# Patient Record
Sex: Male | Born: 1965 | Race: White | Hispanic: No | Marital: Married | Smoking: Former smoker
Health system: Southern US, Community
[De-identification: ages and names within clinical notes are randomized; demographics above are authoritative.]

## PROBLEM LIST (undated history)

## (undated) DIAGNOSIS — E7800 Pure hypercholesterolemia, unspecified: Secondary | ICD-10-CM

## (undated) DIAGNOSIS — T753XXA Motion sickness, initial encounter: Secondary | ICD-10-CM

## (undated) DIAGNOSIS — N200 Calculus of kidney: Secondary | ICD-10-CM

## (undated) DIAGNOSIS — M25519 Pain in unspecified shoulder: Secondary | ICD-10-CM

## (undated) DIAGNOSIS — G629 Polyneuropathy, unspecified: Secondary | ICD-10-CM

## (undated) DIAGNOSIS — F419 Anxiety disorder, unspecified: Secondary | ICD-10-CM

## (undated) DIAGNOSIS — Z87442 Personal history of urinary calculi: Secondary | ICD-10-CM

## (undated) DIAGNOSIS — M1990 Unspecified osteoarthritis, unspecified site: Secondary | ICD-10-CM

## (undated) DIAGNOSIS — I10 Essential (primary) hypertension: Secondary | ICD-10-CM

## (undated) DIAGNOSIS — J45909 Unspecified asthma, uncomplicated: Secondary | ICD-10-CM

## (undated) HISTORY — DX: Pain in unspecified shoulder: M25.519

## (undated) HISTORY — PX: JOINT REPLACEMENT: SHX530

## (undated) HISTORY — PX: TOTAL SHOULDER ARTHROPLASTY: SHX126

---

## 1999-03-31 ENCOUNTER — Encounter: Payer: Self-pay | Admitting: Neurosurgery

## 1999-04-01 ENCOUNTER — Inpatient Hospital Stay (HOSPITAL_COMMUNITY): Admission: RE | Admit: 1999-04-01 | Discharge: 1999-04-02 | Payer: Self-pay | Admitting: Neurosurgery

## 1999-04-01 ENCOUNTER — Encounter: Payer: Self-pay | Admitting: Neurosurgery

## 1999-08-03 ENCOUNTER — Encounter: Admission: RE | Admit: 1999-08-03 | Discharge: 1999-08-03 | Payer: Self-pay | Admitting: Neurosurgery

## 1999-08-03 ENCOUNTER — Encounter: Payer: Self-pay | Admitting: Neurosurgery

## 1999-09-20 HISTORY — PX: CERVICAL FUSION: SHX112

## 1999-11-05 ENCOUNTER — Encounter: Payer: Self-pay | Admitting: Neurosurgery

## 1999-11-05 ENCOUNTER — Encounter: Admission: RE | Admit: 1999-11-05 | Discharge: 1999-11-05 | Payer: Self-pay | Admitting: Neurosurgery

## 2000-04-11 ENCOUNTER — Ambulatory Visit (HOSPITAL_COMMUNITY): Admission: RE | Admit: 2000-04-11 | Discharge: 2000-04-11 | Payer: Self-pay | Admitting: Neurosurgery

## 2000-04-11 ENCOUNTER — Encounter: Payer: Self-pay | Admitting: Neurosurgery

## 2000-09-19 HISTORY — PX: LITHOTRIPSY: SUR834

## 2000-09-30 ENCOUNTER — Encounter: Payer: Self-pay | Admitting: Neurosurgery

## 2000-09-30 ENCOUNTER — Ambulatory Visit (HOSPITAL_COMMUNITY): Admission: RE | Admit: 2000-09-30 | Discharge: 2000-09-30 | Payer: Self-pay | Admitting: Neurosurgery

## 2003-09-20 DIAGNOSIS — K429 Umbilical hernia without obstruction or gangrene: Secondary | ICD-10-CM

## 2003-09-20 HISTORY — DX: Umbilical hernia without obstruction or gangrene: K42.9

## 2003-09-20 HISTORY — PX: HERNIA REPAIR: SHX51

## 2004-07-07 ENCOUNTER — Emergency Department: Payer: Self-pay | Admitting: Emergency Medicine

## 2005-07-13 ENCOUNTER — Emergency Department: Payer: Self-pay | Admitting: Emergency Medicine

## 2007-03-06 ENCOUNTER — Ambulatory Visit: Payer: Self-pay | Admitting: Emergency Medicine

## 2008-02-20 ENCOUNTER — Emergency Department: Payer: Self-pay | Admitting: Emergency Medicine

## 2008-04-29 ENCOUNTER — Ambulatory Visit: Payer: Self-pay | Admitting: Otolaryngology

## 2008-10-23 ENCOUNTER — Emergency Department: Payer: Self-pay | Admitting: Emergency Medicine

## 2008-10-25 ENCOUNTER — Emergency Department: Payer: Self-pay | Admitting: Emergency Medicine

## 2010-09-19 HISTORY — PX: LIPOMA EXCISION: SHX5283

## 2011-12-26 DIAGNOSIS — N23 Unspecified renal colic: Secondary | ICD-10-CM | POA: Insufficient documentation

## 2012-04-11 DIAGNOSIS — D179 Benign lipomatous neoplasm, unspecified: Secondary | ICD-10-CM | POA: Insufficient documentation

## 2012-06-25 ENCOUNTER — Emergency Department: Payer: Self-pay | Admitting: Emergency Medicine

## 2013-01-07 ENCOUNTER — Emergency Department: Payer: Self-pay | Admitting: Emergency Medicine

## 2013-01-17 DIAGNOSIS — G894 Chronic pain syndrome: Secondary | ICD-10-CM | POA: Insufficient documentation

## 2013-01-17 DIAGNOSIS — N200 Calculus of kidney: Secondary | ICD-10-CM | POA: Insufficient documentation

## 2013-01-17 DIAGNOSIS — R479 Unspecified speech disturbances: Secondary | ICD-10-CM | POA: Insufficient documentation

## 2013-01-28 DIAGNOSIS — M7520 Bicipital tendinitis, unspecified shoulder: Secondary | ICD-10-CM | POA: Insufficient documentation

## 2013-01-28 DIAGNOSIS — M7550 Bursitis of unspecified shoulder: Secondary | ICD-10-CM | POA: Insufficient documentation

## 2013-02-10 ENCOUNTER — Emergency Department: Payer: Self-pay | Admitting: Emergency Medicine

## 2013-02-26 ENCOUNTER — Ambulatory Visit: Payer: Self-pay

## 2013-03-07 ENCOUNTER — Ambulatory Visit: Payer: Self-pay | Admitting: Urology

## 2013-08-29 DIAGNOSIS — M792 Neuralgia and neuritis, unspecified: Secondary | ICD-10-CM | POA: Insufficient documentation

## 2014-03-20 DIAGNOSIS — M19111 Post-traumatic osteoarthritis, right shoulder: Secondary | ICD-10-CM | POA: Insufficient documentation

## 2014-09-19 HISTORY — PX: JOINT REPLACEMENT: SHX530

## 2014-11-12 ENCOUNTER — Ambulatory Visit: Payer: Self-pay | Admitting: Surgery

## 2014-12-11 ENCOUNTER — Ambulatory Visit: Payer: Self-pay | Admitting: Surgery

## 2014-12-11 LAB — URINALYSIS, COMPLETE
Bacteria: NONE SEEN
Bilirubin,UR: NEGATIVE
Blood: NEGATIVE
Glucose,UR: NEGATIVE mg/dL (ref 0–75)
Ketone: NEGATIVE
Leukocyte Esterase: NEGATIVE
Nitrite: NEGATIVE
Ph: 6 (ref 4.5–8.0)
Protein: NEGATIVE
RBC,UR: 1 /HPF (ref 0–5)
Specific Gravity: 1.013 (ref 1.003–1.030)
Squamous Epithelial: NONE SEEN
WBC UR: NONE SEEN /HPF (ref 0–5)

## 2014-12-11 LAB — PROTIME-INR
INR: 0.9
PROTHROMBIN TIME: 12.1 s

## 2014-12-11 LAB — BASIC METABOLIC PANEL
Anion Gap: 11 (ref 7–16)
BUN: 15 mg/dL
CREATININE: 0.87 mg/dL
Calcium, Total: 9.7 mg/dL
Chloride: 101 mmol/L
Co2: 28 mmol/L
GLUCOSE: 113 mg/dL — AB
Potassium: 3.2 mmol/L — ABNORMAL LOW
Sodium: 140 mmol/L

## 2014-12-11 LAB — CBC
HCT: 43.8 % (ref 40.0–52.0)
HGB: 14.6 g/dL (ref 13.0–18.0)
MCH: 31.8 pg (ref 26.0–34.0)
MCHC: 33.3 g/dL (ref 32.0–36.0)
MCV: 95 fL (ref 80–100)
Platelet: 244 10*3/uL (ref 150–440)
RBC: 4.59 10*6/uL (ref 4.40–5.90)
RDW: 13.1 % (ref 11.5–14.5)
WBC: 5.9 10*3/uL (ref 3.8–10.6)

## 2014-12-11 LAB — MRSA PCR SCREENING

## 2014-12-19 HISTORY — PX: TOTAL SHOULDER ARTHROPLASTY: SHX126

## 2014-12-25 ENCOUNTER — Inpatient Hospital Stay
Admit: 2014-12-25 | Disposition: A | Discharge status: Home / Self Care | Payer: Self-pay | Attending: Surgery | Admitting: Surgery

## 2014-12-25 DIAGNOSIS — Z96611 Presence of right artificial shoulder joint: Secondary | ICD-10-CM | POA: Insufficient documentation

## 2014-12-25 HISTORY — PX: SHOULDER SURGERY: SHX246

## 2014-12-26 LAB — BASIC METABOLIC PANEL
Anion Gap: 6 — ABNORMAL LOW (ref 7–16)
BUN: 16 mg/dL
CO2: 29 mmol/L
CREATININE: 0.89 mg/dL
Calcium, Total: 8.3 mg/dL — ABNORMAL LOW
Chloride: 103 mmol/L
EGFR (African American): >60
EGFR (Non-African Amer.): >60
GLUCOSE: 139 mg/dL — AB
Potassium: 3.4 mmol/L — ABNORMAL LOW
Sodium: 138 mmol/L

## 2014-12-26 LAB — CBC WITH DIFFERENTIAL/PLATELET
BASOS ABS: 0 10*3/uL (ref 0.0–0.1)
Basophil %: 0.1 %
EOS ABS: 0 10*3/uL (ref 0.0–0.7)
Eosinophil %: 0.1 %
HCT: 35.6 % — ABNORMAL LOW (ref 40.0–52.0)
HGB: 11.9 g/dL — AB (ref 13.0–18.0)
LYMPHS ABS: 1.6 10*3/uL (ref 1.0–3.6)
LYMPHS PCT: 12.4 %
MCH: 31.9 pg (ref 26.0–34.0)
MCHC: 33.4 g/dL (ref 32.0–36.0)
MCV: 96 fL (ref 80–100)
Monocyte #: 0.9 x10 3/mm (ref 0.2–1.0)
Monocyte %: 6.8 %
NEUTROS PCT: 80.6 %
Neutrophil #: 10.5 10*3/uL — ABNORMAL HIGH (ref 1.4–6.5)
Platelet: 190 10*3/uL (ref 150–440)
RBC: 3.73 10*6/uL — ABNORMAL LOW (ref 4.40–5.90)
RDW: 13.1 % (ref 11.5–14.5)
WBC: 13 10*3/uL — ABNORMAL HIGH (ref 3.8–10.6)

## 2015-01-12 LAB — SURGICAL PATHOLOGY

## 2015-01-18 NOTE — Op Note (Signed)
PATIENT NAME:  Stephen Ray, Stephen Ray MR#:  [id number] DATE OF BIRTH:  [birth year]  DATE OF PROCEDURE:  12/25/2014  PREOPERATIVE DIAGNOSIS: Advanced degenerative joint disease, right shoulder.   POSTOPERATIVE DIAGNOSIS:   Advanced degenerative joint disease, right shoulder.    PROCEDURE: Right shoulder hemiarthroplasty with curettage/debridement of glenoid cyst, right shoulder.   SURGEON:  Pascal Lux, MD   ASSISTANT: Reche Dixon, PA-C   ANESTHESIA: General endotracheal with an interscalene block placed preoperative by the anesthesiologist.   FINDINGS: As noted above.  The glenoid was eburnated but had a smooth concentric curvature. There was significant labral degenerative tearing which was debrided. The rotator cuff itself was in excellent condition.   COMPLICATIONS: None.   ESTIMATED BLOOD LOSS: 150 mL.   TOTAL FLUIDS: 1400 mL of crystalloid.   URINE OUTPUT: 300 mL.   TOURNIQUET: None.   DRAINS: None.   CLOSURE: Staples.   BRIEF CLINICAL NOTE: The patient is a 49 year old male with an 8-10 year history of progressively worsening right shoulder pain. His symptoms have persisted despite medications, activity modification, injections, therapy, etc. His history and examination were consistent with significant degenerative joint disease, confirmed by both plain radiographs and an MRI scan. The MRI scan showed no significant partial or full-thickness rotator cuff tears. He presents at this time for a right shoulder hemiarthroplasty with possible reaming/microfracturing of the glenoid.  PROCEDURE IN DETAIL: The patient underwent placement of an interscalene block in the preoperative holding area. He was then brought into the operating room and lain in the supine position. After adequate general endotracheal intubation and anesthesia were obtained, the patient was repositioned in the beach chair position using the beach chair positioner. The right shoulder and upper extremity were prepped  with ChloraPrep solution before being draped sterilely. Preoperative antibiotics were administered. The standard anterior approach to the shoulder was made through an approximately 4 to  5 inch incision. This incision began at the coracoid process and extended distally and laterally in line with the anterior margin of the deltoid. The incision was carried down through the subcutaneous tissues to expose the deltopectoral fascia. The interval between the deltoid and pectoralis muscles was developed. The  cephalic vein was identified and retracted laterally with the deltoid. The conjoined tendon was identified and retracted medially to expose the anterior aspect of the shoulder. The "three sisters" were identified and cauterized before the subscapularis tendon was released approximately 1 cm from its attachment to the lesser tuberosity. Several tagging sutures were placed in the tendon before it was retracted medially to better expose the joint. Extensive capsular releases were performed superiorly, anteriorly, and inferiorly. Care was taken to avoid injury to the axillary nerve. The glenoid was carefully inspected with the findings as described above. Both visually and by palpation, it appeared quite smooth and had a single radius of curvature. The areas of degenerative tearing of the labrum were debrided back to stable margins using a #15 blade or Metzenbaum scissors. Inferiorly, there was a cyst in the glenoid measuring approximately 8 x 9 mm, which was curetted out. As it was noncontained, it was elected not to proceed with bone grafting. Because of the difficulty with exposure, it was felt best to proceed with a micro stem for the proximal humerus rather than a humeral resurfacing procedure. The external guide was positioned and the appropriate proximal humerus cut made. This piece was taken to the back table, where it was measured and found to be  optimally replicated by a 46 x  21 mm humeral head. After removal  of the humeral head, the glenoid could be better exposed, permitting  completion of the glenoid preparation.   Attention was directed to the humeral side. The humeral canal was identified using a narrow tapered reamer inserted just posterior to the bicipital groove. The canal was reamed sequentially to a 17 mm diameter reamer. The canal was then broached sequentially beginning with a #14 broach and progressing to a #17 broach. This was left in place and a trial reduction performed using the 46 x 21 mm head. This demonstrated excellent stability to anterior and posterior displacement, as well as to abduction and external rotation, adduction across the chest to reach the opposite shoulder, and full internal rotation. The patient's hand also could easily reach his head and across his face.  There was no excess laxity in the shoulder. The trial components were removed and the permanent #17 stem impacted into place with care taken to maintain the appropriate retroversion. Of note, all of the cuts and broaching was done with approximately 30 degrees of retroversion. The permanent head and adapter construct was put together on the back table before it too was impacted into place. The Morse taper locking mechanism was verified using manual traction and found to be excellent. The  head was relocated and the shoulder again placed through a range of motion with the findings as described above. No crepitus or clunking was noted. The wound was copiously irrigated with bacitracin saline solution via the jet lavage system. The periarticular tissues and peri-incisional tissues were debrided using 30 mL of 0.5% Sensorcaine and 20 mL of Exparel diluted out to 60 mL with normal saline in order to help with postoperative analgesia. The subscapularis tendon was reapproximated using #2 FiberWire interrupted sutures. The deltopectoral interval was reapproximated using 2-0 Vicryl interrupted sutures before 1 gm of tranexamic acid in 10  mL of normal saline was injected intra-articularly. The subcutaneous tissues were closed in 2 layers using 2-0 Vicryl interrupted sutures before the skin was closed using staples. A sterile occlusive dressing was applied to the shoulder before the arm was placed into a shoulder immobilizer. A Polar Pack also was applied to the shoulder. The  patient was then awakened, extubated, and returned to the recovery room in satisfactory condition after tolerating the procedure well. Of note, my assistant was present for the entire case. His presence was critical for assistance with exposure and arm positioning in order to optimize the placement of the components.    ____________________________ J. Dorien Chihuahua, MD jjp:tr D: 12/25/2014 13:16:05 ET T: 12/25/2014 16:00:00 ET JOB#: [id number]  cc: Pascal Lux, MD, <Dictator> Pascal Lux MD ELECTRONICALLY SIGNED 01/06/2015 14:44

## 2015-02-21 ENCOUNTER — Other Ambulatory Visit: Payer: Self-pay | Admitting: Family Medicine

## 2015-03-05 ENCOUNTER — Telehealth: Payer: Self-pay | Admitting: Family Medicine

## 2015-03-05 NOTE — Telephone Encounter (Signed)
Pt called requesting to speak to the nurse concerning medication changes.

## 2015-03-05 NOTE — Telephone Encounter (Signed)
Would like to stop Requip and go back on Gabapentin and Duloxitine. Restless leg is done and he does not need that med but needs the others to be reinstated for now. [facility]

## 2015-03-06 NOTE — Telephone Encounter (Signed)
I probably need to see him to figure this out.  Cannot just restart both at the same time. Restless leg syndrome just doesn't go away.  Figure that he figured that he really didn't really have restless leg problems.

## 2015-03-06 NOTE — Telephone Encounter (Signed)
No answer.[facility]

## 2015-03-10 ENCOUNTER — Other Ambulatory Visit: Payer: Self-pay | Admitting: Surgery

## 2015-03-10 DIAGNOSIS — M19012 Primary osteoarthritis, left shoulder: Secondary | ICD-10-CM

## 2015-03-10 NOTE — Telephone Encounter (Signed)
I have called a few times and can not reach this patient. I am unable to leave a message. Will await a call back. [facility]

## 2015-03-16 ENCOUNTER — Ambulatory Visit
Admission: RE | Admit: 2015-03-16 | Discharge: 2015-03-16 | Disposition: A | Discharge status: Home / Self Care | Payer: BC Managed Care – PPO | Source: Ambulatory Visit | Attending: Surgery | Admitting: Surgery

## 2015-03-16 DIAGNOSIS — M19012 Primary osteoarthritis, left shoulder: Secondary | ICD-10-CM | POA: Diagnosis not present

## 2015-03-16 DIAGNOSIS — M67814 Other specified disorders of tendon, left shoulder: Secondary | ICD-10-CM | POA: Diagnosis not present

## 2015-03-16 DIAGNOSIS — M24012 Loose body in left shoulder: Secondary | ICD-10-CM | POA: Insufficient documentation

## 2015-03-16 DIAGNOSIS — M25412 Effusion, left shoulder: Secondary | ICD-10-CM | POA: Insufficient documentation

## 2015-03-18 DIAGNOSIS — M7021 Olecranon bursitis, right elbow: Secondary | ICD-10-CM | POA: Insufficient documentation

## 2015-03-18 DIAGNOSIS — M19112 Post-traumatic osteoarthritis, left shoulder: Secondary | ICD-10-CM | POA: Insufficient documentation

## 2015-03-20 ENCOUNTER — Ambulatory Visit: Payer: Self-pay | Admitting: Family Medicine

## 2015-03-30 ENCOUNTER — Encounter: Payer: Self-pay | Admitting: Family Medicine

## 2015-03-30 ENCOUNTER — Ambulatory Visit (INDEPENDENT_AMBULATORY_CARE_PROVIDER_SITE_OTHER): Payer: BC Managed Care – PPO | Admitting: Family Medicine

## 2015-03-30 VITALS — BP 130/80 | HR 65 | Resp 16 | Ht 70.0 in | Wt 206.0 lb

## 2015-03-30 DIAGNOSIS — Z9889 Other specified postprocedural states: Secondary | ICD-10-CM | POA: Insufficient documentation

## 2015-03-30 DIAGNOSIS — F419 Anxiety disorder, unspecified: Secondary | ICD-10-CM

## 2015-03-30 DIAGNOSIS — I10 Essential (primary) hypertension: Secondary | ICD-10-CM

## 2015-03-30 DIAGNOSIS — M069 Rheumatoid arthritis, unspecified: Secondary | ICD-10-CM | POA: Insufficient documentation

## 2015-03-30 DIAGNOSIS — M792 Neuralgia and neuritis, unspecified: Secondary | ICD-10-CM

## 2015-03-30 DIAGNOSIS — F1110 Opioid abuse, uncomplicated: Secondary | ICD-10-CM | POA: Insufficient documentation

## 2015-03-30 DIAGNOSIS — N200 Calculus of kidney: Secondary | ICD-10-CM | POA: Insufficient documentation

## 2015-03-30 DIAGNOSIS — M19011 Primary osteoarthritis, right shoulder: Secondary | ICD-10-CM

## 2015-03-30 MED ORDER — DULOXETINE HCL 30 MG PO CPEP: 30.0000 mg | ORAL_CAPSULE | Freq: Every day | ORAL | Status: DC

## 2015-03-30 NOTE — Patient Instructions (Signed)
Cont. To see Ortho re: shoulder problems and his pain medications.

## 2015-03-30 NOTE — Progress Notes (Signed)
Name: Stephen Ray   MRN: [id number]    DOB: Apr 14, 1966   Date:03/30/2015       Progress Note  Subjective  Chief Complaint  Chief Complaint  Patient presents with  . Shoulder Pain    Following for opiate withdrawl and restless leg: Patient reports having taken 4mg  Suboxone to get done with withdrawl and had zero leg cramps.     HPI  Here for f/u.  Did not take Requip after several days.  Did no good.  Got some Suboxone from a "friend" and the nervous legs went away.  Now only on Tramadol bid and Etodolac and Gabapentin 300 mg bid.  Also has HBP.  Has some chronic pain in L. Shoulder.  In need of that shouder surgery also. S/p R. Total shoulder repair (12/25/14).  Past Medical History  Diagnosis Date  . Shoulder pain     left and right    History  Substance Use Topics  . Smoking status: Never Smoker   . Smokeless tobacco: Never Used  . Alcohol Use: 0.0 oz/week    0 Standard drinks or equivalent per week     Current outpatient prescriptions:  .  chlorthalidone (HYGROTON) 25 MG tablet, Take by mouth., Disp: , Rfl:  .  gabapentin (NEURONTIN) 300 MG capsule, Take one capsule by mouth 3 times a day. May increase to 2 tablets at night as needed., Disp: , Rfl:  .  lidocaine (LIDODERM) 5 %, Place onto the skin., Disp: , Rfl:  .  lisinopril (PRINIVIL,ZESTRIL) 20 MG tablet, Take by mouth., Disp: , Rfl:  .  traMADol (ULTRAM) 50 MG tablet, Take by mouth., Disp: , Rfl:  .  DULoxetine (CYMBALTA) 30 MG capsule, Take 1 capsule (30 mg total) by mouth daily., Disp: 30 capsule, Rfl: 6 .  etodolac (LODINE) 500 MG tablet, Take by mouth., Disp: , Rfl:  .  naproxen sodium (ANAPROX) 550 MG tablet, Take by mouth., Disp: , Rfl:  .  oxyCODONE (OXY IR/ROXICODONE) 5 MG immediate release tablet, 1 p.o. q.8 hours p.r.n. pain.  Max = 3/day., Disp: , Rfl:  .  oxyCODONE-acetaminophen (PERCOCET/ROXICET) 5-325 MG per tablet, 1 or 2 p.o. q.6 hours p.r.n. pain, Disp: , Rfl:  .  rOPINIRole (REQUIP) 0.25 MG tablet,  Take by mouth., Disp: , Rfl:   Allergies  Allergen Reactions  . Shellfish Allergy Anaphylaxis  . Shellfish-Derived Products Anaphylaxis    Review of Systems  Constitutional: Negative.  Negative for fever and chills.  Eyes: Negative.  Negative for blurred vision and double vision.  Respiratory: Negative for cough, sputum production, shortness of breath and wheezing.   Cardiovascular: Negative for chest pain, palpitations, orthopnea and claudication.  Gastrointestinal: Negative.  Negative for heartburn, nausea, vomiting, abdominal pain, diarrhea, constipation and blood in stool.  Genitourinary: Negative.  Negative for dysuria and frequency.  Musculoskeletal: Positive for joint pain (Bilateral shoulder pain).  Skin: Negative.  Negative for rash.  Neurological: Negative for headaches.      Objective  Filed Vitals:   03/30/15 1312 03/30/15 1343  BP: 147/96 130/80  Pulse: 65   Resp: 16   Height: 5\' 10"  (1.778 m)   Weight: 206 lb (93.441 kg)      Physical Exam  Constitutional: He is oriented to person, place, and time and well-developed, well-nourished, and in no distress.  HENT:  Head: Normocephalic and atraumatic.  Eyes: Conjunctivae and EOM are normal. Pupils are equal, round, and reactive to light.  Neck: Normal range of motion. No  thyromegaly present.  Cardiovascular: Normal rate, regular rhythm, normal heart sounds and intact distal pulses.  Exam reveals no gallop and no friction rub.   No murmur heard. Pulmonary/Chest: Effort normal and breath sounds normal. No respiratory distress. He has no wheezes. He has no rales.  Abdominal: Soft. Bowel sounds are normal. He exhibits no mass. There is no tenderness.  Musculoskeletal: He exhibits no edema.  Lymphadenopathy:    He has no cervical adenopathy.  Neurological: He is alert and oriented to person, place, and time.  Psychiatric:  Affect sl. Anxious.  Vitals reviewed.       Assessment & Plan   1. Essential  (primary) hypertension   2. Primary osteoarthritis of right shoulder   3. Neuropathic pain   4. Acute anxiety  -Start Duloxitine, 30 mg. 1 daily.

## 2015-05-26 ENCOUNTER — Encounter: Payer: Self-pay | Admitting: Family Medicine

## 2015-05-26 ENCOUNTER — Ambulatory Visit (INDEPENDENT_AMBULATORY_CARE_PROVIDER_SITE_OTHER): Payer: BC Managed Care – PPO | Admitting: Family Medicine

## 2015-05-26 VITALS — BP 150/95 | HR 69 | Temp 98.5°F | Resp 16 | Ht 70.0 in | Wt 205.0 lb

## 2015-05-26 DIAGNOSIS — Z23 Encounter for immunization: Secondary | ICD-10-CM | POA: Diagnosis not present

## 2015-05-26 DIAGNOSIS — I10 Essential (primary) hypertension: Secondary | ICD-10-CM | POA: Diagnosis not present

## 2015-05-26 MED ORDER — LISINOPRIL 30 MG PO TABS: 30.0000 mg | ORAL_TABLET | Freq: Every day | ORAL | Status: DC

## 2015-05-26 NOTE — Progress Notes (Signed)
Name: Stephen Ray   MRN: [id number]    DOB: [birth year]   Date:05/26/2015       Progress Note  Subjective  Chief Complaint  Chief Complaint  Patient presents with  . Hypertension    follow up for blood pressure 3 month     HPI  Here for f/u of HBP.  States that BP higher when he has more pain.  Taking meds daily. No problem-specific assessment & plan notes found for this encounter.   Past Medical History  Diagnosis Date  . Shoulder pain     left and right    Social History  Substance Use Topics  . Smoking status: Never Smoker   . Smokeless tobacco: Never Used  . Alcohol Use: 0.0 oz/week    0 Standard drinks or equivalent per week     Current outpatient prescriptions:  .  chlorthalidone (HYGROTON) 25 MG tablet, Take by mouth., Disp: , Rfl:  .  DULoxetine (CYMBALTA) 30 MG capsule, Take 1 capsule (30 mg total) by mouth daily., Disp: 30 capsule, Rfl: 6 .  gabapentin (NEURONTIN) 300 MG capsule, Take one capsule by mouth 3 times a day. May increase to 2 tablets at night as needed., Disp: , Rfl:  .  lidocaine (LIDODERM) 5 %, Place onto the skin., Disp: , Rfl:  .  lisinopril (PRINIVIL,ZESTRIL) 20 MG tablet, Take by mouth., Disp: , Rfl:   Allergies  Allergen Reactions  . Shellfish Allergy Anaphylaxis  . Shellfish-Derived Products Anaphylaxis    Review of Systems  Constitutional: Negative for fever and chills.  HENT: Negative for hearing loss.   Eyes: Negative for blurred vision and double vision.  Respiratory: Negative for cough, sputum production, shortness of breath and wheezing.   Cardiovascular: Negative for chest pain, palpitations and leg swelling.  Gastrointestinal: Negative for heartburn, nausea, vomiting, abdominal pain, diarrhea and blood in stool.  Genitourinary: Negative for dysuria, urgency and frequency.  Musculoskeletal: Positive for joint pain (bilateral shoulders.).  Skin: Negative for rash.  Neurological: Negative for dizziness, sensory change, focal  weakness and headaches.      Objective  Filed Vitals:   05/26/15 1305  BP: 157/115  Pulse: 69  Temp: 98.5 F (36.9 C)  TempSrc: Oral  Resp: 16  Height: 5\' 10"  (1.778 m)  Weight: 205 lb (92.987 kg)     Physical Exam  Constitutional: He is well-developed, well-nourished, and in no distress. No distress.  HENT:  Head: Normocephalic and atraumatic.  Eyes: Conjunctivae and EOM are normal. Pupils are equal, round, and reactive to light. No scleral icterus.  Neck: Normal range of motion. Neck supple. Carotid bruit is not present. No thyromegaly present.  Cardiovascular: Normal rate, regular rhythm, normal heart sounds and intact distal pulses.  Exam reveals no gallop and no friction rub.   No murmur heard. Pulmonary/Chest: Effort normal and breath sounds normal. No respiratory distress. He has no wheezes. He has no rales.  Abdominal: Soft. Bowel sounds are normal. He exhibits no distension, no abdominal bruit and no mass. There is no tenderness.  Musculoskeletal: He exhibits no edema.  Lymphadenopathy:    He has no cervical adenopathy.  Vitals reviewed.     No results found for this or any previous visit (from the past 2160 hour(s)).   Assessment & Plan  1. Essential (primary) hypertension  - lisinopril (PRINIVIL,ZESTRIL) 30 MG tablet; Take 1 tablet (30 mg total) by mouth daily.  Dispense: 30 tablet; Refill: 6  2. Need for influenza vaccination  -  Flu Vaccine QUAD 36+ mos PF IM (Fluarix & Fluzone Quad PF)

## 2015-05-26 NOTE — Patient Instructions (Signed)
Continue other meds as directed.

## 2015-08-04 ENCOUNTER — Other Ambulatory Visit: Payer: BC Managed Care – PPO

## 2015-08-05 ENCOUNTER — Encounter
Admission: RE | Admit: 2015-08-05 | Discharge: 2015-08-05 | Disposition: A | Discharge status: Home / Self Care | Payer: BC Managed Care – PPO | Source: Ambulatory Visit | Attending: Surgery | Admitting: Surgery

## 2015-08-05 DIAGNOSIS — Z01812 Encounter for preprocedural laboratory examination: Secondary | ICD-10-CM | POA: Diagnosis not present

## 2015-08-05 HISTORY — DX: Unspecified osteoarthritis, unspecified site: M19.90

## 2015-08-05 HISTORY — DX: Anxiety disorder, unspecified: F41.9

## 2015-08-05 HISTORY — DX: Essential (primary) hypertension: I10

## 2015-08-05 LAB — SURGICAL PCR SCREEN
MRSA, PCR: NEGATIVE
Staphylococcus aureus: NEGATIVE

## 2015-08-05 LAB — URINALYSIS COMPLETE WITH MICROSCOPIC (ARMC ONLY)
Bacteria, UA: NONE SEEN
Bilirubin Urine: NEGATIVE
Glucose, UA: NEGATIVE mg/dL
HGB URINE DIPSTICK: NEGATIVE
KETONES UR: NEGATIVE mg/dL
Leukocytes, UA: NEGATIVE
NITRITE: NEGATIVE
Protein, ur: NEGATIVE mg/dL
SPECIFIC GRAVITY, URINE: 1.014 (ref 1.005–1.030)
Squamous Epithelial / LPF: NONE SEEN
pH: 6 (ref 5.0–8.0)

## 2015-08-05 LAB — CBC
HCT: 42.6 % (ref 40.0–52.0)
HEMOGLOBIN: 14.6 g/dL (ref 13.0–18.0)
MCH: 32.1 pg (ref 26.0–34.0)
MCHC: 34.2 g/dL (ref 32.0–36.0)
MCV: 93.7 fL (ref 80.0–100.0)
Platelets: 237 10*3/uL (ref 150–440)
RBC: 4.54 MIL/uL (ref 4.40–5.90)
RDW: 13.4 % (ref 11.5–14.5)
WBC: 4.7 10*3/uL (ref 3.8–10.6)

## 2015-08-05 LAB — BASIC METABOLIC PANEL
Anion gap: 5 (ref 5–15)
BUN: 15 mg/dL (ref 6–20)
CO2: 31 mmol/L (ref 22–32)
CREATININE: 0.88 mg/dL (ref 0.61–1.24)
Calcium: 9.3 mg/dL (ref 8.9–10.3)
Chloride: 101 mmol/L (ref 101–111)
GFR calc non Af Amer: >60 mL/min (ref >60)
Glucose, Bld: 98 mg/dL (ref 65–99)
Potassium: 3.2 mmol/L — ABNORMAL LOW (ref 3.5–5.1)
SODIUM: 137 mmol/L (ref 135–145)

## 2015-08-05 LAB — TYPE AND SCREEN
ABO/RH(D): O POS
Antibody Screen: NEGATIVE

## 2015-08-05 LAB — PROTIME-INR
INR: 0.89
PROTHROMBIN TIME: 12.3 s (ref 11.4–15.0)

## 2015-08-05 LAB — ABO/RH: ABO/RH(D): O POS

## 2015-08-05 NOTE — Patient Instructions (Signed)
  Your procedure is scheduled on: Thursday 08/20/2015 Report to Day Surgery. 2ND FLOOR MEDICAL MALL ENTRANCE To find out your arrival time please call [phone] between 1PM - 3PM on Wednesday 08/19/2015.  Remember: Instructions that are not followed completely may result in serious medical risk, up to and including death, or upon the discretion of your surgeon and anesthesiologist your surgery may need to be rescheduled.    __X__ 1. Do not eat food or drink liquids after midnight. No gum chewing or hard candies.     __X__ 2. No Alcohol for 24 hours before or after surgery.   ____ 3. Bring all medications with you on the day of surgery if instructed.    __X_ 4. Notify your doctor if there is any change in your medical condition     (cold, fever, infections).     Do not wear jewelry, make-up, hairpins, clips or nail polish.  Do not wear lotions, powders, or perfumes.   Do not shave 48 hours prior to surgery. Men may shave face and neck.  Do not bring valuables to the hospital.    [facility] is not responsible for any belongings or valuables.               Contacts, dentures or bridgework may not be worn into surgery.  Leave your suitcase in the car. After surgery it may be brought to your room.  For patients admitted to the hospital, discharge time is determined by your                treatment team.   Patients discharged the day of surgery will not be allowed to drive home.   Please read over the following fact sheets that you were given:   MRSA Information and Surgical Site Infection Prevention   __X__ Take these medicines the morning of surgery with A SIP OF WATER:    1. CHLORTHALIDONE  2. DULOXETINE  3. GABAPENTIN  4. LISINOPRIL  5.  6.  ____ Fleet Enema (as directed)   __X__ Use CHG Soap as directed  ____ Use inhalers on the day of surgery  ____ Stop metformin 2 days prior to surgery    ____ Take 1/2 of usual insulin dose the night before surgery and none on the  morning of surgery.   ____ Stop Coumadin/Plavix/aspirin on  ____ Stop Anti-inflammatories on    ____ Stop supplements until after surgery.    ____ Bring C-Pap to the hospital.

## 2015-08-06 NOTE — Pre-Procedure Instructions (Signed)
Patient does not want a foley

## 2015-08-07 DIAGNOSIS — E876 Hypokalemia: Secondary | ICD-10-CM | POA: Insufficient documentation

## 2015-08-20 ENCOUNTER — Encounter
Admission: RE | Disposition: A | Discharge status: Home / Self Care | Payer: Self-pay | Source: Ambulatory Visit | Attending: Surgery

## 2015-08-20 ENCOUNTER — Inpatient Hospital Stay: Payer: BC Managed Care – PPO | Admitting: Anesthesiology

## 2015-08-20 ENCOUNTER — Inpatient Hospital Stay
Admission: RE | Admit: 2015-08-20 | Discharge: 2015-08-21 | DRG: 483 | Disposition: A | Discharge status: Home / Self Care | Payer: BC Managed Care – PPO | Source: Ambulatory Visit | Attending: Surgery | Admitting: Surgery

## 2015-08-20 ENCOUNTER — Inpatient Hospital Stay: Payer: BC Managed Care – PPO

## 2015-08-20 ENCOUNTER — Encounter: Payer: Self-pay | Admitting: *Deleted

## 2015-08-20 DIAGNOSIS — Z91013 Allergy to seafood: Secondary | ICD-10-CM

## 2015-08-20 DIAGNOSIS — Z79899 Other long term (current) drug therapy: Secondary | ICD-10-CM | POA: Diagnosis not present

## 2015-08-20 DIAGNOSIS — M19112 Post-traumatic osteoarthritis, left shoulder: Secondary | ICD-10-CM | POA: Diagnosis present

## 2015-08-20 DIAGNOSIS — G8929 Other chronic pain: Secondary | ICD-10-CM | POA: Diagnosis present

## 2015-08-20 DIAGNOSIS — Z96612 Presence of left artificial shoulder joint: Secondary | ICD-10-CM | POA: Insufficient documentation

## 2015-08-20 DIAGNOSIS — I10 Essential (primary) hypertension: Secondary | ICD-10-CM | POA: Diagnosis present

## 2015-08-20 HISTORY — PX: SHOULDER HEMI-ARTHROPLASTY: SHX5049

## 2015-08-20 LAB — POCT I-STAT 4, (NA,K, GLUC, HGB,HCT)
Glucose, Bld: 125 mg/dL — ABNORMAL HIGH (ref 65–99)
HEMATOCRIT: 46 % (ref 39.0–52.0)
HEMOGLOBIN: 15.6 g/dL (ref 13.0–17.0)
Potassium: 3.1 mmol/L — ABNORMAL LOW (ref 3.5–5.1)
Sodium: 139 mmol/L (ref 135–145)

## 2015-08-20 SURGERY — HEMIARTHROPLASTY, SHOULDER
Anesthesia: Regional | Site: Shoulder | Laterality: Left | Wound class: Clean

## 2015-08-20 MED ORDER — KETOROLAC TROMETHAMINE 15 MG/ML IJ SOLN
15.0000 mg | Freq: Four times a day (QID) | INTRAMUSCULAR | Status: DC
  Administered 2015-08-20 – 2015-08-21 (×2): 15 mg via INTRAVENOUS
  Filled 2015-08-20 (×2): qty 1

## 2015-08-20 MED ORDER — HYDROMORPHONE HCL 1 MG/ML IJ SOLN
INTRAMUSCULAR | Status: AC
  Filled 2015-08-20: qty 2

## 2015-08-20 MED ORDER — DOCUSATE SODIUM 100 MG PO CAPS
100.0000 mg | ORAL_CAPSULE | Freq: Two times a day (BID) | ORAL | Status: DC
  Administered 2015-08-20 – 2015-08-21 (×2): 100 mg via ORAL
  Filled 2015-08-20 (×2): qty 1

## 2015-08-20 MED ORDER — CEFAZOLIN SODIUM-DEXTROSE 2-3 GM-% IV SOLR
INTRAVENOUS | Status: AC
  Filled 2015-08-20: qty 50

## 2015-08-20 MED ORDER — HYDROMORPHONE HCL 1 MG/ML IJ SOLN
0.2500 mg | INTRAMUSCULAR | Status: DC | PRN
  Administered 2015-08-20 (×4): 0.5 mg via INTRAVENOUS

## 2015-08-20 MED ORDER — ACETAMINOPHEN 10 MG/ML IV SOLN
INTRAVENOUS | Status: AC
  Filled 2015-08-20: qty 100

## 2015-08-20 MED ORDER — FAMOTIDINE 20 MG PO TABS
20.0000 mg | ORAL_TABLET | Freq: Once | ORAL | Status: AC
  Administered 2015-08-20: 20 mg via ORAL

## 2015-08-20 MED ORDER — ACETAMINOPHEN 500 MG PO TABS
1000.0000 mg | ORAL_TABLET | Freq: Four times a day (QID) | ORAL | Status: DC
  Administered 2015-08-20 – 2015-08-21 (×3): 1000 mg via ORAL
  Filled 2015-08-20 (×3): qty 2

## 2015-08-20 MED ORDER — DIPHENHYDRAMINE HCL 12.5 MG/5ML PO ELIX: 12.5000 mg | ORAL_SOLUTION | ORAL | Status: DC | PRN

## 2015-08-20 MED ORDER — ACETAMINOPHEN 650 MG RE SUPP
650.0000 mg | Freq: Four times a day (QID) | RECTAL | Status: DC | PRN
Start: 2015-08-20 — End: 2015-08-21

## 2015-08-20 MED ORDER — FLEET ENEMA 7-19 GM/118ML RE ENEM: 1.0000 | ENEMA | Freq: Once | RECTAL | Status: DC | PRN

## 2015-08-20 MED ORDER — ENOXAPARIN SODIUM 40 MG/0.4ML ~~LOC~~ SOLN
40.0000 mg | SUBCUTANEOUS | Status: DC
  Administered 2015-08-21: 40 mg via SUBCUTANEOUS
  Filled 2015-08-20: qty 0.4

## 2015-08-20 MED ORDER — LIDOCAINE 5 % EX PTCH
1.0000 | MEDICATED_PATCH | CUTANEOUS | Status: DC
  Administered 2015-08-20: 1 via TRANSDERMAL
  Filled 2015-08-20 (×3): qty 1

## 2015-08-20 MED ORDER — BUPIVACAINE-EPINEPHRINE (PF) 0.5% -1:200000 IJ SOLN
INTRAMUSCULAR | Status: AC
  Filled 2015-08-20: qty 30

## 2015-08-20 MED ORDER — CEFAZOLIN SODIUM-DEXTROSE 2-3 GM-% IV SOLR
2.0000 g | Freq: Four times a day (QID) | INTRAVENOUS | Status: AC
  Administered 2015-08-20 – 2015-08-21 (×3): 2 g via INTRAVENOUS
  Filled 2015-08-20 (×3): qty 50

## 2015-08-20 MED ORDER — ROPIVACAINE HCL 5 MG/ML IJ SOLN
INTRAMUSCULAR | Status: AC
  Filled 2015-08-20: qty 40

## 2015-08-20 MED ORDER — HYDROMORPHONE HCL 1 MG/ML IJ SOLN
0.2500 mg | INTRAMUSCULAR | Status: DC | PRN
  Administered 2015-08-20 (×3): 0.5 mg via INTRAVENOUS
  Administered 2015-08-20 (×2): 0.25 mg via INTRAVENOUS

## 2015-08-20 MED ORDER — NEOMYCIN-POLYMYXIN B GU 40-200000 IR SOLN
Status: AC
  Filled 2015-08-20: qty 16

## 2015-08-20 MED ORDER — TRIAMCINOLONE ACETONIDE 40 MG/ML IJ SUSP
INTRAMUSCULAR | Status: AC
  Filled 2015-08-20: qty 1

## 2015-08-20 MED ORDER — FENTANYL CITRATE (PF) 250 MCG/5ML IJ SOLN
INTRAMUSCULAR | Status: DC | PRN
  Administered 2015-08-20 (×2): 50 ug via INTRAVENOUS
  Administered 2015-08-20: 100 ug via INTRAVENOUS
  Administered 2015-08-20: 50 ug via INTRAVENOUS
  Administered 2015-08-20: 100 ug via INTRAVENOUS

## 2015-08-20 MED ORDER — ACETAMINOPHEN 325 MG PO TABS: 650.0000 mg | ORAL_TABLET | Freq: Four times a day (QID) | ORAL | Status: DC | PRN

## 2015-08-20 MED ORDER — KETAMINE HCL 10 MG/ML IJ SOLN
INTRAMUSCULAR | Status: DC | PRN
  Administered 2015-08-20 (×3): 20 mg via INTRAVENOUS

## 2015-08-20 MED ORDER — LIDOCAINE HCL (PF) 1 % IJ SOLN
INTRAMUSCULAR | Status: AC
  Filled 2015-08-20: qty 2

## 2015-08-20 MED ORDER — SODIUM CHLORIDE 0.9 % IV SOLN
1000.0000 mg | INTRAVENOUS | Status: DC | PRN
  Administered 2015-08-20: 1000 mg via TOPICAL

## 2015-08-20 MED ORDER — MIDAZOLAM HCL 2 MG/2ML IJ SOLN
2.0000 mg | Freq: Once | INTRAMUSCULAR | Status: AC
  Administered 2015-08-20: 2 mg via INTRAVENOUS

## 2015-08-20 MED ORDER — BUPIVACAINE-EPINEPHRINE (PF) 0.5% -1:200000 IJ SOLN
INTRAMUSCULAR | Status: DC | PRN
  Administered 2015-08-20: 30 mL via PERINEURAL

## 2015-08-20 MED ORDER — HYDROMORPHONE HCL 2 MG PO TABS
2.0000 mg | ORAL_TABLET | ORAL | Status: DC | PRN
  Administered 2015-08-21: 2 mg via ORAL
  Filled 2015-08-20: qty 1

## 2015-08-20 MED ORDER — DEXAMETHASONE SODIUM PHOSPHATE 10 MG/ML IJ SOLN
INTRAMUSCULAR | Status: DC | PRN
  Administered 2015-08-20: 10 mg via INTRAVENOUS

## 2015-08-20 MED ORDER — LIDOCAINE HCL (PF) 1 % IJ SOLN
INTRAMUSCULAR | Status: AC
  Filled 2015-08-20: qty 5

## 2015-08-20 MED ORDER — ROCURONIUM BROMIDE 100 MG/10ML IV SOLN
INTRAVENOUS | Status: DC | PRN
  Administered 2015-08-20: 10 mg via INTRAVENOUS
  Administered 2015-08-20: 40 mg via INTRAVENOUS

## 2015-08-20 MED ORDER — BUPIVACAINE LIPOSOME 1.3 % IJ SUSP
INTRAMUSCULAR | Status: DC | PRN
  Administered 2015-08-20: 70 mL

## 2015-08-20 MED ORDER — FAMOTIDINE 20 MG PO TABS
ORAL_TABLET | ORAL | Status: AC
  Filled 2015-08-20: qty 1

## 2015-08-20 MED ORDER — ONDANSETRON HCL 4 MG/2ML IJ SOLN
INTRAMUSCULAR | Status: DC | PRN
  Administered 2015-08-20: 4 mg via INTRAVENOUS

## 2015-08-20 MED ORDER — FENTANYL CITRATE (PF) 100 MCG/2ML IJ SOLN
INTRAMUSCULAR | Status: AC
  Administered 2015-08-20: 25 ug
  Filled 2015-08-20: qty 2

## 2015-08-20 MED ORDER — HYDRALAZINE HCL 20 MG/ML IJ SOLN
INTRAMUSCULAR | Status: AC
  Filled 2015-08-20: qty 1

## 2015-08-20 MED ORDER — ONDANSETRON HCL 4 MG/2ML IJ SOLN
4.0000 mg | Freq: Once | INTRAMUSCULAR | Status: DC | PRN
Start: 2015-08-20 — End: 2015-08-20

## 2015-08-20 MED ORDER — KCL IN DEXTROSE-NACL 20-5-0.9 MEQ/L-%-% IV SOLN
INTRAVENOUS | Status: DC
  Administered 2015-08-20 – 2015-08-21 (×2): via INTRAVENOUS
  Filled 2015-08-20 (×4): qty 1000

## 2015-08-20 MED ORDER — CEFAZOLIN SODIUM-DEXTROSE 2-3 GM-% IV SOLR
2.0000 g | Freq: Once | INTRAVENOUS | Status: AC
  Administered 2015-08-20: 2 g via INTRAVENOUS

## 2015-08-20 MED ORDER — PROPOFOL 10 MG/ML IV BOLUS
INTRAVENOUS | Status: DC | PRN
  Administered 2015-08-20: 200 mg via INTRAVENOUS

## 2015-08-20 MED ORDER — METOCLOPRAMIDE HCL 5 MG PO TABS: 5.0000 mg | ORAL_TABLET | Freq: Three times a day (TID) | ORAL | Status: DC | PRN

## 2015-08-20 MED ORDER — HYDROMORPHONE HCL 1 MG/ML IJ SOLN
1.0000 mg | INTRAMUSCULAR | Status: DC | PRN
  Administered 2015-08-20: 2 mg via INTRAVENOUS
  Administered 2015-08-20 (×2): 1 mg via INTRAVENOUS
  Administered 2015-08-21 (×3): 2 mg via INTRAVENOUS
  Filled 2015-08-20 (×3): qty 2
  Filled 2015-08-20: qty 1
  Filled 2015-08-20: qty 2
  Filled 2015-08-20: qty 1

## 2015-08-20 MED ORDER — MAGNESIUM HYDROXIDE 400 MG/5ML PO SUSP: 30.0000 mL | Freq: Every day | ORAL | Status: DC | PRN

## 2015-08-20 MED ORDER — METOCLOPRAMIDE HCL 5 MG/ML IJ SOLN: 5.0000 mg | Freq: Three times a day (TID) | INTRAMUSCULAR | Status: DC | PRN

## 2015-08-20 MED ORDER — FENTANYL CITRATE (PF) 100 MCG/2ML IJ SOLN: 25.0000 ug | Freq: Once | INTRAMUSCULAR | Status: DC

## 2015-08-20 MED ORDER — MORPHINE SULFATE ER 15 MG PO TBCR
15.0000 mg | EXTENDED_RELEASE_TABLET | Freq: Two times a day (BID) | ORAL | Status: DC
  Administered 2015-08-20 – 2015-08-21 (×2): 15 mg via ORAL
  Filled 2015-08-20 (×2): qty 1

## 2015-08-20 MED ORDER — GABAPENTIN 300 MG PO CAPS
300.0000 mg | ORAL_CAPSULE | Freq: Three times a day (TID) | ORAL | Status: DC
  Administered 2015-08-20 – 2015-08-21 (×3): 300 mg via ORAL
  Filled 2015-08-20 (×3): qty 1

## 2015-08-20 MED ORDER — PHENYLEPHRINE HCL 10 MG/ML IJ SOLN
INTRAMUSCULAR | Status: AC
  Filled 2015-08-20: qty 1

## 2015-08-20 MED ORDER — ONDANSETRON HCL 4 MG/2ML IJ SOLN: 4.0000 mg | Freq: Four times a day (QID) | INTRAMUSCULAR | Status: DC | PRN

## 2015-08-20 MED ORDER — HYDROMORPHONE HCL 1 MG/ML IJ SOLN
INTRAMUSCULAR | Status: AC
  Filled 2015-08-20: qty 1

## 2015-08-20 MED ORDER — BISACODYL 10 MG RE SUPP: 10.0000 mg | Freq: Every day | RECTAL | Status: DC | PRN

## 2015-08-20 MED ORDER — FENTANYL CITRATE (PF) 100 MCG/2ML IJ SOLN: 25.0000 ug | INTRAMUSCULAR | Status: DC | PRN

## 2015-08-20 MED ORDER — SODIUM CHLORIDE 0.9 % IJ SOLN
INTRAMUSCULAR | Status: AC
  Filled 2015-08-20: qty 50

## 2015-08-20 MED ORDER — SUCCINYLCHOLINE CHLORIDE 20 MG/ML IJ SOLN
INTRAMUSCULAR | Status: DC | PRN
  Administered 2015-08-20: 100 mg via INTRAVENOUS

## 2015-08-20 MED ORDER — PANTOPRAZOLE SODIUM 40 MG PO TBEC
40.0000 mg | DELAYED_RELEASE_TABLET | Freq: Two times a day (BID) | ORAL | Status: DC
  Administered 2015-08-20 – 2015-08-21 (×2): 40 mg via ORAL
  Filled 2015-08-20 (×2): qty 1

## 2015-08-20 MED ORDER — HYDRALAZINE HCL 20 MG/ML IJ SOLN
5.0000 mg | Freq: Once | INTRAMUSCULAR | Status: AC
  Administered 2015-08-20: 5 mg via INTRAVENOUS

## 2015-08-20 MED ORDER — FENTANYL CITRATE (PF) 100 MCG/2ML IJ SOLN
50.0000 ug | Freq: Once | INTRAMUSCULAR | Status: AC
  Administered 2015-08-20: 50 ug via INTRAVENOUS
  Filled 2015-08-20: qty 1

## 2015-08-20 MED ORDER — SUGAMMADEX SODIUM 200 MG/2ML IV SOLN
INTRAVENOUS | Status: DC | PRN
  Administered 2015-08-20: 181.4 mg via INTRAVENOUS

## 2015-08-20 MED ORDER — LACTATED RINGERS IV SOLN
INTRAVENOUS | Status: DC
  Administered 2015-08-20 (×2): via INTRAVENOUS

## 2015-08-20 MED ORDER — EPINEPHRINE HCL 1 MG/ML IJ SOLN
INTRAMUSCULAR | Status: AC
  Filled 2015-08-20: qty 1

## 2015-08-20 MED ORDER — KETOROLAC TROMETHAMINE 30 MG/ML IJ SOLN
30.0000 mg | Freq: Once | INTRAMUSCULAR | Status: AC
  Administered 2015-08-20: 30 mg via INTRAVENOUS
  Filled 2015-08-20: qty 1

## 2015-08-20 MED ORDER — TRANEXAMIC ACID 1000 MG/10ML IV SOLN
INTRAVENOUS | Status: AC
  Filled 2015-08-20: qty 10

## 2015-08-20 MED ORDER — CHLORTHALIDONE 25 MG PO TABS
25.0000 mg | ORAL_TABLET | Freq: Every day | ORAL | Status: DC
  Administered 2015-08-21: 25 mg via ORAL
  Filled 2015-08-20: qty 1

## 2015-08-20 MED ORDER — LIDOCAINE HCL (CARDIAC) 20 MG/ML IV SOLN
INTRAVENOUS | Status: DC | PRN
  Administered 2015-08-20: 80 mg via INTRAVENOUS

## 2015-08-20 MED ORDER — ACETAMINOPHEN 10 MG/ML IV SOLN
INTRAVENOUS | Status: DC | PRN
  Administered 2015-08-20: 1000 mg via INTRAVENOUS

## 2015-08-20 MED ORDER — ONDANSETRON HCL 4 MG PO TABS: 4.0000 mg | ORAL_TABLET | Freq: Four times a day (QID) | ORAL | Status: DC | PRN

## 2015-08-20 MED ORDER — KETOROLAC TROMETHAMINE 30 MG/ML IJ SOLN
INTRAMUSCULAR | Status: DC | PRN
  Administered 2015-08-20: 30 mg via INTRAVENOUS

## 2015-08-20 MED ORDER — MIDAZOLAM HCL 5 MG/5ML IJ SOLN
INTRAMUSCULAR | Status: AC
  Filled 2015-08-20: qty 5

## 2015-08-20 MED ORDER — BUPIVACAINE LIPOSOME 1.3 % IJ SUSP
INTRAMUSCULAR | Status: AC
  Filled 2015-08-20: qty 20

## 2015-08-20 MED ORDER — MIDAZOLAM HCL 5 MG/5ML IJ SOLN
1.0000 mg | Freq: Once | INTRAMUSCULAR | Status: AC
  Administered 2015-08-20: 1 mg via INTRAVENOUS

## 2015-08-20 MED ORDER — HYDRALAZINE HCL 20 MG/ML IJ SOLN: 5.0000 mg | Freq: Once | INTRAMUSCULAR | Status: DC

## 2015-08-20 MED ORDER — LISINOPRIL 20 MG PO TABS
30.0000 mg | ORAL_TABLET | Freq: Every day | ORAL | Status: DC
  Administered 2015-08-21: 30 mg via ORAL
  Filled 2015-08-20: qty 1

## 2015-08-20 MED ORDER — DULOXETINE HCL 30 MG PO CPEP
30.0000 mg | ORAL_CAPSULE | Freq: Every day | ORAL | Status: DC
  Administered 2015-08-21: 30 mg via ORAL
  Filled 2015-08-20: qty 1

## 2015-08-20 SURGICAL SUPPLY — 74 items
BAG DECANTER FOR FLEXI CONT (MISCELLANEOUS) ×1 IMPLANT
BIT DRILL 2.5 CANN LNG (BIT) ×1 IMPLANT
BIT DRILL POLYAX 4.5 (BIT) IMPLANT
BLADE SAGITTAL WIDE XTHICK NO (BLADE) ×2 IMPLANT
BOWL CEMENT MIX W/ADAPTER (MISCELLANEOUS) ×1 IMPLANT
CANISTER SUCT 1200ML W/VALVE (MISCELLANEOUS) ×2 IMPLANT
CANISTER SUCT 3000ML PPV (MISCELLANEOUS) ×4 IMPLANT
CAPT SHOULDER PARTIAL 2 ×1 IMPLANT
CATH TRAY METER 16FR LF (MISCELLANEOUS) ×2 IMPLANT
CHLORAPREP W/TINT 26ML (MISCELLANEOUS) ×4 IMPLANT
COOLER POLAR GLACIER W/PUMP (MISCELLANEOUS) ×2 IMPLANT
DRAPE FLUOR MINI C-ARM 54X84 (DRAPES) ×2 IMPLANT
DRAPE IMP U-DRAPE 54X76 (DRAPES) ×4 IMPLANT
DRAPE INCISE IOBAN 66X45 STRL (DRAPES) ×4 IMPLANT
DRAPE INCISE IOBAN 66X60 STRL (DRAPES) ×2 IMPLANT
DRAPE SHEET LG 3/4 BI-LAMINATE (DRAPES) ×4 IMPLANT
DRAPE TABLE BACK 80X90 (DRAPES) ×2 IMPLANT
DRILL BIT 4.5MM (BIT) ×2
DRSG OPSITE POSTOP 4X8 (GAUZE/BANDAGES/DRESSINGS) ×2 IMPLANT
ELECT CAUTERY BLADE 6.4 (BLADE) ×2 IMPLANT
GAUZE PACK 2X3YD (MISCELLANEOUS) ×1 IMPLANT
GLOVE BIO SURGEON STRL SZ8 (GLOVE) ×4 IMPLANT
GLOVE INDICATOR 8.0 STRL GRN (GLOVE) ×2 IMPLANT
GOWN L4 XLG 20 PK N/S (GOWN DISPOSABLE) ×2 IMPLANT
GOWN STRL REUS W/ TWL LRG LVL3 (GOWN DISPOSABLE) ×2 IMPLANT
GOWN STRL REUS W/TWL LRG LVL3 (GOWN DISPOSABLE) ×4
HANDLE YANKAUER SUCT BULB TIP (MISCELLANEOUS) ×1 IMPLANT
HANDPIECE SUCTION TUBG SURGILV (MISCELLANEOUS) ×2 IMPLANT
HOOD PEEL AWAY FLYTE STAYCOOL (MISCELLANEOUS) ×6 IMPLANT
KIT STABILIZATION SHOULDER (MISCELLANEOUS) ×2 IMPLANT
MASK FACE SPIDER DISP (MASK) ×2 IMPLANT
NDL 18GX1X1/2 (RX/OR ONLY) (NEEDLE) ×1 IMPLANT
NDL HYPO 25X1 1.5 SAFETY (NEEDLE) ×1 IMPLANT
NDL MAYO 6 CRC TAPER PT (NEEDLE) ×1 IMPLANT
NDL MAYO CATGUT SZ1 (NEEDLE) ×1 IMPLANT
NDL MAYO CATGUT SZ4 TPR NDL (NEEDLE) ×1 IMPLANT
NDL MAYO CATGUT SZ5 (NEEDLE)
NDL SAFETY 22GX1.5 (NEEDLE) ×2 IMPLANT
NDL SPNL 20GX3.5 QUINCKE YW (NEEDLE) ×1 IMPLANT
NDL SUT 5 .5 CRC TPR PNT MAYO (NEEDLE) ×1 IMPLANT
NEEDLE 18GX1X1/2 (RX/OR ONLY) (NEEDLE) ×2 IMPLANT
NEEDLE HYPO 25X1 1.5 SAFETY (NEEDLE) ×2 IMPLANT
NEEDLE MAYO 6 CRC TAPER PT (NEEDLE) ×2 IMPLANT
NEEDLE MAYO CATGUT SZ1 (NEEDLE) IMPLANT
NEEDLE MAYO CATGUT SZ4 (NEEDLE) IMPLANT
NEEDLE SPNL 20GX3.5 QUINCKE YW (NEEDLE) ×2 IMPLANT
NS IRRIG 1000ML POUR BTL (IV SOLUTION) ×2 IMPLANT
PACK ARTHROSCOPY SHOULDER (MISCELLANEOUS) ×2 IMPLANT
PAD WRAPON POLAR SHDR UNIV (MISCELLANEOUS) ×1 IMPLANT
PIN HUMERAL STMN 3.2MMX9IN (INSTRUMENTS) IMPLANT
SLING ULTRA II M (MISCELLANEOUS) ×2 IMPLANT
SOL .9 NS 3000ML IRR  AL (IV SOLUTION) ×1
SOL .9 NS 3000ML IRR AL (IV SOLUTION) ×1
SOL .9 NS 3000ML IRR UROMATIC (IV SOLUTION) ×1 IMPLANT
SPONGE LAP 18X18 5 PK (GAUZE/BANDAGES/DRESSINGS) ×1 IMPLANT
STAPLER SKIN PROX 35W (STAPLE) ×2 IMPLANT
STRAP SAFETY BODY (MISCELLANEOUS) ×3 IMPLANT
SUT ETHIBOND #5 BRAIDED 30INL (SUTURE) ×6 IMPLANT
SUT ETHIBOND 0 MO6 C/R (SUTURE) ×2 IMPLANT
SUT ETHIBOND NAB CT1 #1 30IN (SUTURE) ×2 IMPLANT
SUT FIBERWIRE #2 38 BLUE 1/2 (SUTURE)
SUT STEEL 5 (SUTURE) IMPLANT
SUT TICRON 2-0 30IN 311381 (SUTURE) ×11 IMPLANT
SUT VIC AB 0 CT1 36 (SUTURE) ×4 IMPLANT
SUT VIC AB 2-0 CT1 (SUTURE) ×3 IMPLANT
SUT VIC AB 2-0 CT1 27 (SUTURE) ×4
SUT VIC AB 2-0 CT1 TAPERPNT 27 (SUTURE) ×2 IMPLANT
SUTURE FIBERWR #2 38 BLUE 1/2 (SUTURE) ×1 IMPLANT
SYR 30ML LL (SYRINGE) ×4 IMPLANT
SYR TB 1ML LUER SLIP (SYRINGE) ×2 IMPLANT
SYRINGE 10CC LL (SYRINGE) ×2 IMPLANT
TUBE CONNECTING 6X3/16 (MISCELLANEOUS) ×2 IMPLANT
WATER STERILE IRR 1000ML POUR (IV SOLUTION) ×1 IMPLANT
WRAPON POLAR PAD SHDR UNIV (MISCELLANEOUS) ×2

## 2015-08-20 NOTE — Anesthesia Preprocedure Evaluation (Signed)
Anesthesia Evaluation  Patient identified by MRN, date of birth, ID band Patient awake    Reviewed: Allergy & Precautions, NPO status , Patient's Chart, lab work & pertinent test results  History of Anesthesia Complications Negative for: history of anesthetic complications  Airway Mallampati: II       Dental  (+) Teeth Intact   Pulmonary neg pulmonary ROS,           Cardiovascular hypertension, Pt. on medications      Neuro/Psych Anxiety negative neurological ROS     GI/Hepatic negative GI ROS, Neg liver ROS,   Endo/Other  negative endocrine ROS  Renal/GU      Musculoskeletal   Abdominal   Peds  Hematology negative hematology ROS (+)   Anesthesia Other Findings   Reproductive/Obstetrics                             Anesthesia Physical Anesthesia Plan  ASA: II  Anesthesia Plan: General and Regional   Post-op Pain Management: MAC Combined w/ Regional for Post-op pain   Induction: Intravenous  Airway Management Planned: Oral ETT  Additional Equipment:   Intra-op Plan:   Post-operative Plan:   Informed Consent: I have reviewed the patients History and Physical, chart, labs and discussed the procedure including the risks, benefits and alternatives for the proposed anesthesia with the patient or authorized representative who has indicated his/her understanding and acceptance.     Plan Discussed with:   Anesthesia Plan Comments:         Anesthesia Quick Evaluation

## 2015-08-20 NOTE — Anesthesia Postprocedure Evaluation (Signed)
Anesthesia Post Note  Patient: Stephen Ray  Procedure(s) Performed: Procedure(s) (LRB): SHOULDER HEMI-ARTHROPLASTY (Left)  Patient location during evaluation: PACU Anesthesia Type: General Level of consciousness: awake and alert Pain management: pain level controlled Vital Signs Assessment: post-procedure vital signs reviewed and stable Respiratory status: spontaneous breathing and respiratory function stable Cardiovascular status: stable Anesthetic complications: no    Last Vitals:  Filed Vitals:   08/20/15 1435 08/20/15 1611  BP: 152/101 145/81  Pulse:  81  Temp:  36.5 C  Resp:  16    Last Pain:  Filed Vitals:   08/20/15 1611  PainSc: 5                  KEPHART,WILLIAM K

## 2015-08-20 NOTE — Transfer of Care (Signed)
Immediate Anesthesia Transfer of Care Note  Patient: Stephen Ray  Procedure(s) Performed: Procedure(s): SHOULDER HEMI-ARTHROPLASTY (Left)  Patient Location: PACU  Anesthesia Type:General  Level of Consciousness: sedated  Airway & Oxygen Therapy: Patient Spontanous Breathing and Patient connected to face mask oxygen  Post-op Assessment: Report given to RN and Post -op Vital signs reviewed and stable  Post vital signs: Reviewed and stable  Last Vitals: 13:15 108hr 100% 16resp 132/91   Filed Vitals:   08/20/15 0934 08/20/15 1015  BP: 167/101 161/103  Pulse: 58 62  Temp: 36.7 C   Resp: 16 16    Complications: No apparent anesthesia complications

## 2015-08-20 NOTE — Op Note (Signed)
08/20/2015  1:28 PM  Patient:   Stephen Ray  Pre-Op Diagnosis:   Degenerative joint disease, left shoulder.  Post-Op Diagnosis:   Same.  Procedure:   Left shoulder hemiarthroplasty.  Surgeon:   Pascal Lux, MD  Assistant:   Cameron Proud, PA-C  Anesthesia:   General endotracheal intubation with an interscalene block placed preoperatively by the anesthesiologist.  Findings:   As above. The rotator cuff was in satisfactory condition.  Complications:   None  EBL:  200 cc  Fluids:   1100 cc crystalloid  UOP:   None  TT:   None  Drains:   None  Closure:   Staples  Implants:   Biomet Comprehensive system with a pressfit #16 mini-humeral stem and a 46 x 21 mm humeral head.  Brief Clinical Note:   The patient is a 49 year old male with a long history of bilateral shoulder pain secondary to advanced degenerative joint disease. The patient underwent a right shoulder hemiarthroplasty nearly 8 months ago, from which he is doing reasonably well. The patientprogressively worsening left shoulder symptoms and presents at this time for a left shoulder hemiarthroplasty.  Procedure:   The patient underwent placement of an interscalene block the preoperative holding area before he was brought into the operating room and lain in the supine position on the OR table. The patient then underwent general endotracheal intubation and anesthesia before being repositioned in the beach chair position using the beach chair positioner. The left shoulder and upper extremity were prepped with ChloraPrep solution before being draped sterilely. Preoperative antibiotics were administered. A standard anterior approach to the shoulder was made through an approximately 4-5 inch incision. The incision was carried down through the subcutaneous tissues to expose the deltopectoral fascia. The interval between the deltoid and pectoralis muscles was identified and this plane developed, retracting the cephalic vein  laterally with the deltoid muscle. During the dissection, the cephalic vein was lacerated, so it was sutured ligated proximally and distally. The conjoined tendon was identified. The lateral margin was dissected and the Kolbel self-retraining retractor inserted. The "three sisters" were identified and cauterized. Bursal tissues were removed to improve visualization. The subscapularis tendon was released from its attachment to the lesser tuberosity 1 cm proximal to its insertion and several tagging sutures placed. The inferior capsule was released with care after identifying and protecting the axillary nerve. The proximal humeral cut was made at approximately 30 of retroversion using the extra-medullary guide.   Attention was directed to the glenoid. The labrum was debrided circumferentially before the center of the glenoid was marked with electrocautery. Given the patient's age and activity level, it was felt best not to proceed with a glenoid component. Therefore, a "ream and run technique have been discussed with the patient preoperatively and he was agreeable, especially as he had this done on the opposite side and has been reasonably pleased with his results to this point. The glenoid was sized and found to be optimally covered by a medium-sized glenoid component. The guidewire was drilled into the glenoid neck using the appropriate guide. After verifying its position, the glenoid was reamed flat with the butterfly reamer. Numerous additional small holes were drilled into the eburnated bone of the glenoid in order to stimulate fibrocartilage formation.  Attention was directed to the humeral side. The humeral canal was reamed sequentially beginning with the end-cutting reamer, then progressing from a 4 mm reamer up to a 16 mm reamer. This provided excellent circumferential chatter. The canal was  broached beginning with a #14 broach and progressing to a #16 broach. This was left in place and a trial reduction  performed using the 46 x 21 mm trial humeral head. The arm demonstrated excellent range of motion as the hand could be brought across the chest to the opposite shoulder and brought to the top of the patient's head and to the patient's ear. The shoulder remained stable throughout this range of motion, and was stable with abduction and external rotation. The joint was dislocated and the trial components removed. The permanent #16 micro-stem was impacted into place with care taken to maintain the appropriate version. The permanent 46 x 21 mm humeral head with the standard taper set in the "C" position was put together on the back table and impacted into place. Again, the [facility] taper locking mechanism was verified using manual distraction. The shoulder was relocated and again placed through a range of motion with the findings as described above.  The wound was copiously irrigated with bacitracin saline solution using the jet lavage system before a total of 20 cc of Exparel diluted out to 60 cc with normal saline and 30 cc of 0.5% Sensorcaine with epinephrine was injected into the pericapsular and peri-incisional tissues to help with postoperative analgesia. The subscapularis tendon was reapproximated using #2 FiberWire interrupted sutures. The deltopectoral interval was closed using 2-0 Vicryl interrupted sutures before the subcutaneous tissues also were closed using 2-0 Vicryl interrupted sutures. The skin was closed using staples. Prior to closing the skin, 1 g of transexemic acid in 10 cc of normal saline was injected intra-articularly to help with postoperative bleeding. A sterile occlusive dressing was applied to the wound before the arm was placed into a shoulder immobilizer with an abduction pillow. A polar pack system also was applied to the shoulder. The patient was then transferred back to a hospital bed before being awakened, extubated, and returned to the recovery room in satisfactory condition after  tolerating the procedure well.

## 2015-08-20 NOTE — Anesthesia Procedure Notes (Signed)
Procedure Name: Intubation Date/Time: 08/20/2015 11:00 AM Performed by: Delaney Meigs Pre-anesthesia Checklist: Patient identified, Emergency Drugs available, Suction available, Patient being monitored and Timeout performed Patient Re-evaluated:Patient Re-evaluated prior to inductionOxygen Delivery Method: Circle system utilized Preoxygenation: Pre-oxygenation with 100% oxygen Intubation Type: IV induction Ventilation: Mask ventilation without difficulty Laryngoscope Size: Mac and 3 Grade View: Grade III Tube type: Oral Tube size: 7.5 mm Number of attempts: 1 Airway Equipment and Method: Stylet Placement Confirmation: ETT inserted through vocal cords under direct vision,  positive ETCO2 and breath sounds checked- equal and bilateral Secured at: 21 cm Tube secured with: Tape Dental Injury: Teeth and Oropharynx as per pre-operative assessment

## 2015-08-20 NOTE — H&P (Signed)
Paper H&P to be scanned into permanent record. H&P reviewed. No changes. 

## 2015-08-21 ENCOUNTER — Encounter: Payer: Self-pay | Admitting: Surgery

## 2015-08-21 LAB — CBC WITH DIFFERENTIAL/PLATELET
Basophils Absolute: 0 10*3/uL (ref 0–0.1)
Basophils Relative: 0 %
Eosinophils Absolute: 0 10*3/uL (ref 0–0.7)
Eosinophils Relative: 0 %
HEMATOCRIT: 36.9 % — AB (ref 40.0–52.0)
HEMOGLOBIN: 12.5 g/dL — AB (ref 13.0–18.0)
LYMPHS ABS: 1.3 10*3/uL (ref 1.0–3.6)
LYMPHS PCT: 8 %
MCH: 32.5 pg (ref 26.0–34.0)
MCHC: 34 g/dL (ref 32.0–36.0)
MCV: 95.7 fL (ref 80.0–100.0)
Monocytes Absolute: 0.7 10*3/uL (ref 0.2–1.0)
Monocytes Relative: 5 %
NEUTROS PCT: 87 %
Neutro Abs: 13.7 10*3/uL — ABNORMAL HIGH (ref 1.4–6.5)
Platelets: 230 10*3/uL (ref 150–440)
RBC: 3.85 MIL/uL — AB (ref 4.40–5.90)
RDW: 13.7 % (ref 11.5–14.5)
WBC: 15.8 10*3/uL — AB (ref 3.8–10.6)

## 2015-08-21 LAB — BASIC METABOLIC PANEL
Anion gap: 6 (ref 5–15)
BUN: 16 mg/dL (ref 6–20)
CHLORIDE: 105 mmol/L (ref 101–111)
CO2: 28 mmol/L (ref 22–32)
Calcium: 8.6 mg/dL — ABNORMAL LOW (ref 8.9–10.3)
Creatinine, Ser: 1.08 mg/dL (ref 0.61–1.24)
GFR calc non Af Amer: >60 mL/min (ref >60)
Glucose, Bld: 152 mg/dL — ABNORMAL HIGH (ref 65–99)
POTASSIUM: 4 mmol/L (ref 3.5–5.1)
SODIUM: 139 mmol/L (ref 135–145)

## 2015-08-21 MED ORDER — MORPHINE SULFATE ER 15 MG PO TBCR: 15.0000 mg | EXTENDED_RELEASE_TABLET | Freq: Two times a day (BID) | ORAL | Status: DC

## 2015-08-21 MED ORDER — HYDROMORPHONE HCL 2 MG PO TABS: 2.0000 mg | ORAL_TABLET | ORAL | Status: DC | PRN

## 2015-08-21 NOTE — Progress Notes (Signed)
  Subjective: 1 Day Post-Op Procedure(s) (LRB): SHOULDER HEMI-ARTHROPLASTY (Left) Patient reports pain as 8 on 0-10 scale.   Patient is well, but has had some minor complaints of pain Plan is to go Home after hospital stay. Negative for chest pain and shortness of breath Fever: no Gastrointestinal:Negative for nausea and vomiting  Objective: Vital signs in last 24 hours: Temp:  [97.7 F (36.5 C)-99.3 F (37.4 C)] 97.8 F (36.6 C) (12/02 0726) Pulse Rate:  [58-89] 89 (12/02 0726) Resp:  [16-18] 18 (12/02 0726) BP: (130-167)/(68-103) 139/83 mmHg (12/02 0726) SpO2:  [96 %-99 %] 99 % (12/02 0726) Weight:  [95.255 kg (210 lb)] 95.255 kg (210 lb) (12/01 1609)  Intake/Output from previous day:  Intake/Output Summary (Last 24 hours) at 08/21/15 0749 Last data filed at 08/21/15 [id number]  Gross per 24 hour  Intake 2768.33 ml  Output    200 ml  Net 2568.33 ml    Intake/Output this shift:    Labs:  Recent Labs  08/20/15 1003 08/21/15 0512  HGB 15.6 12.5*    Recent Labs  08/20/15 1003 08/21/15 0512  WBC  --  15.8*  RBC  --  3.85*  HCT 46.0 36.9*  PLT  --  230    Recent Labs  08/20/15 1003 08/21/15 0512  NA 139 139  K 3.1* 4.0  CL  --  105  CO2  --  28  BUN  --  16  CREATININE  --  1.08  GLUCOSE 125* 152*  CALCIUM  --  8.6*   No results for input(s): LABPT, INR in the last 72 hours.   EXAM General - Patient is Alert, Appropriate and Oriented Extremity - Neurologically intact ABD soft Sensation intact distally Intact pulses distally Incision: scant drainage No cellulitis present Dressing/Incision - blood tinged drainage, with mild ecchymosis at the distal aspect of the incision. Motor Function - intact, moving foot and toes well on exam.   Pt is able to actively flex and extend at the elbow with mild discomfort, able to make a full composite fist.  Past Medical History  Diagnosis Date  . Shoulder pain     left and right  . Hypertension   . Anxiety    . Arthritis     Assessment/Plan: 1 Day Post-Op Procedure(s) (LRB): SHOULDER HEMI-ARTHROPLASTY (Left) Active Problems:   Status post left shoulder hemiarthroplasty  Estimated body mass index is 30.13 kg/(m^2) as calculated from the following:   Height as of this encounter: 5\' 10"  (1.778 m).   Weight as of this encounter: 95.255 kg (210 lb). Advance diet Up with therapy D/C IV fluids when tolerating PO intake.  Pt still in a extreme amount of pain, will continue to monitor. Plan on potential discharge home tomorrow. Pt is urinating without a Foley. Labs reviewed, CBC and BMP ordered for tomorrow.  DVT Prophylaxis - Lovenox, Foot Pumps and TED hose Non weight bearing to the left upper extremity.  Raquel James, PA-C [facility] Orthopaedic Surgery 08/21/2015, 7:49 AM

## 2015-08-21 NOTE — Progress Notes (Signed)
Discharge instructions reviewed with the pt and his wife.  IV removed.  Pt insisted on walking out. I escorted him to the hospital entrance

## 2015-08-21 NOTE — Discharge Summary (Signed)
Physician Discharge Summary  Patient ID: Stephen Ray MRN: [id number] DOB/AGE: 02-02-1966 49 y.o.  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  Discharge Diagnoses: Patient Active Problem List   Diagnosis Date Noted  . Status post left shoulder hemiarthroplasty 08/20/2015  . Anxiety 03/30/2015  . AC joint pain 03/30/2015  . Essential (primary) hypertension 03/30/2015  . Drug abuse, opioid type 03/30/2015  . Arthritis of shoulder region, degenerative 03/30/2015  . Calculus of kidney 03/30/2015  . Arthritis or polyarthritis, rheumatoid ([place]) 03/30/2015  . Acute anxiety 03/30/2015  . Neuropathic pain 08/29/2013  . Biceps tendinitis 01/28/2013  . Bursitis of shoulder 01/28/2013  . Calculi, ureter 01/17/2013  . Chronic pain associated with significant psychosocial dysfunction 01/17/2013  . Polypharmacy 01/17/2013  . Speech disorder 01/17/2013  . Renal colic [id number]  Degenerative joint disease, left shoulder.  Past Medical History  Diagnosis Date  . Shoulder pain     left and right  . Hypertension   . Anxiety   . Arthritis      Transfusion: None   Consultants (if any):    Discharged Condition: Improved  Hospital Course: Stephen Ray is an 49 y.o. male who was admitted 08/20/2015 with a diagnosis of degenerative joint disease, left shoulder and went to the operating room on 08/20/2015 and underwent the above named procedures.    Surgeries: Procedure(s): SHOULDER HEMI-ARTHROPLASTY on 08/20/2015 Patient tolerated the surgery well. Taken to PACU where she was stabilized and then transferred to the orthopedic floor.  Started on Lovenox 30mg  q 12 hrs. Foot pumps applied bilaterally at 80 mm. Heels elevated on bed with rolled towels. No evidence of DVT. Negative Homan. Physical therapy started on day #1 for gait training and transfer. OT started day #1  for ADL and assisted devices.  Patient's IV was d/c on POD1  Implants: Biomet Comprehensive system with a pressfit #16 mini-humeral stem and a 46 x 21 mm humeral head.  He was given perioperative antibiotics:  Anti-infectives    Start     Dose/Rate Route Frequency Ordered Stop   08/20/15 1700  ceFAZolin (ANCEF) IVPB 2 g/50 mL premix     2 g 100 mL/hr over 30 Minutes Intravenous Every 6 hours 08/20/15 1554 08/21/15 0543   08/20/15 0945  ceFAZolin (ANCEF) IVPB 2 g/50 mL premix     2 g 100 mL/hr over 30 Minutes Intravenous  Once 08/20/15 0941 08/20/15 1119   08/20/15 0940  ceFAZolin (ANCEF) 2-3 GM-% IVPB SOLR    Comments:  SCHNIER, JULIE: cabinet override      08/20/15 0940 08/20/15 2144    .  He was given sequential compression devices, early ambulation, and Lovenox for DVT prophylaxis.  He benefited maximally from the hospital stay and there were no complications.    Recent vital signs:  Filed Vitals:   08/21/15 0403 08/21/15 0726  BP: 130/83 139/83  Pulse: 80 89  Temp: 98.5 F (36.9 C) 97.8 F (36.6 C)  Resp: 18 18    Recent laboratory studies:  Lab Results  Component Value Date   HGB 12.5* 08/21/2015   HGB 15.6 08/20/2015   HGB 14.6 08/05/2015   Lab Results  Component Value Date   WBC 15.8* 08/21/2015   PLT 230 08/21/2015   Lab Results  Component Value Date   INR 0.89 08/05/2015   Lab Results  Component Value Date   NA 139 08/21/2015   K 4.0 08/21/2015  CL 105 08/21/2015   CO2 28 08/21/2015   BUN 16 08/21/2015   CREATININE 1.08 08/21/2015   GLUCOSE 152* 08/21/2015    Discharge Medications:     Medication List    TAKE these medications        chlorthalidone 25 MG tablet  Commonly known as:  HYGROTON  Take 25 mg by mouth daily.     DULoxetine 30 MG capsule  Commonly known as:  CYMBALTA  Take 1 capsule (30 mg total) by mouth daily.     gabapentin 300 MG capsule  Commonly known as:  NEURONTIN  Take one capsule by mouth 3 times a day. May  increase to 2 tablets at night as needed.     HYDROmorphone 2 MG tablet  Commonly known as:  DILAUDID  Take 1-2 tablets (2-4 mg total) by mouth every 3 (three) hours as needed for moderate pain.     lidocaine 5 %  Commonly known as:  [place] onto the skin.     lisinopril 30 MG tablet  Commonly known as:  PRINIVIL,ZESTRIL  Take 1 tablet (30 mg total) by mouth daily.     morphine 15 MG 12 hr tablet  Commonly known as:  MS CONTIN  Take 1 tablet (15 mg total) by mouth every 12 (twelve) hours.        Diagnostic Studies: Dg Shoulder Left Port  08/20/2015  CLINICAL DATA:  Patient status post left shoulder hemiarthroplasty. EXAM: LEFT SHOULDER - 1 VIEW COMPARISON:  Shoulder MRI 03/16/2015. FINDINGS: Three images were submitted demonstrating left shoulder arthroplasty. Overlying surgical staple line. Hardware is intact. No evidence for acute osseous abnormality. IMPRESSION: Postprocedural changes compatible with left shoulder arthroplasty. Electronically Signed   By: Lovey Newcomer M.D.   On: 08/20/2015 16:02    Disposition: Pt is stable and ready for discharge.  Pt is urinating post-surgery without a foley.  Pt will be discharged with Oxycontin and Diluadid as needed for pain control.  Pt will take a daily 81mg  ASA for DVT prophylaxis.       Follow-up Information    Follow up with Judson Roch, PA-C In 10 days.   Specialty:  Physician Assistant   Why:  For staple removal, For wound re-check   Contact information:   [place] [phone]        Signed: Judson Roch PA-C 08/21/2015, 8:44 AM

## 2015-08-21 NOTE — Care Management (Signed)
Patient ambulating independently when I rounded. No RNCM needs. Case closed.

## 2015-08-21 NOTE — Discharge Instructions (Signed)
Shoulder Joint Replacement, Care After °Refer to this sheet in the next few weeks. These instructions provide you with information on caring for yourself after your procedure. Your health care provider may also give you more specific instructions. Your treatment has been planned according to current medical practices, but problems sometimes occur. Call your health care provider if you have any problems or questions after your procedure. °WHAT TO EXPECT AFTER THE PROCEDURE °After your procedure, your arm and shoulder will typically be stiff and bruised. This will improve over time. °HOME CARE INSTRUCTIONS  °· You may resume your normal diet and activities as directed by your surgeon. °· You should regain full use of your shoulder in 6 weeks. °· Your arm will be in a sling. You will need to wear this for 4-6 weeks after surgery. °· Wear the sling every night for at least the first month, or as instructed by your surgeon. °· Do not use your arm to push yourself up in bed or from a chair. This requires too much muscle. °· Follow the program of home exercises suggested. Do the exercises 4-5 times a day for a month or as directed. °· Try not to overuse your shoulder. Overusing the shoulder is easy to do if this is the first time you have been pain free in a long time. Early overuse of the shoulder may result in later problems. °· Do not lift anything heavier than a cup of coffee for the first 6 weeks after surgery. °· Ask for help. Your health care provider may be able to suggest a clinic or agency for this if you do not have home support. °· Do not participate in contact sports or do any heavy lifting (more than 10 lb [4.5 kg]) for at least 6 months, or as directed. °· Apply ice to the injured area for the first 2 days after surgery: °¨ Put ice in a plastic bag. °¨ Place a towel between your skin and the bag. °¨ Leave the ice on for 20 minutes, 2-3 times a day. °· Change dressings if necessary or as directed. °· Only  take over-the-counter or prescription medicines for pain, discomfort, or fever as directed by your health care provider. °· Keep all follow-up appointments as directed. °SEEK MEDICAL CARE IF: °· You have redness, swelling, or increasing pain in the wound. °· You see pus coming from the wound. °· You have a fever. °· You notice a bad smell coming from the wound or dressing. °· The edges of the wound break open after sutures or staples have been removed. °· You have increasing pain with movement of the shoulder. °SEEK IMMEDIATE MEDICAL CARE IF:  °· You develop a rash. °· You have chest pain or shortness of breath. °· You have any reaction or side effects to medicine given. °MAKE SURE YOU: °· Understand these instructions. °· Will watch your condition. °· Will get help right away if you are not doing well or get worse. °  °This information is not intended to replace advice given to you by your health care provider. Make sure you discuss any questions you have with your health care provider. °  °Document Released: 03/25/2005 Document Revised: 09/10/2013 Document Reviewed: 04/04/2013 °Elsevier Interactive Patient Education ©2016 [employer] ° °

## 2015-08-21 NOTE — Evaluation (Signed)
Physical Therapy Evaluation Patient Details Name: GODFREY TRITSCHLER MRN: [id number] DOB: 09/20/1965 Today's Date: 08/21/2015   History of Present Illness  Pt had a R total shoulder in April, now here with L total shoulder  Clinical Impression  Pt is completely independent with all mobility, ambulation, steps etc.  He shows good confidence with all acts and though he does have expected pain it does not limit him.  Pt walking with exceptional speed and confidence with no balance or safety concerns.  Pt had R shoulder replaced earlier this year and is comfortable with precautions, sling and plan to do outpatient in a week or 2 per ortho.    Follow Up Recommendations Outpatient PT (when surgeon clears him for outpatient)    Equipment Recommendations       Recommendations for Other Services       Precautions / Restrictions Precautions Precautions: Shoulder Required Braces or Orthoses: Sling Restrictions Weight Bearing Restrictions: Yes LUE Weight Bearing: Non weight bearing      Mobility  Bed Mobility Overal bed mobility: Independent                Transfers Overall transfer level: Independent               General transfer comment: No issues with mobiltiy  Ambulation/Gait Ambulation/Gait assistance: Independent Ambulation Distance (Feet): 250 Feet Assistive device: None       General Gait Details: Pt with great speed and confidence, no safety issues  Stairs Stairs: Yes Stairs assistance: Independent Stair Management: No rails Number of Stairs: 4 General stair comments: Again good confidence and safety  Wheelchair Mobility    Modified Rankin (Stroke Patients Only)       Balance Overall balance assessment: Independent                                           Pertinent Vitals/Pain Pain Assessment: 0-10 Pain Score: 7     Home Living Family/patient expects to be discharged to:: Private residence Living Arrangements:  Spouse/significant other     Home Access: Stairs to enter   Technical brewer of Steps: 2          Prior Function Level of Independence: Independent         Comments: Pt reports that R total shoulder recovery went well and he is looking forward to L being mre functinoal     Hand Dominance        Extremity/Trunk Assessment   Upper Extremity Assessment:  (L not tested, has R shoulder elevation AROM ~110)           Lower Extremity Assessment: Overall WFL for tasks assessed         Communication   Communication: No difficulties  Cognition Arousal/Alertness: Awake/alert Behavior During Therapy: WFL for tasks assessed/performed Overall Cognitive Status: Within Functional Limits for tasks assessed                      General Comments      Exercises        Assessment/Plan    PT Assessment All further PT needs can be met in the next venue of care  PT Diagnosis Generalized weakness   PT Problem List    PT Treatment Interventions     PT Goals (Current goals can be found in the Care Plan section) Acute Rehab PT Goals  Patient Stated Goal: Go home PT Goal Formulation: With patient Time For Goal Achievement: 08/28/15 Potential to Achieve Goals: Good    Frequency     Barriers to discharge        Co-evaluation               End of Session   Activity Tolerance: Patient tolerated treatment well Patient left: in chair Nurse Communication: Mobility status         Time: 9147-8295 PT Time Calculation (min) (ACUTE ONLY): 15 min   Charges:   PT Evaluation $Initial PT Evaluation Tier I: 1 Procedure     PT G Codes:       Wayne Both, PT, DPT [phone]   Kreg Shropshire 08/21/2015, 10:10 AM

## 2015-08-24 LAB — SURGICAL PATHOLOGY

## 2015-10-16 ENCOUNTER — Telehealth: Payer: Self-pay | Admitting: Family Medicine

## 2015-10-16 ENCOUNTER — Other Ambulatory Visit: Payer: Self-pay

## 2015-10-16 MED ORDER — GABAPENTIN 300 MG PO CAPS: 300.0000 mg | ORAL_CAPSULE | Freq: Three times a day (TID) | ORAL | Status: DC

## 2015-10-16 NOTE — Telephone Encounter (Signed)
Pt needs a refill on gabapentin.  His call back number is [phone]

## 2015-10-16 NOTE — Telephone Encounter (Signed)
Refilled

## 2015-11-02 ENCOUNTER — Other Ambulatory Visit: Payer: Self-pay | Admitting: Family Medicine

## 2015-12-21 ENCOUNTER — Other Ambulatory Visit: Payer: Self-pay | Admitting: Family Medicine

## 2015-12-30 ENCOUNTER — Other Ambulatory Visit: Payer: Self-pay | Admitting: Family Medicine

## 2016-01-04 ENCOUNTER — Telehealth: Payer: Self-pay | Admitting: Family Medicine

## 2016-01-04 DIAGNOSIS — I10 Essential (primary) hypertension: Secondary | ICD-10-CM

## 2016-01-04 MED ORDER — CHLORTHALIDONE 25 MG PO TABS: 25.0000 mg | ORAL_TABLET | Freq: Every day | ORAL | Status: DC

## 2016-01-04 MED ORDER — LISINOPRIL 30 MG PO TABS: 30.0000 mg | ORAL_TABLET | Freq: Every day | ORAL | Status: DC

## 2016-01-04 MED ORDER — DULOXETINE HCL 30 MG PO CPEP: 30.0000 mg | ORAL_CAPSULE | Freq: Every day | ORAL | Status: DC

## 2016-01-04 NOTE — Telephone Encounter (Signed)
Pt needs a refill on duloxetine sent to [facility] [facility].  He asked if you could check and see if he needs refills on lisinopril and chlorthalidone.  His call back number is [phone]

## 2016-01-04 NOTE — Telephone Encounter (Signed)
OK to refill 30 days of Duloxitine, Lisinopril and Chlorthalodone, but get back with patient and let him know he must be seen in office over the nexst 30 days, or he can have no more refills. He has missed his f/u appt.

## 2016-01-04 NOTE — Telephone Encounter (Signed)
Sent in with no refills left message

## 2016-01-04 NOTE — Telephone Encounter (Signed)
Last appt 05/2015 if I am looking at this right. [facility]

## 2016-02-04 ENCOUNTER — Encounter: Payer: Self-pay | Admitting: Family Medicine

## 2016-02-04 ENCOUNTER — Ambulatory Visit (INDEPENDENT_AMBULATORY_CARE_PROVIDER_SITE_OTHER): Payer: BC Managed Care – PPO | Admitting: Family Medicine

## 2016-02-04 VITALS — BP 125/80 | HR 58 | Temp 98.1°F | Resp 16 | Ht 70.0 in | Wt 205.0 lb

## 2016-02-04 DIAGNOSIS — M792 Neuralgia and neuritis, unspecified: Secondary | ICD-10-CM

## 2016-02-04 DIAGNOSIS — F419 Anxiety disorder, unspecified: Secondary | ICD-10-CM

## 2016-02-04 DIAGNOSIS — I10 Essential (primary) hypertension: Secondary | ICD-10-CM

## 2016-02-04 DIAGNOSIS — Z1211 Encounter for screening for malignant neoplasm of colon: Secondary | ICD-10-CM | POA: Diagnosis not present

## 2016-02-04 MED ORDER — DULOXETINE HCL 30 MG PO CPEP: 30.0000 mg | ORAL_CAPSULE | Freq: Every day | ORAL | Status: DC

## 2016-02-04 MED ORDER — CHLORTHALIDONE 25 MG PO TABS: 25.0000 mg | ORAL_TABLET | Freq: Every day | ORAL | Status: DC

## 2016-02-04 MED ORDER — LISINOPRIL 30 MG PO TABS: 30.0000 mg | ORAL_TABLET | Freq: Every day | ORAL | Status: DC

## 2016-02-04 MED ORDER — GABAPENTIN 300 MG PO CAPS: ORAL_CAPSULE | ORAL | Status: DC

## 2016-02-04 NOTE — Progress Notes (Signed)
Name: Stephen Ray   MRN: [id number]    DOB: 20-Nov-1965   Date:02/04/2016       Progress Note  Subjective  Chief Complaint  Chief Complaint  Patient presents with  . Hypertension    HPI Here for f/u of HBP and anxiety and chronic pain.  He demands that he not be considered to be depressed.  Had surgery.  No other pain meds.  Depression seems to be doing well.  Pain control is doing well also.  Still with occ. Insomnia.  States that BP at home is usually in 120 sys range.  He is no longer taking opoids for pain.  No problem-specific assessment & plan notes found for this encounter.   Past Medical History  Diagnosis Date  . Shoulder pain     left and right  . Hypertension   . Anxiety   . Arthritis     Past Surgical History  Procedure Laterality Date  . Shoulder surgery Right 12/25/2014  . Cervical fusion    . Lithotripsy    . Hernia repair    . Total shoulder arthroplasty Right   . Shoulder hemi-arthroplasty Left 08/20/2015    Procedure: SHOULDER HEMI-ARTHROPLASTY;  Surgeon: Corky Mull, MD;  Location: [facility] ORS;  Service: Orthopedics;  Laterality: Left;    Family History  Problem Relation Age of Onset  . Family history unknown: Yes    Social History   Social History  . Marital Status: Married    Spouse Name: N/A  . Number of Children: N/A  . Years of Education: N/A   Occupational History  . Not on file.   Social History Main Topics  . Smoking status: Never Smoker   . Smokeless tobacco: Never Used  . Alcohol Use: 0.0 oz/week    0 Standard drinks or equivalent per week  . Drug Use: No  . Sexual Activity: Not on file   Other Topics Concern  . Not on file   Social History Narrative     Current outpatient prescriptions:  .  chlorthalidone (HYGROTON) 25 MG tablet, Take 1 tablet (25 mg total) by mouth daily., Disp: 30 tablet, Rfl: 12 .  DULoxetine (CYMBALTA) 30 MG capsule, Take 1 capsule (30 mg total) by mouth daily., Disp: 30 capsule, Rfl: 6 .   gabapentin (NEURONTIN) 300 MG capsule, Take 1 tablet twice a day and 2 at bedtime., Disp: 120 capsule, Rfl: 12 .  lidocaine (LIDODERM) 5 %, Place onto the skin., Disp: , Rfl:  .  lisinopril (PRINIVIL,ZESTRIL) 30 MG tablet, Take 1 tablet (30 mg total) by mouth daily., Disp: 30 tablet, Rfl: 12  Allergies  Allergen Reactions  . Shellfish Allergy Anaphylaxis  . Shellfish-Derived Products Anaphylaxis     Review of Systems  Constitutional: Negative for fever, chills, weight loss and malaise/fatigue.  HENT: Negative for hearing loss.   Eyes: Negative for blurred vision and double vision.  Respiratory: Negative for cough, shortness of breath and wheezing.   Cardiovascular: Negative for chest pain, palpitations and leg swelling.  Gastrointestinal: Negative for heartburn, abdominal pain and constipation.  Genitourinary: Negative for dysuria, urgency and frequency.  Musculoskeletal: Positive for joint pain (bilateral shoulders sec to surgery). Negative for back pain.  Skin: Negative for rash.  Neurological: Negative for dizziness, tremors, weakness and headaches.      Objective  Filed Vitals:   02/04/16 1403 02/04/16 1446  BP: 161/89 125/80  Pulse: 58   Temp: 98.1 F (36.7 C)   TempSrc: Oral  Resp: 16   Height: 5\' 10"  (1.778 m)   Weight: 205 lb (92.987 kg)     Physical Exam  Constitutional: He is oriented to person, place, and time and well-developed, well-nourished, and in no distress. No distress.  HENT:  Head: Normocephalic and atraumatic.  Eyes: Conjunctivae and EOM are normal. Pupils are equal, round, and reactive to light. No scleral icterus.  Neck: Normal range of motion. Neck supple. Carotid bruit is not present. No thyromegaly present.  Cardiovascular: Normal rate, regular rhythm and normal heart sounds.  Exam reveals no gallop and no friction rub.   No murmur heard. Pulmonary/Chest: Effort normal and breath sounds normal. No respiratory distress. He has no wheezes. He  has no rales.  Musculoskeletal: He exhibits no edema.  Lymphadenopathy:    He has no cervical adenopathy.  Neurological: He is alert and oriented to person, place, and time.  Psychiatric: Mood, memory, affect and judgment normal.  Vitals reviewed.      No results found for this or any previous visit (from the past 2160 hour(s)).   Assessment & Plan  Problem List Items Addressed This Visit      Cardiovascular and Mediastinum   Essential (primary) hypertension - Primary   Relevant Medications   chlorthalidone (HYGROTON) 25 MG tablet   lisinopril (PRINIVIL,ZESTRIL) 30 MG tablet     Other   Anxiety   Relevant Medications   DULoxetine (CYMBALTA) 30 MG capsule   Neuropathic pain   Relevant Medications   gabapentin (NEURONTIN) 300 MG capsule      Meds ordered this encounter  Medications  . gabapentin (NEURONTIN) 300 MG capsule    Sig: Take 1 tablet twice a day and 2 at bedtime.    Dispense:  120 capsule    Refill:  12  . chlorthalidone (HYGROTON) 25 MG tablet    Sig: Take 1 tablet (25 mg total) by mouth daily.    Dispense:  30 tablet    Refill:  12  . lisinopril (PRINIVIL,ZESTRIL) 30 MG tablet    Sig: Take 1 tablet (30 mg total) by mouth daily.    Dispense:  30 tablet    Refill:  12  . DULoxetine (CYMBALTA) 30 MG capsule    Sig: Take 1 capsule (30 mg total) by mouth daily.    Dispense:  30 capsule    Refill:  6    Needs appointment to continue to get this.     1. Essential (primary) hypertension  - chlorthalidone (HYGROTON) 25 MG tablet; Take 1 tablet (25 mg total) by mouth daily.  Dispense: 30 tablet; Refill: 12 - lisinopril (PRINIVIL,ZESTRIL) 30 MG tablet; Take 1 tablet (30 mg total) by mouth daily.  Dispense: 30 tablet; Refill: 12  2. Anxiety  - DULoxetine (CYMBALTA) 30 MG capsule; Take 1 capsule (30 mg total) by mouth daily.  Dispense: 30 capsule; Refill: 6  3. Neuropathic pain  - gabapentin (NEURONTIN) 300 MG capsule; Take 1 tablet twice a day and 2  at bedtime.  Dispense: 120 capsule; Refill: 12  4. Colon cancer screening  - Ambulatory referral to Gastroenterology

## 2016-02-29 ENCOUNTER — Emergency Department
Admission: EM | Admit: 2016-02-29 | Discharge: 2016-02-29 | Disposition: A | Discharge status: Home / Self Care | Payer: BC Managed Care – PPO | Attending: Emergency Medicine | Admitting: Emergency Medicine

## 2016-02-29 DIAGNOSIS — Z91013 Allergy to seafood: Secondary | ICD-10-CM | POA: Diagnosis not present

## 2016-02-29 DIAGNOSIS — G2581 Restless legs syndrome: Secondary | ICD-10-CM | POA: Insufficient documentation

## 2016-02-29 DIAGNOSIS — G4700 Insomnia, unspecified: Secondary | ICD-10-CM | POA: Diagnosis present

## 2016-02-29 DIAGNOSIS — I10 Essential (primary) hypertension: Secondary | ICD-10-CM | POA: Diagnosis not present

## 2016-02-29 DIAGNOSIS — Z79899 Other long term (current) drug therapy: Secondary | ICD-10-CM | POA: Insufficient documentation

## 2016-02-29 DIAGNOSIS — M19019 Primary osteoarthritis, unspecified shoulder: Secondary | ICD-10-CM | POA: Insufficient documentation

## 2016-02-29 LAB — CBC WITH DIFFERENTIAL/PLATELET
BASOS ABS: 0.1 10*3/uL (ref 0–0.1)
BASOS PCT: 1 %
Eosinophils Absolute: 0.1 10*3/uL (ref 0–0.7)
Eosinophils Relative: 2 %
HEMATOCRIT: 44.1 % (ref 40.0–52.0)
HEMOGLOBIN: 15.6 g/dL (ref 13.0–18.0)
Lymphocytes Relative: 33 %
Lymphs Abs: 2.8 10*3/uL (ref 1.0–3.6)
MCH: 32.1 pg (ref 26.0–34.0)
MCHC: 35.3 g/dL (ref 32.0–36.0)
MCV: 90.8 fL (ref 80.0–100.0)
MONO ABS: 0.6 10*3/uL (ref 0.2–1.0)
Monocytes Relative: 7 %
NEUTROS ABS: 4.8 10*3/uL (ref 1.4–6.5)
Neutrophils Relative %: 57 %
Platelets: 253 10*3/uL (ref 150–440)
RBC: 4.85 MIL/uL (ref 4.40–5.90)
RDW: 13.4 % (ref 11.5–14.5)
WBC: 8.4 10*3/uL (ref 3.8–10.6)

## 2016-02-29 LAB — URINE DRUG SCREEN, QUALITATIVE (ARMC ONLY)
AMPHETAMINES, UR SCREEN: NOT DETECTED
Barbiturates, Ur Screen: NOT DETECTED
Benzodiazepine, Ur Scrn: POSITIVE — AB
Cannabinoid 50 Ng, Ur ~~LOC~~: POSITIVE — AB
Cocaine Metabolite,Ur ~~LOC~~: NOT DETECTED
MDMA (ECSTASY) UR SCREEN: NOT DETECTED
METHADONE SCREEN, URINE: NOT DETECTED
Opiate, Ur Screen: NOT DETECTED
Phencyclidine (PCP) Ur S: NOT DETECTED
TRICYCLIC, UR SCREEN: NOT DETECTED

## 2016-02-29 LAB — BASIC METABOLIC PANEL
ANION GAP: 9 (ref 5–15)
BUN: 10 mg/dL (ref 6–20)
CO2: 29 mmol/L (ref 22–32)
Calcium: 9.7 mg/dL (ref 8.9–10.3)
Chloride: 99 mmol/L — ABNORMAL LOW (ref 101–111)
Creatinine, Ser: 0.86 mg/dL (ref 0.61–1.24)
Glucose, Bld: 130 mg/dL — ABNORMAL HIGH (ref 65–99)
POTASSIUM: 3 mmol/L — AB (ref 3.5–5.1)
SODIUM: 137 mmol/L (ref 135–145)

## 2016-02-29 LAB — TSH: TSH: 0.966 u[IU]/mL (ref 0.350–4.500)

## 2016-02-29 LAB — VITAMIN B12: VITAMIN B 12: 305 pg/mL (ref 180–914)

## 2016-02-29 LAB — FERRITIN: FERRITIN: 38 ng/mL (ref 24–336)

## 2016-02-29 LAB — MAGNESIUM: MAGNESIUM: 2.2 mg/dL (ref 1.7–2.4)

## 2016-02-29 MED ORDER — DIPHENHYDRAMINE HCL 50 MG/ML IJ SOLN
25.0000 mg | Freq: Once | INTRAMUSCULAR | Status: AC
  Administered 2016-02-29: 25 mg via INTRAVENOUS
  Filled 2016-02-29: qty 1

## 2016-02-29 MED ORDER — POTASSIUM CHLORIDE CRYS ER 20 MEQ PO TBCR
20.0000 meq | EXTENDED_RELEASE_TABLET | Freq: Once | ORAL | Status: AC
  Administered 2016-02-29: 20 meq via ORAL
  Filled 2016-02-29: qty 1

## 2016-02-29 MED ORDER — CARBIDOPA-LEVODOPA 25-100 MG PO TABS
2.0000 | ORAL_TABLET | Freq: Once | ORAL | Status: AC
  Administered 2016-02-29: 2 via ORAL
  Filled 2016-02-29: qty 2

## 2016-02-29 MED ORDER — SODIUM CHLORIDE 0.9 % IV BOLUS (SEPSIS)
500.0000 mL | Freq: Once | INTRAVENOUS | Status: AC
  Administered 2016-02-29: 500 mL via INTRAVENOUS

## 2016-02-29 MED ORDER — HALOPERIDOL LACTATE 5 MG/ML IJ SOLN
5.0000 mg | Freq: Once | INTRAMUSCULAR | Status: AC
  Administered 2016-02-29: 5 mg via INTRAVENOUS
  Filled 2016-02-29: qty 1

## 2016-02-29 MED ORDER — LORAZEPAM 2 MG/ML IJ SOLN
2.0000 mg | Freq: Once | INTRAMUSCULAR | Status: AC
  Administered 2016-02-29: 2 mg via INTRAVENOUS
  Filled 2016-02-29: qty 1

## 2016-02-29 MED ORDER — CARBIDOPA-LEVODOPA 25-100 MG PO TABS: 1.0000 | ORAL_TABLET | Freq: Once | ORAL | Status: DC

## 2016-02-29 MED ORDER — CARBIDOPA-LEVODOPA ER 50-200 MG PO TBCR: 1.0000 | EXTENDED_RELEASE_TABLET | Freq: Two times a day (BID) | ORAL | Status: DC

## 2016-02-29 MED ORDER — CARBIDOPA-LEVODOPA 25-100 MG PO TABS: 1.0000 | ORAL_TABLET | Freq: Three times a day (TID) | ORAL | Status: DC | PRN

## 2016-02-29 MED ORDER — KETAMINE HCL 10 MG/ML IJ SOLN
0.3000 mg/kg | Freq: Once | INTRAMUSCULAR | Status: DC
  Filled 2016-02-29: qty 2.7

## 2016-02-29 NOTE — ED Provider Notes (Signed)
Time Seen: Approximately *0 7:30 I have reviewed the triage notes  Chief Complaint: Insomnia and Leg Pain   History of Present Illness: Stephen Ray is a 50 y.o. male who states that he's had a history of difficulty with sleeping with a history of arthritis and postoperative from shoulder appear. Patient's had different variations of tendinitis and has had previous SURGERY on the right shoulder is currently undergoing rehabilitation on the left shoulder. The patient states that he has had difficulty sleeping over the last 5 days. He states when ever he starts to doze off he gets a tingling sensation in his arms and legs and feels like "" spiders crawling on me "". Patient states these feelings keep him awake and he's had difficulty maintaining any consistent sleep. He has had episodes similar to this before in the past and is currently on Neurontin and Cymbalta. He states these medications are not helping him at this point he is very frustrated that he's not had any sleep for the last several days. He states he does not want to take any narcotic pain medication. Record shows issues with chronic pain and psychosocial dysfunction. He also has a record of having anxiety etc.   Past Medical History  Diagnosis Date  . Shoulder pain     left and right  . Hypertension   . Anxiety   . Arthritis     Patient Active Problem List   Diagnosis Date Noted  . Status post left shoulder hemiarthroplasty 08/20/2015  . Anxiety 03/30/2015  . AC joint pain 03/30/2015  . Essential (primary) hypertension 03/30/2015  . Drug abuse, opioid type 03/30/2015  . Arthritis of shoulder region, degenerative 03/30/2015  . Calculus of kidney 03/30/2015  . Arthritis or polyarthritis, rheumatoid ([place]) 03/30/2015  . Acute anxiety 03/30/2015  . Neuropathic pain 08/29/2013  . Biceps tendinitis 01/28/2013  . Bursitis of shoulder 01/28/2013  . Calculi, ureter 01/17/2013  . Chronic pain associated with significant  psychosocial dysfunction 01/17/2013  . Polypharmacy 01/17/2013  . Speech disorder 01/17/2013  . Renal colic [id number]    Past Surgical History  Procedure Laterality Date  . Shoulder surgery Right 12/25/2014  . Cervical fusion    . Lithotripsy    . Hernia repair    . Total shoulder arthroplasty Right   . Shoulder hemi-arthroplasty Left 08/20/2015    Procedure: SHOULDER HEMI-ARTHROPLASTY;  Surgeon: Corky Mull, MD;  Location: [facility] ORS;  Service: Orthopedics;  Laterality: Left;    Past Surgical History  Procedure Laterality Date  . Shoulder surgery Right 12/25/2014  . Cervical fusion    . Lithotripsy    . Hernia repair    . Total shoulder arthroplasty Right   . Shoulder hemi-arthroplasty Left 08/20/2015    Procedure: SHOULDER HEMI-ARTHROPLASTY;  Surgeon: Corky Mull, MD;  Location: [facility] ORS;  Service: Orthopedics;  Laterality: Left;    Current Outpatient Rx  Name  Route  Sig  Dispense  Refill  . chlorthalidone (HYGROTON) 25 MG tablet   Oral   Take 1 tablet (25 mg total) by mouth daily.   30 tablet   12   . DULoxetine (CYMBALTA) 30 MG capsule   Oral   Take 1 capsule (30 mg total) by mouth daily.   30 capsule   6     Needs appointment to continue to get this.   Marland Kitchen gabapentin (NEURONTIN) 300 MG capsule      Take 1 tablet twice a day and 2 at bedtime.  120 capsule   12   . lidocaine (LIDODERM) 5 %   Transdermal   Place onto the skin.         Marland Kitchen lisinopril (PRINIVIL,ZESTRIL) 30 MG tablet   Oral   Take 1 tablet (30 mg total) by mouth daily.   30 tablet   12     Allergies:  Shellfish allergy and Shellfish-derived products  Family History: Family History  Problem Relation Age of Onset  . Family history unknown: Yes    Social History: Social History  Substance Use Topics  . Smoking status: Never Smoker   . Smokeless tobacco: Never Used  . Alcohol Use: 0.0 oz/week    0 Standard drinks or equivalent per week     Review of Systems:   10 point review  of systems was performed and was otherwise negative:  Constitutional: No fever Eyes: No visual disturbances ENT: No sore throat, ear pain Cardiac: No chest pain Respiratory: No shortness of breath, wheezing, or stridor Abdomen: No abdominal pain, no vomiting, No diarrhea Endocrine: No weight loss, No night sweats Extremities: No peripheral edema, cyanosis Skin: No rashes, easy bruising Neurologic: No focal weakness, trouble with speech or swollowing Urologic: No dysuria, Hematuria, or urinary frequency   Physical Exam:  ED Triage Vitals  Enc Vitals Group     BP 02/29/16 0620 179/110 mmHg     Pulse Rate 02/29/16 0620 78     Resp 02/29/16 0620 20     Temp 02/29/16 0620 98.4 F (36.9 C)     Temp Source 02/29/16 0620 Oral     SpO2 02/29/16 0620 96 %     Weight 02/29/16 0620 200 lb (90.719 kg)     Height 02/29/16 0620 5' 10.5" (1.791 m)     Head Cir --      Peak Flow --      Pain Score --      Pain Loc --      Pain Edu? --      Excl. in [place]? --     General: Awake , Alert , and Oriented times 3; GCS 15 Very agitated Head: Normal cephalic , atraumatic Eyes: Pupils equal , round, reactive to light Nose/Throat: No nasal drainage, patent upper airway without erythema or exudate.  Neck: Supple, Full range of motion, No anterior adenopathy or palpable thyroid masses Lungs: Clear to ascultation without wheezes , rhonchi, or rales Heart: Regular rate, regular rhythm without murmurs , gallops , or rubs Abdomen: Soft, non tender without rebound, guarding , or rigidity; bowel sounds positive and symmetric in all 4 quadrants. No organomegaly .        Extremities: 2 plus symmetric pulses. No edema, clubbing or cyanosis Neurologic: normal ambulation, Motor symmetric without deficits, sensory intact Skin: warm, dry, no rashes   Labs:   All laboratory work was reviewed including any pertinent negatives or positives listed below:  Labs Reviewed  [place]  CBC  WITH DIFFERENTIAL/PLATELET  Patient has some hypokalemia; otherwise his laboratory work was fine.    ED Course: * Patient appears to have a form of peripheral neuropathy with restless leg syndrome. We attempted sedating drugs such as Ativan, IV Benadryl, and Haldol without any successful relief of the patient's symptoms. He states that he's been on Requip and some other medications which have also been ineffective. He has not seen a neurologist up to this time. The patient and his wife requested neurology consult. I spoke to Dr. Doy Mince and  she agreed to see and evaluate the patient. Patient was going to leave the was convinced to stay until Dr. Doy Mince could see him as the medications mentioned above has little or no effect and is essentially made his restless leg syndrome worse. She was seen and evaluated by Dr. Doy Mince please see the above note and the patient was given Sinemet and I wrote for a prescription on an outpatient basis for his Sinemet. I suggested while patient was here because he seemed very angry and agitated and has a history of anxiety; whether or not he wanted to have a psychiatric consult. I simply felt that they would help him with his agitation and what seemed to be some underlying anger and frustration. This apparently very much agitated the patient is very confrontational with me at the bedside about this suggestion that this might be a psychiatric issue. I simply told him that I felt it would help him cope with his restless leg syndrome and didn't mean for him to require inpatient management etc. Patient was very confrontational with me in the room and I decided to leave at this point and went retrieved his prescription and discharged him from the room.   Assessment:  1. Restless leg syndrome #2. Behavior disorder      Plan: * Outpatient management Patient was given a prescription for Sinemet and after receiving a bolus dosage here in emergency department Patient was  advised to return immediately if condition worsens. Patient was advised to follow up with their primary care physician or other specialized physicians involved in their outpatient care. The patient and/or family member/power of attorney had laboratory results reviewed at the bedside. All questions and concerns were addressed and appropriate discharge instructions were distributed by the nursing staff.            Daymon Larsen, MD 02/29/16 1540

## 2016-02-29 NOTE — Consult Note (Addendum)
Reason for Consult:RLS Referring Physician: Marcelene Butte  CC: RLS  HPI: Stephen Ray is an 50 y.o. male with a history of multiple surgeries.  Reports that he has a history coming off medications after surgery and having symptoms of RLS.  These usually resolve after a period of time but this time they have not resolved.  He reports a sensation of spiders crawling on his arms and legs.  Sensation comes whenever he tries to go to sleep.  Has not slept in the past 5 days.  Has been given Haldol, Benadryl and Ativan in the ED and feels no better.    Past Medical History  Diagnosis Date  . Shoulder pain     left and right  . Hypertension   . Anxiety   . Arthritis     Past Surgical History  Procedure Laterality Date  . Shoulder surgery Right 12/25/2014  . Cervical fusion    . Lithotripsy    . Hernia repair    . Total shoulder arthroplasty Right   . Shoulder hemi-arthroplasty Left 08/20/2015    Procedure: SHOULDER HEMI-ARTHROPLASTY;  Surgeon: Corky Mull, MD;  Location: [facility] ORS;  Service: Orthopedics;  Laterality: Left;    Family History  Problem Relation Age of Onset  . Family history unknown: Yes    Social History:  reports that he has never smoked. He has never used smokeless tobacco. He reports that he drinks alcohol. He reports that he does not use illicit drugs.  Allergies  Allergen Reactions  . Shellfish Allergy Anaphylaxis  . Shellfish-Derived Products Anaphylaxis    Medications: I have reviewed the patient's current medications. Prior to Admission:  Prior to Admission medications   Medication Sig Start Date End Date Taking? Authorizing Provider  chlorthalidone (HYGROTON) 25 MG tablet Take 1 tablet (25 mg total) by mouth daily. 02/04/16  Yes Arlis Porta., MD  DULoxetine (CYMBALTA) 30 MG capsule Take 1 capsule (30 mg total) by mouth daily. 02/04/16  Yes Arlis Porta., MD  gabapentin (NEURONTIN) 300 MG capsule Take 1 tablet twice a day and 2 at bedtime. Patient  taking differently: Take 300-600 mg by mouth 3 (three) times daily. 300 mg every morning, 300 mg every afternoon, and 600 mg at bedtime 02/04/16  Yes Arlis Porta., MD  ibuprofen (ADVIL,MOTRIN) 200 MG tablet Take 600 mg by mouth daily as needed for moderate pain.   Yes Historical Provider, MD  lidocaine (LIDODERM) 5 % Place 2 patches onto the skin daily. 1 patch to each shoulder   Yes Historical Provider, MD  lisinopril (PRINIVIL,ZESTRIL) 30 MG tablet Take 1 tablet (30 mg total) by mouth daily. 02/04/16  Yes Arlis Porta., MD  carbidopa-levodopa (SINEMET) 25-100 MG tablet Take 1 tablet by mouth 3 (three) times daily as needed. 02/29/16 02/28/17  Daymon Larsen, MD    ROS: History obtained from the patient  General ROS: negative for - chills, fatigue, fever, night sweats, weight gain or weight loss Psychological ROS: anxiety Ophthalmic ROS: negative for - blurry vision, double vision, eye pain or loss of vision ENT ROS: negative for - epistaxis, nasal discharge, oral lesions, sore throat, tinnitus or vertigo Allergy and Immunology ROS: negative for - hives or itchy/watery eyes Hematological and Lymphatic ROS: negative for - bleeding problems, bruising or swollen lymph nodes Endocrine ROS: negative for - galactorrhea, hair pattern changes, polydipsia/polyuria or temperature intolerance Respiratory ROS: negative for - cough, hemoptysis, shortness of breath or wheezing Cardiovascular ROS: negative  for - chest pain, dyspnea on exertion, edema or irregular heartbeat Gastrointestinal ROS: negative for - abdominal pain, diarrhea, hematemesis, nausea/vomiting or stool incontinence Genito-Urinary ROS: negative for - dysuria, hematuria, incontinence or urinary frequency/urgency Musculoskeletal ROS: negative for - joint swelling or muscular weakness Neurological ROS: as noted in HPI Dermatological ROS: negative for rash and skin lesion changes  Physical Examination: Blood pressure 157/110,  pulse 69, temperature 98.4 F (36.9 C), temperature source Oral, resp. rate 15, height 5' 10.5" (1.791 m), weight 90.719 kg (200 lb), SpO2 97 %.  HEENT-  Normocephalic, no lesions, without obvious abnormality.  Normal external eye and conjunctiva.  Normal TM's bilaterally.  Normal auditory canals and external ears. Normal external nose, mucus membranes and septum.  Normal pharynx. Cardiovascular- S1, S2 normal, pulses palpable throughout   Lungs- chest clear, no wheezing, rales, normal symmetric air entry Abdomen- soft, non-tender; bowel sounds normal; no masses,  no organomegaly Extremities- no edema Lymph-no adenopathy palpable Musculoskeletal-no joint tenderness, deformity or swelling Skin-warm and dry, no hyperpigmentation, vitiligo, or suspicious lesions  Neurological Examination Mental Status: Alert, oriented, thought content appropriate.  Speech fluent without evidence of aphasia.  Able to follow 3 step commands without difficulty. Cranial Nerves: II: Discs flat bilaterally; Visual fields grossly normal, pupils equal, round, reactive to light and accommodation III,IV, VI: ptosis not present, extra-ocular motions intact bilaterally V,VII: smile symmetric, facial light touch sensation normal bilaterally VIII: hearing normal bilaterally IX,X: gag reflex present XI: bilateral shoulder shrug XII: midline tongue extension Motor: Right : Upper extremity   5/5    Left:     Upper extremity   5/5  Lower extremity   5/5     Lower extremity   5/5 Tone and bulk:normal tone throughout; no atrophy noted Sensory: Pinprick and light touch intact throughout, bilaterally Deep Tendon Reflexes: 2+ and symmetric throughout Plantars: Right: downgoing   Left: downgoing Cerebellar: normal finger-to-nose testing bilaterally Gait: normal gait and station    Laboratory Studies:   Basic Metabolic Panel:  Recent Labs Lab 02/29/16 0758  NA 137  K 3.0*  CL 99*  CO2 29  GLUCOSE 130*  BUN 10   CREATININE 0.86  CALCIUM 9.7  MG 2.2    Liver Function Tests: No results for input(s): AST, ALT, ALKPHOS, BILITOT, PROT, ALBUMIN in the last 168 hours. No results for input(s): LIPASE, AMYLASE in the last 168 hours. No results for input(s): AMMONIA in the last 168 hours.  CBC:  Recent Labs Lab 02/29/16 0758  WBC 8.4  NEUTROABS 4.8  HGB 15.6  HCT 44.1  MCV 90.8  PLT 253    Cardiac Enzymes: No results for input(s): CKTOTAL, CKMB, CKMBINDEX, TROPONINI in the last 168 hours.  BNP: Invalid input(s): POCBNP  CBG: No results for input(s): GLUCAP in the last 168 hours.  Microbiology: Results for orders placed or performed during the hospital encounter of 08/05/15  Surgical pcr screen     Status: None   Collection Time: 08/05/15 10:36 AM  Result Value Ref Range Status   MRSA, PCR NEGATIVE NEGATIVE Final   Staphylococcus aureus NEGATIVE NEGATIVE Final    Comment:        The Xpert SA Assay (FDA approved for NASAL specimens in patients over 81 years of age), is one component of a comprehensive surveillance program.  Test performance has been validated by [facility] for patients greater than or equal to 53 year old. It is not intended to diagnose infection nor to guide or monitor treatment.  Coagulation Studies: No results for input(s): LABPROT, INR in the last 72 hours.  Urinalysis: No results for input(s): COLORURINE, LABSPEC, PHURINE, GLUCOSEU, HGBUR, BILIRUBINUR, KETONESUR, PROTEINUR, UROBILINOGEN, NITRITE, LEUKOCYTESUR in the last 168 hours.  Invalid input(s): APPERANCEUR  Lipid Panel:  No results found for: CHOL, TRIG, HDL, CHOLHDL, VLDL, LDLCALC  HgbA1C: No results found for: HGBA1C  Urine Drug Screen:     Component Value Date/Time   LABOPIA NONE DETECTED 02/29/2016 0859   COCAINSCRNUR NONE DETECTED 02/29/2016 0859   LABBENZ POSITIVE* 02/29/2016 0859   AMPHETMU NONE DETECTED 02/29/2016 0859   THCU POSITIVE* 02/29/2016 0859   LABBARB NONE  DETECTED 02/29/2016 0859    Alcohol Level: No results for input(s): ETH in the last 168 hours.   Imaging: No results found.   Assessment/Plan: 50 year old male with RLS.  Has not tried Sinemet in the past.  Will attempt this for immediate relief and for use in the future at home since he can take the Sinemet prn and not have to wait multiple days to get a level in his system.  Will check for some predisposing factors as well.    Recommendations: 1.  Sinemet 50/200 IR now 2.  Patient to take Sinemet 25/100 1-2 tablets q 8 hours prn on an outpatient basis 3.  Ferritin, TSH, B12-results to be followed up on an outpatient basis 4.  Follow up with neurology on an outpatient basis.    Case discussed with Dr. Tobias Alexander, MD Neurology [phone] 02/29/2016, 10:48 AM

## 2016-02-29 NOTE — ED Notes (Addendum)
Pt called this RN into room, states "He is miserable", pt pacing in room, pt informed that pain medicine at this moment is on hold per Dr. Marcelene Butte until neurology is able to come assess pt, explained to pt if he is sedated when neurology arrives then MD will not able to get clear assessment of his symptoms, pt states understanding but states "Everything is making it worse", EDP notified

## 2016-02-29 NOTE — ED Notes (Signed)
Pharmacy called for Ketamine

## 2016-02-29 NOTE — ED Notes (Signed)
Upon discharge pt followed MD into hallway, yelling profanity and asking for prescriptions, MD walked out of room and asked pt to go back into room, pt continued to raise his voice and yell

## 2016-02-29 NOTE — ED Notes (Signed)
Pt pacing in room, jumping up and down from bed, states the medicine is intensfying the restless leg feeling, wife at bedside

## 2016-02-29 NOTE — ED Notes (Signed)
EDP at bedside  

## 2016-02-29 NOTE — Discharge Instructions (Signed)
Restless Legs Syndrome Restless legs syndrome is a condition that causes uncomfortable feelings or sensations in the legs, especially while sitting or lying down. The sensations usually cause an overwhelming urge to move the legs. The arms can also sometimes be affected. The condition can range from mild to severe. The symptoms often interfere with a person's ability to sleep. CAUSES The cause of this condition is not known. RISK FACTORS This condition is more likely to develop in:  People who are older than age 58.  Pregnant women. In general, restless legs syndrome is more common in women than in men.  People who have a family history of the condition.  People who have certain medical conditions, such as iron deficiency, kidney disease, Parkinson disease, or nerve damage.  People who take certain medicines, such as medicines for high blood pressure, nausea, colds, allergies, depression, and some heart conditions. SYMPTOMS The main symptom of this condition is uncomfortable sensations in the legs. These sensations may be:  Described as pulling, tingling, prickling, throbbing, crawling, or burning.  Worse while you are sitting or lying down.  Worse during periods of rest or inactivity.  Worse at night, often interfering with your sleep.  Accompanied by a very strong urge to move your legs.  Temporarily relieved by movement of your legs. The sensations usually affect both sides of the body. The arms can also be affected, but this is rare. People who have this condition often have tiredness during the day because of their lack of sleep at night. DIAGNOSIS This condition may be diagnosed based on your description of the symptoms. You may also have tests, including blood tests, to check for other conditions that may lead to your symptoms. In some cases, you may be asked to spend some time in a sleep lab so your sleeping can be monitored. TREATMENT Treatment for this condition is  focused on managing the symptoms. Treatment may include:  Self-help and lifestyle changes.  Medicines. HOME CARE INSTRUCTIONS  Take medicines only as directed by your health care provider.  Try these methods to get temporary relief from the uncomfortable sensations:  Massage your legs.  Walk or stretch.  Take a cold or hot bath.  Practice good sleep habits. For example, go to bed and get up at the same time every day.  Exercise regularly.  Practice ways of relaxing, such as yoga or meditation.  Avoid caffeine and alcohol.  Do not use any tobacco products, including cigarettes, chewing tobacco, or electronic cigarettes. If you need help quitting, ask your health care provider.  Keep all follow-up visits as directed by your health care provider. This is important. SEEK MEDICAL CARE IF: Your symptoms do not improve with treatment, or they get worse.   This information is not intended to replace advice given to you by your health care provider. Make sure you discuss any questions you have with your health care provider.   Document Released: 08/26/2002 Document Revised: 01/20/2015 Document Reviewed: 09/01/2014 Elsevier Interactive Patient Education [employer].   Please return immediately if condition worsens. Please contact her primary physician or the physician you were given for referral. If you have any specialist physicians involved in her treatment and plan please also contact them. Thank you for using [facility].

## 2016-02-29 NOTE — ED Notes (Signed)
Pt moving legs in bed constantly, MD aware

## 2016-02-29 NOTE — ED Notes (Signed)
Pt called this RN to bedside, states they want to cancel neurology consult and get pain meds, EDP notified, as conversation was occuring Dr. Doy Mince walked into pt room, neurology at bedside

## 2016-02-29 NOTE — ED Notes (Signed)
Per Dr. Marcelene Butte Ketamine to be on hold until neurology sees pt

## 2016-02-29 NOTE — ED Notes (Signed)
Patient reports he hasn't slept in 5 nights because of tingling/strange feeling in his legs.

## 2016-03-14 ENCOUNTER — Telehealth: Payer: Self-pay | Admitting: Family Medicine

## 2016-03-14 NOTE — Telephone Encounter (Signed)
Returned call and left contact information on voicemail.[place]

## 2016-03-14 NOTE — Telephone Encounter (Signed)
Pt called to check the status of the colonscopy with Dr. Allen Norris.  Please call [phone]

## 2016-03-17 ENCOUNTER — Telehealth: Payer: Self-pay

## 2016-03-17 ENCOUNTER — Other Ambulatory Visit: Payer: Self-pay

## 2016-03-17 NOTE — Telephone Encounter (Signed)
Gastroenterology Pre-Procedure Review  Request Date: 03/31/16 Requesting Physician: Dr. Luan Pulling  PATIENT REVIEW QUESTIONS: The patient responded to the following health history questions as indicated:    1. Are you having any GI issues? no 2. Do you have a personal history of Polyps? no 3. Do you have a family history of Colon Cancer or Polyps? no 4. Diabetes Mellitus? no 5. Joint replacements in the past 12 months?yes (Bilateral shoulder replacements ) 6. Major health problems in the past 3 months?no 7. Any artificial heart valves, MVP, or defibrillator?no    MEDICATIONS & ALLERGIES:    Patient reports the following regarding taking any anticoagulation/antiplatelet therapy:   Plavix, Coumadin, Eliquis, Xarelto, Lovenox, Pradaxa, Brilinta, or Effient? no Aspirin? no  Patient confirms/reports the following medications:  Current Outpatient Prescriptions  Medication Sig Dispense Refill  . carbidopa-levodopa (SINEMET) 25-100 MG tablet Take 1 tablet by mouth 3 (three) times daily as needed. 60 tablet 2  . chlorthalidone (HYGROTON) 25 MG tablet Take 1 tablet (25 mg total) by mouth daily. 30 tablet 12  . DULoxetine (CYMBALTA) 30 MG capsule Take 1 capsule (30 mg total) by mouth daily. 30 capsule 6  . gabapentin (NEURONTIN) 300 MG capsule Take 1 tablet twice a day and 2 at bedtime. (Patient taking differently: Take 300-600 mg by mouth 3 (three) times daily. 300 mg every morning, 300 mg every afternoon, and 600 mg at bedtime) 120 capsule 12  . ibuprofen (ADVIL,MOTRIN) 200 MG tablet Take 600 mg by mouth daily as needed for moderate pain.    Marland Kitchen lidocaine (LIDODERM) 5 % Place 2 patches onto the skin daily. 1 patch to each shoulder    . lisinopril (PRINIVIL,ZESTRIL) 30 MG tablet Take 1 tablet (30 mg total) by mouth daily. 30 tablet 12   No current facility-administered medications for this visit.    Patient confirms/reports the following allergies:  Allergies  Allergen Reactions  . Shellfish  Allergy Anaphylaxis  . Shellfish-Derived Products Anaphylaxis    No orders of the defined types were placed in this encounter.    AUTHORIZATION INFORMATION Primary Insurance: 1D#: Group #:  Secondary Insurance: 1D#: Group #:  SCHEDULE INFORMATION: Date: 03/31/16 Time: Location: [place]

## 2016-03-24 ENCOUNTER — Encounter: Payer: Self-pay | Admitting: *Deleted

## 2016-03-24 ENCOUNTER — Ambulatory Visit (INDEPENDENT_AMBULATORY_CARE_PROVIDER_SITE_OTHER): Payer: BC Managed Care – PPO | Admitting: Family Medicine

## 2016-03-24 ENCOUNTER — Telehealth: Payer: Self-pay

## 2016-03-24 ENCOUNTER — Ambulatory Visit: Payer: BC Managed Care – PPO | Admitting: Family Medicine

## 2016-03-24 ENCOUNTER — Encounter: Payer: Self-pay | Admitting: Family Medicine

## 2016-03-24 VITALS — BP 146/100 | HR 77 | Temp 98.0°F | Resp 16 | Ht 70.0 in | Wt 206.0 lb

## 2016-03-24 DIAGNOSIS — R590 Localized enlarged lymph nodes: Secondary | ICD-10-CM

## 2016-03-24 DIAGNOSIS — R599 Enlarged lymph nodes, unspecified: Secondary | ICD-10-CM | POA: Diagnosis not present

## 2016-03-24 DIAGNOSIS — G2581 Restless legs syndrome: Secondary | ICD-10-CM | POA: Diagnosis not present

## 2016-03-24 DIAGNOSIS — I10 Essential (primary) hypertension: Secondary | ICD-10-CM | POA: Diagnosis not present

## 2016-03-24 LAB — COMPLETE METABOLIC PANEL WITH GFR
ALBUMIN: 4.4 g/dL (ref 3.6–5.1)
ALK PHOS: 77 U/L (ref 40–115)
ALT: 23 U/L (ref 9–46)
AST: 23 U/L (ref 10–35)
BILIRUBIN TOTAL: 0.5 mg/dL (ref 0.2–1.2)
BUN: 11 mg/dL (ref 7–25)
CO2: 28 mmol/L (ref 20–31)
CREATININE: 0.78 mg/dL (ref 0.70–1.33)
Calcium: 9.1 mg/dL (ref 8.6–10.3)
Chloride: 101 mmol/L (ref 98–110)
GLUCOSE: 104 mg/dL — AB (ref 65–99)
Potassium: 3.2 mmol/L — ABNORMAL LOW (ref 3.5–5.3)
Sodium: 139 mmol/L (ref 135–146)
TOTAL PROTEIN: 6.9 g/dL (ref 6.1–8.1)

## 2016-03-24 LAB — CBC WITH DIFFERENTIAL/PLATELET
BASOS ABS: 60 {cells}/uL (ref 0–200)
Basophils Relative: 1 %
EOS ABS: 180 {cells}/uL (ref 15–500)
Eosinophils Relative: 3 %
HEMATOCRIT: 42.1 % (ref 38.5–50.0)
Hemoglobin: 14.7 g/dL (ref 13.2–17.1)
LYMPHS PCT: 38 %
Lymphs Abs: 2280 cells/uL (ref 850–3900)
MCH: 31.7 pg (ref 27.0–33.0)
MCHC: 34.9 g/dL (ref 32.0–36.0)
MCV: 90.7 fL (ref 80.0–100.0)
MONO ABS: 480 {cells}/uL (ref 200–950)
MONOS PCT: 8 %
MPV: 9.5 fL (ref 7.5–12.5)
Neutro Abs: 3000 cells/uL (ref 1500–7800)
Neutrophils Relative %: 50 %
PLATELETS: 256 10*3/uL (ref 140–400)
RBC: 4.64 MIL/uL (ref 4.20–5.80)
RDW: 13.5 % (ref 11.0–15.0)
WBC: 6 10*3/uL (ref 3.8–10.8)

## 2016-03-24 LAB — LIPID PANEL
CHOL/HDL RATIO: 4.4 ratio (ref <=5.0)
Cholesterol: 182 mg/dL (ref 125–200)
HDL: 41 mg/dL (ref >=40)
LDL CALC: 79 mg/dL (ref <130)
TRIGLYCERIDES: 309 mg/dL — AB (ref <150)
VLDL: 62 mg/dL — AB (ref <30)

## 2016-03-24 LAB — IRON AND TIBC
%SAT: 27 % (ref 15–60)
Iron: 92 ug/dL (ref 50–180)
TIBC: 339 ug/dL (ref 250–425)
UIBC: 247 ug/dL (ref 125–400)

## 2016-03-24 LAB — FERRITIN: Ferritin: 38 ng/mL (ref 20–380)

## 2016-03-24 MED ORDER — NA SULFATE-K SULFATE-MG SULF 17.5-3.13-1.6 GM/177ML PO SOLN: 1.0000 | ORAL | Status: DC

## 2016-03-24 NOTE — Progress Notes (Signed)
Subjective:    Patient ID: Stephen Ray, male    DOB: 1965/10/21, 50 y.o.   MRN: [id number]  HPI: Stephen Ray is a 50 y.o. male presenting on 03/24/2016 for knot on neck   HPI  Pt presents for knot on the neck. Presented over the weekend when he was out in the sun and sweating. He was out working and sweating. R side. 2 little knots- tender and very swollen. Went down yesterday when he was inside. No scalp lesions or wounds.  Is having RLS symptoms from going off opioids. Taking Sinemet- has been helping his symptoms.  Would like to have blood work drawn today.  BP: Doing well at home. Mildy elevated at home. Checks at home. Avg- 130-140/90-100. No HA, no CP, no visual changes.   Past Medical History  Diagnosis Date  . Shoulder pain     left and right  . Hypertension   . Anxiety   . Arthritis     Current Outpatient Prescriptions on File Prior to Visit  Medication Sig  . carbidopa-levodopa (SINEMET) 25-100 MG tablet Take 1 tablet by mouth 3 (three) times daily as needed. (Patient taking differently: Take 1 tablet by mouth at bedtime. )  . chlorthalidone (HYGROTON) 25 MG tablet Take 1 tablet (25 mg total) by mouth daily.  . DULoxetine (CYMBALTA) 30 MG capsule Take 1 capsule (30 mg total) by mouth daily.  Marland Kitchen gabapentin (NEURONTIN) 300 MG capsule Take 1 tablet twice a day and 2 at bedtime. (Patient taking differently: Take 300-600 mg by mouth 3 (three) times daily. 300 mg every morning, 300 mg every afternoon, and 600 mg at bedtime)  . ibuprofen (ADVIL,MOTRIN) 200 MG tablet Take 600 mg by mouth daily as needed for moderate pain.  Marland Kitchen lidocaine (LIDODERM) 5 % Place 2 patches onto the skin daily. 1 patch to each shoulder  . lisinopril (PRINIVIL,ZESTRIL) 30 MG tablet Take 1 tablet (30 mg total) by mouth daily.   No current facility-administered medications on file prior to visit.    Review of Systems  Constitutional: Negative for fever and chills.  HENT: Negative.   Respiratory:  Negative for chest tightness, shortness of breath and wheezing.   Cardiovascular: Negative for chest pain, palpitations and leg swelling.  Gastrointestinal: Negative for nausea, vomiting and abdominal pain.  Endocrine: Negative.   Genitourinary: Negative for dysuria, urgency, discharge, penile pain and testicular pain.  Musculoskeletal: Negative for back pain, joint swelling and arthralgias.       Restless leg symptoms    Skin: Negative.   Neurological: Negative for dizziness, weakness, numbness and headaches.  Hematological: Positive for adenopathy.  Psychiatric/Behavioral: Negative for sleep disturbance and dysphoric mood.   Per HPI unless specifically indicated above     Objective:    BP 146/100 mmHg  Pulse 77  Temp(Src) 98 F (36.7 C) (Oral)  Resp 16  Ht 5\' 10"  (1.778 m)  Wt 206 lb (93.441 kg)  BMI 29.56 kg/m2  Wt Readings from Last 3 Encounters:  03/24/16 206 lb (93.441 kg)  02/29/16 200 lb (90.719 kg)  02/04/16 205 lb (92.987 kg)    Physical Exam  Constitutional: He is oriented to person, place, and time. He appears well-developed and well-nourished. No distress.  HENT:  Head: Normocephalic and atraumatic.  Neck: Neck supple. No thyromegaly present.  Cardiovascular: Normal rate, regular rhythm and normal heart sounds.  Exam reveals no gallop and no friction rub.   No murmur heard. Pulmonary/Chest: Effort normal and breath sounds  normal. He has no wheezes.  Abdominal: Soft. Bowel sounds are normal. He exhibits no distension. There is no tenderness. There is no rebound.  Musculoskeletal: Normal range of motion. He exhibits no edema or tenderness.  Lymphadenopathy:       Head (right side): Occipital adenopathy present. No submental, no submandibular, no tonsillar, no preauricular and no posterior auricular adenopathy present.       Head (left side): No submental, no submandibular, no tonsillar, no preauricular, no posterior auricular and no occipital adenopathy present.    Small mobile, tender, nodes R side of head- pea sized.    Neurological: He is alert and oriented to person, place, and time. He has normal reflexes.  Skin: Skin is warm and dry. No rash noted. No erythema.  No scalp lesions or wounds.   Psychiatric: He has a normal mood and affect. His behavior is normal. Thought content normal.   Results for orders placed or performed during the hospital encounter of [id number]  Basic metabolic panel  Result Value Ref Range   Sodium 137 135 - 145 mmol/L   Potassium 3.0 (L) 3.5 - 5.1 mmol/L   Chloride 99 (L) 101 - 111 mmol/L   CO2 29 22 - 32 mmol/L   Glucose, Bld 130 (H) 65 - 99 mg/dL   BUN 10 6 - 20 mg/dL   Creatinine, Ser 0.86 0.61 - 1.24 mg/dL   Calcium 9.7 8.9 - 10.3 mg/dL   GFR calc non Af Amer >60 >60 mL/min   GFR calc Af Amer >60 >60 mL/min   Anion gap 9 5 - 15  Magnesium  Result Value Ref Range   Magnesium 2.2 1.7 - 2.4 mg/dL  CBC with Differential/Platelet  Result Value Ref Range   WBC 8.4 3.8 - 10.6 K/uL   RBC 4.85 4.40 - 5.90 MIL/uL   Hemoglobin 15.6 13.0 - 18.0 g/dL   HCT 44.1 40.0 - 52.0 %   MCV 90.8 80.0 - 100.0 fL   MCH 32.1 26.0 - 34.0 pg   MCHC 35.3 32.0 - 36.0 g/dL   RDW 13.4 11.5 - 14.5 %   Platelets 253 150 - 440 K/uL   Neutrophils Relative % 57 %   Neutro Abs 4.8 1.4 - 6.5 K/uL   Lymphocytes Relative 33 %   Lymphs Abs 2.8 1.0 - 3.6 K/uL   Monocytes Relative 7 %   Monocytes Absolute 0.6 0.2 - 1.0 K/uL   Eosinophils Relative 2 %   Eosinophils Absolute 0.1 0 - 0.7 K/uL   Basophils Relative 1 %   Basophils Absolute 0.1 0 - 0.1 K/uL  Urine Drug Screen, Qualitative ([facility] only)  Result Value Ref Range   Tricyclic, Ur Screen NONE DETECTED NONE DETECTED   Amphetamines, Ur Screen NONE DETECTED NONE DETECTED   MDMA (Ecstasy)Ur Screen NONE DETECTED NONE DETECTED   Cocaine Metabolite,Ur [place] NONE DETECTED NONE DETECTED   Opiate, Ur Screen NONE DETECTED NONE DETECTED   Phencyclidine (PCP) Ur S NONE DETECTED NONE DETECTED    Cannabinoid 50 Ng, Ur [place] POSITIVE (A) NONE DETECTED   Barbiturates, Ur Screen NONE DETECTED NONE DETECTED   Benzodiazepine, Ur Scrn POSITIVE (A) NONE DETECTED   Methadone Scn, Ur NONE DETECTED NONE DETECTED  TSH  Result Value Ref Range   TSH 0.966 0.350 - 4.500 uIU/mL  Vitamin B12  Result Value Ref Range   Vitamin B-12 305 180 - 914 pg/mL  Ferritin  Result Value Ref Range   Ferritin 38 24 - 336 ng/mL  Assessment & Plan:   Problem List Items Addressed This Visit      Cardiovascular and Mediastinum   Essential (primary) hypertension - Primary    BP is elevated today. Plan to recheck in 4 weeks and monitor closely at home. Consider increasing lisinopril at that time. Check CMP. Reviewed DASH diet.       Relevant Orders   COMPLETE METABOLIC PANEL WITH GFR   Lipid Profile    Other Visit Diagnoses    Occipital lymphadenopathy        Resolving. Consider scalp as source of infection. Will check CBC. Pt will report return of symptoms.     RLS (restless legs syndrome)        2/2 opioid withdrawal. Controlled with sinamet. Will check ferritin levels for contributing symptoms.     Relevant Orders    CBC with Differential/Platelet    Ferritin    Iron Binding Cap (TIBC)       No orders of the defined types were placed in this encounter.      Follow up plan: Return in about 4 weeks (around 04/21/2016) for BP check.Marland Kitchen

## 2016-03-24 NOTE — Telephone Encounter (Signed)
Patient did not get information on Colonoscopy or prep. Addressed verified. Will send information out again per patient request. I offered to print and have patient pick-up but he did not want to do this.   Sent Suprep to pharmacy and informed patient to call if this is too expensive.   He verbalizes understanding of this.

## 2016-03-24 NOTE — Telephone Encounter (Signed)
Address was verified with patient. Information sent to home address.

## 2016-03-24 NOTE — Assessment & Plan Note (Signed)
BP is elevated today. Plan to recheck in 4 weeks and monitor closely at home. Consider increasing lisinopril at that time. Check CMP. Reviewed DASH diet.

## 2016-03-24 NOTE — Patient Instructions (Addendum)
Your goal blood pressure is 140/90. Work on low salt/sodium diet - goal <1.5gm (1,500mg ) per day. Eat a diet high in fruits/vegetables and whole grains.  Look into mediterranean and DASH diet. Goal activity is 170min/wk of moderate intensity exercise.  This can be split into 30 minute chunks.  If you are not at this level, you can start with smaller 10-15 min increments and slowly build up activity. Look at [email or web address] for more resources  Please seek immediate medical attention at ER or Urgent Care if you develop: Chest pain, pressure or tightness. Shortness of breath accompanied by nausea or diaphoresis Visual changes Numbness or tingling on one side of the body Facial droop Altered mental status Or any concerning symptoms.  Keep an eye on the site. Try warm compresses to help with pain. Please call me if it swells up again.

## 2016-03-25 ENCOUNTER — Other Ambulatory Visit: Payer: Self-pay | Admitting: Family Medicine

## 2016-03-25 DIAGNOSIS — E876 Hypokalemia: Secondary | ICD-10-CM

## 2016-03-25 MED ORDER — POTASSIUM CHLORIDE ER 10 MEQ PO TBCR: 20.0000 meq | EXTENDED_RELEASE_TABLET | Freq: Every day | ORAL | Status: DC

## 2016-03-25 NOTE — Progress Notes (Signed)
Pt advised.

## 2016-03-30 NOTE — Discharge Instructions (Signed)

## 2016-03-31 ENCOUNTER — Ambulatory Visit: Payer: BC Managed Care – PPO | Admitting: Anesthesiology

## 2016-03-31 ENCOUNTER — Encounter
Admission: RE | Disposition: A | Discharge status: Home / Self Care | Payer: Self-pay | Source: Ambulatory Visit | Attending: Gastroenterology

## 2016-03-31 ENCOUNTER — Ambulatory Visit
Admission: RE | Admit: 2016-03-31 | Discharge: 2016-03-31 | Disposition: A | Discharge status: Home / Self Care | Payer: BC Managed Care – PPO | Source: Ambulatory Visit | Attending: Gastroenterology | Admitting: Gastroenterology

## 2016-03-31 DIAGNOSIS — Z791 Long term (current) use of non-steroidal anti-inflammatories (NSAID): Secondary | ICD-10-CM | POA: Diagnosis not present

## 2016-03-31 DIAGNOSIS — Z981 Arthrodesis status: Secondary | ICD-10-CM | POA: Diagnosis not present

## 2016-03-31 DIAGNOSIS — Z87891 Personal history of nicotine dependence: Secondary | ICD-10-CM | POA: Insufficient documentation

## 2016-03-31 DIAGNOSIS — Z87442 Personal history of urinary calculi: Secondary | ICD-10-CM | POA: Diagnosis not present

## 2016-03-31 DIAGNOSIS — Z91013 Allergy to seafood: Secondary | ICD-10-CM | POA: Diagnosis not present

## 2016-03-31 DIAGNOSIS — K641 Second degree hemorrhoids: Secondary | ICD-10-CM | POA: Insufficient documentation

## 2016-03-31 DIAGNOSIS — Z1211 Encounter for screening for malignant neoplasm of colon: Secondary | ICD-10-CM | POA: Insufficient documentation

## 2016-03-31 DIAGNOSIS — I10 Essential (primary) hypertension: Secondary | ICD-10-CM | POA: Diagnosis not present

## 2016-03-31 DIAGNOSIS — Z87892 Personal history of anaphylaxis: Secondary | ICD-10-CM | POA: Insufficient documentation

## 2016-03-31 DIAGNOSIS — F419 Anxiety disorder, unspecified: Secondary | ICD-10-CM | POA: Insufficient documentation

## 2016-03-31 DIAGNOSIS — D120 Benign neoplasm of cecum: Secondary | ICD-10-CM | POA: Diagnosis not present

## 2016-03-31 DIAGNOSIS — Z96611 Presence of right artificial shoulder joint: Secondary | ICD-10-CM | POA: Diagnosis not present

## 2016-03-31 DIAGNOSIS — Z9889 Other specified postprocedural states: Secondary | ICD-10-CM | POA: Insufficient documentation

## 2016-03-31 DIAGNOSIS — J45909 Unspecified asthma, uncomplicated: Secondary | ICD-10-CM | POA: Diagnosis not present

## 2016-03-31 DIAGNOSIS — Z79899 Other long term (current) drug therapy: Secondary | ICD-10-CM | POA: Insufficient documentation

## 2016-03-31 DIAGNOSIS — Z79891 Long term (current) use of opiate analgesic: Secondary | ICD-10-CM | POA: Insufficient documentation

## 2016-03-31 DIAGNOSIS — Z96612 Presence of left artificial shoulder joint: Secondary | ICD-10-CM | POA: Diagnosis not present

## 2016-03-31 HISTORY — PX: COLONOSCOPY WITH PROPOFOL: SHX5780

## 2016-03-31 HISTORY — PX: POLYPECTOMY: SHX5525

## 2016-03-31 HISTORY — DX: Motion sickness, initial encounter: T75.3XXA

## 2016-03-31 HISTORY — DX: Calculus of kidney: N20.0

## 2016-03-31 HISTORY — DX: Unspecified asthma, uncomplicated: J45.909

## 2016-03-31 SURGERY — COLONOSCOPY WITH PROPOFOL
Anesthesia: Monitor Anesthesia Care | Wound class: Contaminated

## 2016-03-31 MED ORDER — LACTATED RINGERS IV SOLN: INTRAVENOUS | Status: DC

## 2016-03-31 MED ORDER — ACETAMINOPHEN 160 MG/5ML PO SOLN: 325.0000 mg | ORAL | Status: DC | PRN

## 2016-03-31 MED ORDER — STERILE WATER FOR IRRIGATION IR SOLN
Status: DC | PRN
  Administered 2016-03-31: 10:00:00

## 2016-03-31 MED ORDER — PROPOFOL 10 MG/ML IV BOLUS
INTRAVENOUS | Status: DC | PRN
  Administered 2016-03-31: 100 mg via INTRAVENOUS

## 2016-03-31 MED ORDER — LACTATED RINGERS IV SOLN
INTRAVENOUS | Status: DC | PRN
  Administered 2016-03-31: 09:00:00 via INTRAVENOUS

## 2016-03-31 MED ORDER — ACETAMINOPHEN 325 MG PO TABS: 325.0000 mg | ORAL_TABLET | ORAL | Status: DC | PRN

## 2016-03-31 MED ORDER — ONDANSETRON HCL 4 MG/2ML IJ SOLN: 4.0000 mg | Freq: Once | INTRAMUSCULAR | Status: DC | PRN

## 2016-03-31 MED ORDER — LIDOCAINE HCL (CARDIAC) 20 MG/ML IV SOLN
INTRAVENOUS | Status: DC | PRN
  Administered 2016-03-31: 40 mg via INTRAVENOUS

## 2016-03-31 SURGICAL SUPPLY — 23 items
CANISTER SUCT 1200ML W/VALVE (MISCELLANEOUS) ×3 IMPLANT
CLIP HMST 235XBRD CATH ROT (MISCELLANEOUS) IMPLANT
CLIP RESOLUTION 360 11X235 (MISCELLANEOUS)
FCP ESCP3.2XJMB 240X2.8X (MISCELLANEOUS)
FORCEPS BIOP RAD 4 LRG CAP 4 (CUTTING FORCEPS) ×1 IMPLANT
FORCEPS BIOP RJ4 240 W/NDL (MISCELLANEOUS)
FORCEPS ESCP3.2XJMB 240X2.8X (MISCELLANEOUS) IMPLANT
GOWN CVR UNV OPN BCK APRN NK (MISCELLANEOUS) ×4 IMPLANT
GOWN ISOL THUMB LOOP REG UNIV (MISCELLANEOUS) ×6
INJECTOR VARIJECT VIN23 (MISCELLANEOUS) IMPLANT
KIT DEFENDO VALVE AND CONN (KITS) IMPLANT
KIT ENDO PROCEDURE OLY (KITS) ×3 IMPLANT
MARKER SPOT ENDO TATTOO 5ML (MISCELLANEOUS) IMPLANT
PAD GROUND ADULT SPLIT (MISCELLANEOUS) IMPLANT
PROBE APC STR FIRE (PROBE) IMPLANT
RETRIEVER NET ROTH 2.5X230 LF (MISCELLANEOUS) ×3 IMPLANT
SNARE SHORT THROW 13M SML OVAL (MISCELLANEOUS) IMPLANT
SNARE SHORT THROW 30M LRG OVAL (MISCELLANEOUS) IMPLANT
SNARE SNG USE RND 15MM (INSTRUMENTS) IMPLANT
SPOT EX ENDOSCOPIC TATTOO (MISCELLANEOUS)
TRAP ETRAP POLY (MISCELLANEOUS) IMPLANT
VARIJECT INJECTOR VIN23 (MISCELLANEOUS)
WATER STERILE IRR 250ML POUR (IV SOLUTION) ×3 IMPLANT

## 2016-03-31 NOTE — Anesthesia Preprocedure Evaluation (Signed)
Anesthesia Evaluation  Patient identified by MRN, date of birth, ID band Patient awake    Reviewed: Allergy & Precautions, NPO status , Patient's Chart, lab work & pertinent test results  Airway Mallampati: II  TM Distance: >3 FB Neck ROM: Full    Dental no notable dental hx.    Pulmonary asthma , former smoker,    Pulmonary exam normal        Cardiovascular hypertension, Pt. on medications Normal cardiovascular exam     Neuro/Psych Anxiety negative neurological ROS     GI/Hepatic negative GI ROS, Neg liver ROS,   Endo/Other    Renal/GU negative Renal ROS     Musculoskeletal  (+) Arthritis , Osteoarthritis,    Abdominal   Peds  Hematology negative hematology ROS (+)   Anesthesia Other Findings   Reproductive/Obstetrics                             Anesthesia Physical Anesthesia Plan  ASA: II  Anesthesia Plan: MAC   Post-op Pain Management:    Induction: Intravenous  Airway Management Planned:   Additional Equipment:   Intra-op Plan:   Post-operative Plan:   Informed Consent: I have reviewed the patients History and Physical, chart, labs and discussed the procedure including the risks, benefits and alternatives for the proposed anesthesia with the patient or authorized representative who has indicated his/her understanding and acceptance.     Plan Discussed with: CRNA  Anesthesia Plan Comments:         Anesthesia Quick Evaluation

## 2016-03-31 NOTE — Anesthesia Procedure Notes (Signed)
Procedure Name: MAC Performed by: ,  Pre-anesthesia Checklist: Patient identified, Emergency Drugs available, Suction available, Patient being monitored and Timeout performed Patient Re-evaluated:Patient Re-evaluated prior to inductionOxygen Delivery Method: Nasal cannula       

## 2016-03-31 NOTE — H&P (Signed)
Lucilla Lame, MD [address] [place], [place] [address] Phone: [phone] Fax : [phone]  Primary Care Physician:  Dicky Doe, MD Primary Gastroenterologist:  Dr. Allen Norris  Pre-Procedure History & Physical: HPI:  Stephen Ray is a 50 y.o. male is here for a screening colonoscopy.   Past Medical History  Diagnosis Date  . Shoulder pain     left and right  . Hypertension   . Anxiety   . Kidney stones   . Asthma     as child  . Motion sickness     deep sea fishing  . Arthritis     shoulders (before replacements)    Past Surgical History  Procedure Laterality Date  . Shoulder surgery Right 12/25/2014  . Cervical fusion  2001  . Lithotripsy  2002  . Total shoulder arthroplasty Right   . Shoulder hemi-arthroplasty Left 08/20/2015    Procedure: SHOULDER HEMI-ARTHROPLASTY;  Surgeon: Corky Mull, MD;  Location: [facility] ORS;  Service: Orthopedics;  Laterality: Left;  . Hernia repair  2005  . Lipoma excision Right 2012    under arm    Prior to Admission medications   Medication Sig Start Date End Date Taking? Authorizing Provider  carbidopa-levodopa (SINEMET) 25-100 MG tablet Take 1 tablet by mouth 3 (three) times daily as needed. Patient taking differently: Take 1 tablet by mouth at bedtime.  02/29/16 02/28/17 Yes Daymon Larsen, MD  chlorthalidone (HYGROTON) 25 MG tablet Take 1 tablet (25 mg total) by mouth daily. 02/04/16  Yes Arlis Porta., MD  DULoxetine (CYMBALTA) 30 MG capsule Take 1 capsule (30 mg total) by mouth daily. 02/04/16  Yes Arlis Porta., MD  gabapentin (NEURONTIN) 300 MG capsule Take 1 tablet twice a day and 2 at bedtime. Patient taking differently: Take 300-600 mg by mouth 3 (three) times daily. 300 mg every morning, 300 mg every afternoon, and 600 mg at bedtime 02/04/16  Yes Arlis Porta., MD  ibuprofen (ADVIL,MOTRIN) 200 MG tablet Take 600 mg by mouth daily as needed for moderate pain.   Yes Historical Provider, MD    lidocaine (LIDODERM) 5 % Place 2 patches onto the skin daily. 1 patch to each shoulder   Yes Historical Provider, MD  lisinopril (PRINIVIL,ZESTRIL) 30 MG tablet Take 1 tablet (30 mg total) by mouth daily. 02/04/16  Yes Arlis Porta., MD  Multiple Vitamin (MULTIVITAMIN) tablet Take 1 tablet by mouth every 3 (three) days.   Yes Historical Provider, MD  traMADol (ULTRAM) 50 MG tablet Take 50 mg by mouth as needed.   Yes Historical Provider, MD  Na Sulfate-K Sulfate-Mg Sulf (SUPREP BOWEL PREP KIT) 17.5-3.13-1.6 GM/180ML SOLN Take 1 kit by mouth as directed. 03/24/16   Lucilla Lame, MD  potassium chloride (K-DUR) 10 MEQ tablet Take 2 tablets (20 mEq total) by mouth daily. 03/25/16   Amy Overton Mam, NP    Allergies as of 03/17/2016 - Review Complete 03/17/2016  Allergen Reaction Noted  . Shellfish allergy Anaphylaxis 03/30/2015  . Shellfish-derived products Anaphylaxis 03/30/2015    Family History  Problem Relation Age of Onset  . Family history unknown: Yes    Social History   Social History  . Marital Status: Married    Spouse Name: N/A  . Number of Children: N/A  . Years of Education: N/A   Occupational History  . Not on file.   Social History Main Topics  . Smoking status: Former Research scientist (life sciences)  . Smokeless tobacco:  Never Used     Comment: quit 18 yrs ago  . Alcohol Use: 2.4 oz/week    0 Standard drinks or equivalent, 4 Shots of liquor per week  . Drug Use: No  . Sexual Activity: Not on file   Other Topics Concern  . Not on file   Social History Narrative    Review of Systems: See HPI, otherwise negative ROS  Physical Exam: Ht 5' 10"  (1.778 m)  Wt 206 lb (93.441 kg)  BMI 29.56 kg/m2 General:   Alert,  pleasant and cooperative in NAD Head:  Normocephalic and atraumatic. Neck:  Supple; no masses or thyromegaly. Lungs:  Clear throughout to auscultation.    Heart:  Regular rate and rhythm. Abdomen:  Soft, nontender and nondistended. Normal bowel sounds, without  guarding, and without rebound.   Neurologic:  Alert and  oriented x4;  grossly normal neurologically.  Impression/Plan: Stephen Ray is now here to undergo a screening colonoscopy.  Risks, benefits, and alternatives regarding colonoscopy have been reviewed with the patient.  Questions have been answered.  All parties agreeable.

## 2016-03-31 NOTE — Op Note (Signed)
[facility] Gastroenterology Patient Name: Stephen Ray Procedure Date: 03/31/2016 9:16 AM MRN: [id number] Account #: [id number] Date of Birth: 23-Aug-1966 Admit Type: Outpatient Age: 50 Room: [facility] OR ROOM 01 Gender: Male Note Status: Finalized Procedure:            Colonoscopy Indications:          Screening for colorectal malignant neoplasm Providers:            Lucilla Lame, MD Referring MD:         Arlis Porta, MD (Referring MD) Medicines:            Propofol per Anesthesia Complications:        No immediate complications. Procedure:            Pre-Anesthesia Assessment:                       - Prior to the procedure, a History and Physical was                        performed, and patient medications and allergies were                        reviewed. The patient's tolerance of previous                        anesthesia was also reviewed. The risks and benefits of                        the procedure and the sedation options and risks were                        discussed with the patient. All questions were                        answered, and informed consent was obtained. Prior                        Anticoagulants: The patient has taken no previous                        anticoagulant or antiplatelet agents. ASA Grade                        Assessment: II - A patient with mild systemic disease.                        After reviewing the risks and benefits, the patient was                        deemed in satisfactory condition to undergo the                        procedure.                       After obtaining informed consent, the colonoscope was                        passed under direct vision. Throughout the procedure,  the patient's blood pressure, pulse, and oxygen                        saturations were monitored continuously. The Olympus CF                        H180AL colonoscope (S#: [id number]) was introduced  through                        the anus and advanced to the the cecum, identified by                        appendiceal orifice and ileocecal valve. The                        colonoscopy was performed without difficulty. The                        patient tolerated the procedure well. The quality of                        the bowel preparation was excellent. Findings:      The perianal and digital rectal examinations were normal.      A 2 mm polyp was found in the ileocecal valve. The polyp was sessile.       The polyp was removed with a cold biopsy forceps. Resection and       retrieval were complete.      Non-bleeding internal hemorrhoids were found during retroflexion. The       hemorrhoids were Grade II (internal hemorrhoids that prolapse but reduce       spontaneously). Impression:           - One 2 mm polyp at the ileocecal valve, removed with a                        cold biopsy forceps. Resected and retrieved.                       - Non-bleeding internal hemorrhoids. Recommendation:       - Await pathology results.                       - Repeat colonoscopy in 5 years if polyp adenoma and 10                        years if hyperplastic Procedure Code(s):    --- Professional ---                       [phone], Colonoscopy, flexible; with biopsy, single or                        multiple Diagnosis Code(s):    --- Professional ---                       Z12.11, Encounter for screening for malignant neoplasm                        of colon  D12.0, Benign neoplasm of cecum CPT copyright 2016 [employer]. All rights reserved. The codes documented in this report are preliminary and upon coder review may  be revised to meet current compliance requirements. Lucilla Lame, MD 03/31/2016 9:38:59 AM This report has been signed electronically. Number of Addenda: 0 Note Initiated On: 03/31/2016 9:16 AM Scope Withdrawal Time: 0 hours 7 minutes 49 seconds   Total Procedure Duration: 0 hours 10 minutes 31 seconds       [facility]

## 2016-03-31 NOTE — Transfer of Care (Signed)
Immediate Anesthesia Transfer of Care Note  Patient: Stephen Ray  Procedure(s) Performed: Procedure(s): COLONOSCOPY WITH PROPOFOL (N/A) POLYPECTOMY  Patient Location: PACU  Anesthesia Type: MAC  Level of Consciousness: awake, alert  and patient cooperative  Airway and Oxygen Therapy: Patient Spontanous Breathing and Patient connected to supplemental oxygen  Post-op Assessment: Post-op Vital signs reviewed, Patient's Cardiovascular Status Stable, Respiratory Function Stable, Patent Airway and No signs of Nausea or vomiting  Post-op Vital Signs: Reviewed and stable  Complications: No apparent anesthesia complications

## 2016-03-31 NOTE — Anesthesia Postprocedure Evaluation (Signed)
Anesthesia Post Note  Patient: Stephen Ray  Procedure(s) Performed: Procedure(s) (LRB): COLONOSCOPY WITH PROPOFOL (N/A) POLYPECTOMY  Patient location during evaluation: PACU Anesthesia Type: MAC Level of consciousness: awake and alert and oriented Pain management: pain level controlled Vital Signs Assessment: post-procedure vital signs reviewed and stable Respiratory status: spontaneous breathing and nonlabored ventilation Cardiovascular status: stable Postop Assessment: no signs of nausea or vomiting and adequate PO intake Anesthetic complications: no    Estill Batten

## 2016-04-01 ENCOUNTER — Encounter: Payer: Self-pay | Admitting: Gastroenterology

## 2016-04-04 ENCOUNTER — Encounter: Payer: Self-pay | Admitting: Gastroenterology

## 2016-04-05 ENCOUNTER — Encounter: Payer: Self-pay | Admitting: Gastroenterology

## 2016-05-03 ENCOUNTER — Ambulatory Visit: Payer: BC Managed Care – PPO | Admitting: Family Medicine

## 2016-05-05 ENCOUNTER — Encounter: Payer: Self-pay | Admitting: Family Medicine

## 2016-05-05 ENCOUNTER — Ambulatory Visit: Payer: BC Managed Care – PPO | Admitting: Family Medicine

## 2016-05-05 ENCOUNTER — Ambulatory Visit (INDEPENDENT_AMBULATORY_CARE_PROVIDER_SITE_OTHER): Payer: BC Managed Care – PPO | Admitting: Family Medicine

## 2016-05-05 VITALS — BP 142/98 | HR 75 | Temp 98.7°F | Resp 16 | Ht 70.0 in | Wt 207.0 lb

## 2016-05-05 DIAGNOSIS — E876 Hypokalemia: Secondary | ICD-10-CM | POA: Diagnosis not present

## 2016-05-05 DIAGNOSIS — I10 Essential (primary) hypertension: Secondary | ICD-10-CM

## 2016-05-05 LAB — BASIC METABOLIC PANEL WITH GFR
BUN: 14 mg/dL (ref 7–25)
CO2: 25 mmol/L (ref 20–31)
Calcium: 9.7 mg/dL (ref 8.6–10.3)
Chloride: 104 mmol/L (ref 98–110)
Creat: 1.12 mg/dL (ref 0.70–1.33)
GFR, EST AFRICAN AMERICAN: 88 mL/min (ref >=60)
GFR, EST NON AFRICAN AMERICAN: 76 mL/min (ref >=60)
GLUCOSE: 93 mg/dL (ref 65–99)
POTASSIUM: 3.4 mmol/L — AB (ref 3.5–5.3)
Sodium: 140 mmol/L (ref 135–146)

## 2016-05-05 MED ORDER — LISINOPRIL 40 MG PO TABS: 40.0000 mg | ORAL_TABLET | Freq: Every day | ORAL | 11 refills | Status: DC

## 2016-05-05 NOTE — Patient Instructions (Signed)
Let's go up to 40mg  on your lisinopril to control your blood pressure.   Your goal blood pressure is 140/90. Work on low salt/sodium diet - goal <1.5gm (1,500mg ) per day. Eat a diet high in fruits/vegetables and whole grains.  Look into mediterranean and DASH diet. Goal activity is 192min/wk of moderate intensity exercise.  This can be split into 30 minute chunks.  If you are not at this level, you can start with smaller 10-15 min increments and slowly build up activity. Look at [email or web address] for more resources

## 2016-05-05 NOTE — Progress Notes (Signed)
Subjective:    Patient ID: Stephen Ray, male    DOB: [birth year], 50 y.o.   MRN: [id number]  HPI: Stephen Ray is a 50 y.o. male presenting on 05/05/2016 for Hypertension   HPI  Pt presents for recheck of blood pressure. Checking at home Avg 130-140/90 at home. Taking chlorthalidone 23m once daily and Lisinopril 320m K was low last labs. Pt completed potassium. Will recheck today.  RLS symptoms are improved since stopping the pain medication. Taking sinemet PRN.     Past Medical History:  Diagnosis Date  . Anxiety   . Arthritis    shoulders (before replacements)  . Asthma    as child  . Hypertension   . Kidney stones   . Motion sickness    deep sea fishing  . Shoulder pain    left and right    Current Outpatient Prescriptions on File Prior to Visit  Medication Sig  . carbidopa-levodopa (SINEMET) 25-100 MG tablet Take 1 tablet by mouth 3 (three) times daily as needed. (Patient taking differently: Take 1 tablet by mouth at bedtime. )  . chlorthalidone (HYGROTON) 25 MG tablet Take 1 tablet (25 mg total) by mouth daily.  . DULoxetine (CYMBALTA) 30 MG capsule Take 1 capsule (30 mg total) by mouth daily.  . Marland Kitchenabapentin (NEURONTIN) 300 MG capsule Take 1 tablet twice a day and 2 at bedtime. (Patient taking differently: Take 300-600 mg by mouth 3 (three) times daily. 300 mg every morning, 300 mg every afternoon, and 600 mg at bedtime)  . ibuprofen (ADVIL,MOTRIN) 200 MG tablet Take 600 mg by mouth daily as needed for moderate pain.  . Marland Kitchenidocaine (LIDODERM) 5 % Place 2 patches onto the skin daily. 1 patch to each shoulder  . Multiple Vitamin (MULTIVITAMIN) tablet Take 1 tablet by mouth every 3 (three) days.  . potassium chloride (K-DUR) 10 MEQ tablet Take 2 tablets (20 mEq total) by mouth daily.   No current facility-administered medications on file prior to visit.     Review of Systems  Constitutional: Negative for chills and fever.  HENT: Negative.   Respiratory: Negative  for chest tightness, shortness of breath and wheezing.   Cardiovascular: Negative for chest pain, palpitations and leg swelling.  Gastrointestinal: Negative for abdominal pain, nausea and vomiting.  Endocrine: Negative.   Genitourinary: Negative for discharge, dysuria, penile pain, testicular pain and urgency.  Musculoskeletal: Negative for arthralgias, back pain and joint swelling.  Skin: Negative.   Neurological: Negative for dizziness, weakness, numbness and headaches.  Psychiatric/Behavioral: Negative for dysphoric mood and sleep disturbance.   Per HPI unless specifically indicated above     Objective:    BP (!) 142/98 (BP Location: Right Arm, Patient Position: Sitting, Cuff Size: Normal)   Pulse 75   Temp 98.7 F (37.1 C) (Oral)   Resp 16   Ht _0  (1.778 m)   Wt 207 lb (93.9 kg)   BMI 29.70 kg/m   Wt Readings from Last 3 Encounters:  05/05/16 207 lb (93.9 kg)  03/31/16 200 lb (90.7 kg)  03/24/16 206 lb (93.4 kg)    Physical Exam  Constitutional: He is oriented to person, place, and time. He appears well-developed and well-nourished. No distress.  HENT:  Head: Normocephalic and atraumatic.  Neck: Neck supple. No thyromegaly present.  Cardiovascular: Normal rate, regular rhythm and normal heart sounds.  Exam reveals no gallop and no friction rub.   No murmur heard. Pulmonary/Chest: Effort normal and breath sounds normal. He has  no wheezes.  Abdominal: Soft. Bowel sounds are normal. He exhibits no distension. There is no tenderness. There is no rebound.  Musculoskeletal: Normal range of motion. He exhibits no edema or tenderness.  Neurological: He is alert and oriented to person, place, and time. He has normal reflexes.  Skin: Skin is warm and dry. No rash noted. No erythema.  Psychiatric: He has a normal mood and affect. His behavior is normal. Thought content normal.   Results for orders placed or performed in visit on 03/24/16  COMPLETE METABOLIC PANEL WITH GFR    Result Value Ref Range   Sodium 139 135 - 146 mmol/L   Potassium 3.2 (L) 3.5 - 5.3 mmol/L   Chloride 101 98 - 110 mmol/L   CO2 28 20 - 31 mmol/L   Glucose, Bld 104 (H) 65 - 99 mg/dL   BUN 11 7 - 25 mg/dL   Creat 0.78 0.70 - 1.33 mg/dL   Total Bilirubin 0.5 0.2 - 1.2 mg/dL   Alkaline Phosphatase 77 40 - 115 U/L   AST 23 10 - 35 U/L   ALT 23 9 - 46 U/L   Total Protein 6.9 6.1 - 8.1 g/dL   Albumin 4.4 3.6 - 5.1 g/dL   Calcium 9.1 8.6 - 10.3 mg/dL   GFR, Est African American >89 >=60 mL/min   GFR, Est Non African American >89 >=60 mL/min  CBC with Differential/Platelet  Result Value Ref Range   WBC 6.0 3.8 - 10.8 K/uL   RBC 4.64 4.20 - 5.80 MIL/uL   Hemoglobin 14.7 13.2 - 17.1 g/dL   HCT 42.1 38.5 - 50.0 %   MCV 90.7 80.0 - 100.0 fL   MCH 31.7 27.0 - 33.0 pg   MCHC 34.9 32.0 - 36.0 g/dL   RDW 13.5 11.0 - 15.0 %   Platelets 256 140 - 400 K/uL   MPV 9.5 7.5 - 12.5 fL   Neutro Abs 3,000 1,500 - 7,800 cells/uL   Lymphs Abs 2,280 850 - 3,900 cells/uL   Monocytes Absolute 480 200 - 950 cells/uL   Eosinophils Absolute 180 15 - 500 cells/uL   Basophils Absolute 60 0 - 200 cells/uL   Neutrophils Relative % 50 %   Lymphocytes Relative 38 %   Monocytes Relative 8 %   Eosinophils Relative 3 %   Basophils Relative 1 %   Smear Review Criteria for review not met   Lipid Profile  Result Value Ref Range   Cholesterol 182 125 - 200 mg/dL   Triglycerides 309 (H) <150 mg/dL   HDL 41 >=40 mg/dL   Total CHOL/HDL Ratio 4.4 <=5.0 Ratio   VLDL 62 (H) <30 mg/dL   LDL Cholesterol 79 <130 mg/dL  Ferritin  Result Value Ref Range   Ferritin 38 20 - 380 ng/mL  Iron Binding Cap (TIBC)  Result Value Ref Range   Iron 92 50 - 180 ug/dL   UIBC 247 125 - 400 ug/dL   TIBC 339 250 - 425 ug/dL   %SAT 27 15 - 60 %      Assessment & Plan:   Problem List Items Addressed This Visit      Cardiovascular and Mediastinum   Essential (primary) hypertension - Primary    BP is above goal. Plan to  increase lisinopril to 6m. Recheck BP. Encouraged low salt diet and exercise.       Relevant Medications   lisinopril (PRINIVIL,ZESTRIL) 40 MG tablet   Other Relevant Orders   BASIC METABOLIC PANEL  WITH GFR    Other Visit Diagnoses    Hypokalemia       Recheck potassium today. Likely 2/2 diuretic and working outside. Pt has been trying to stay hydrated.       Meds ordered this encounter  Medications  . lisinopril (PRINIVIL,ZESTRIL) 40 MG tablet    Sig: Take 1 tablet (40 mg total) by mouth daily.    Dispense:  30 tablet    Refill:  11    Order Specific Question:   Supervising Provider    Answer:   Arlis Porta [phone]      Follow up plan: Return in about 4 weeks (around 06/02/2016), or if symptoms worsen or fail to improve, for HTN. Marland Kitchen

## 2016-05-05 NOTE — Assessment & Plan Note (Signed)
BP is above goal. Plan to increase lisinopril to 40mg . Recheck BP. Encouraged low salt diet and exercise.

## 2016-05-06 ENCOUNTER — Other Ambulatory Visit: Payer: Self-pay | Admitting: Family Medicine

## 2016-05-06 DIAGNOSIS — E876 Hypokalemia: Secondary | ICD-10-CM

## 2016-05-06 MED ORDER — POTASSIUM CHLORIDE ER 10 MEQ PO TBCR: 10.0000 meq | EXTENDED_RELEASE_TABLET | Freq: Every day | ORAL | 1 refills | Status: DC

## 2016-06-02 ENCOUNTER — Encounter: Payer: Self-pay | Admitting: Family Medicine

## 2016-06-02 ENCOUNTER — Ambulatory Visit (INDEPENDENT_AMBULATORY_CARE_PROVIDER_SITE_OTHER): Payer: BC Managed Care – PPO | Admitting: Family Medicine

## 2016-06-02 VITALS — BP 170/100 | HR 63 | Temp 98.5°F | Resp 16 | Ht 70.0 in | Wt 212.2 lb

## 2016-06-02 DIAGNOSIS — I10 Essential (primary) hypertension: Secondary | ICD-10-CM

## 2016-06-02 DIAGNOSIS — R1900 Intra-abdominal and pelvic swelling, mass and lump, unspecified site: Secondary | ICD-10-CM

## 2016-06-02 DIAGNOSIS — Z23 Encounter for immunization: Secondary | ICD-10-CM | POA: Diagnosis not present

## 2016-06-02 DIAGNOSIS — R1013 Epigastric pain: Secondary | ICD-10-CM

## 2016-06-02 LAB — CBC WITH DIFFERENTIAL/PLATELET
BASOS PCT: 1 %
Basophils Absolute: 61 cells/uL (ref 0–200)
Eosinophils Absolute: 183 cells/uL (ref 15–500)
Eosinophils Relative: 3 %
HEMATOCRIT: 42.3 % (ref 38.5–50.0)
HEMOGLOBIN: 14.6 g/dL (ref 13.2–17.1)
LYMPHS ABS: 2623 {cells}/uL (ref 850–3900)
Lymphocytes Relative: 43 %
MCH: 32 pg (ref 27.0–33.0)
MCHC: 34.5 g/dL (ref 32.0–36.0)
MCV: 92.8 fL (ref 80.0–100.0)
MONO ABS: 549 {cells}/uL (ref 200–950)
MPV: 9.6 fL (ref 7.5–12.5)
Monocytes Relative: 9 %
NEUTROS PCT: 44 %
Neutro Abs: 2684 cells/uL (ref 1500–7800)
Platelets: 253 10*3/uL (ref 140–400)
RBC: 4.56 MIL/uL (ref 4.20–5.80)
RDW: 13.1 % (ref 11.0–15.0)
WBC: 6.1 10*3/uL (ref 3.8–10.8)

## 2016-06-02 MED ORDER — AMLODIPINE BESYLATE 5 MG PO TABS: 5.0000 mg | ORAL_TABLET | Freq: Every day | ORAL | 11 refills | Status: DC

## 2016-06-02 NOTE — Progress Notes (Signed)
Subjective:    Patient ID: Stephen Ray, male    DOB: Oct 02, 1965, 50 y.o.   MRN: [id number]  HPI: Stephen Ray is a 50 y.o. male presenting on 06/02/2016 for Hypertension (Follow up ) and Chest Pain (Onset for week and a half , and on his right side . )   HPI  Pt presents for hypertension follow-up and R epigastric pain. Pain present x 1 week. Constant. Tender to touch. Underneath the rib per patient. No shortness of breath. No relation to meals. Dull pain. No chest heaviness. Is improving over time. Has been working more and has very physical job. Dull pain all the time. Worse with palpation of the RUQ.  BP has been running high at home.    Past Medical History:  Diagnosis Date  . Anxiety   . Arthritis    shoulders (before replacements)  . Asthma    as child  . Hypertension   . Kidney stones   . Motion sickness    deep sea fishing  . Shoulder pain    left and right    Current Outpatient Prescriptions on File Prior to Visit  Medication Sig  . carbidopa-levodopa (SINEMET) 25-100 MG tablet Take 1 tablet by mouth 3 (three) times daily as needed.  . DULoxetine (CYMBALTA) 30 MG capsule Take 1 capsule (30 mg total) by mouth daily.  Marland Kitchen gabapentin (NEURONTIN) 300 MG capsule Take 1 tablet twice a day and 2 at bedtime. (Patient taking differently: Take 300-600 mg by mouth 3 (three) times daily. 300 mg every morning, 300 mg every afternoon, and 600 mg at bedtime)  . ibuprofen (ADVIL,MOTRIN) 200 MG tablet Take 600 mg by mouth daily as needed for moderate pain.  Marland Kitchen lidocaine (LIDODERM) 5 % Place 2 patches onto the skin daily. 1 patch to each shoulder  . lisinopril (PRINIVIL,ZESTRIL) 40 MG tablet Take 1 tablet (40 mg total) by mouth daily.  . Multiple Vitamin (MULTIVITAMIN) tablet Take 1 tablet by mouth every 3 (three) days.  . potassium chloride (K-DUR) 10 MEQ tablet Take 1 tablet (10 mEq total) by mouth daily.   No current facility-administered medications on file prior to visit.      Review of Systems  Constitutional: Negative for chills and fever.  HENT: Negative.   Respiratory: Negative for chest tightness, shortness of breath and wheezing.   Cardiovascular: Negative for chest pain, palpitations and leg swelling.  Gastrointestinal: Positive for abdominal pain (epigastric pain). Negative for nausea and vomiting.       Abdominal bulge with crunches.   Endocrine: Negative.   Genitourinary: Negative for discharge, dysuria, penile pain, testicular pain and urgency.  Musculoskeletal: Negative for arthralgias, back pain and joint swelling.  Skin: Negative.   Neurological: Negative for dizziness, weakness, numbness and headaches.  Psychiatric/Behavioral: Negative for dysphoric mood and sleep disturbance.   Per HPI unless specifically indicated above     Objective:    BP (!) 170/100 (BP Location: Right Arm, Patient Position: Sitting, Cuff Size: Large)   Pulse 63   Temp 98.5 F (36.9 C) (Oral)   Resp 16   Ht 5\' 10"  (1.778 m)   Wt 212 lb 3.2 oz (96.3 kg)   BMI 30.45 kg/m   Wt Readings from Last 3 Encounters:  06/02/16 212 lb 3.2 oz (96.3 kg)  05/05/16 207 lb (93.9 kg)  03/31/16 200 lb (90.7 kg)    Physical Exam  Constitutional: He is oriented to person, place, and time. He appears well-developed and well-nourished.  No distress.  HENT:  Head: Normocephalic and atraumatic.  Neck: Neck supple. No thyromegaly present.  Cardiovascular: Normal rate, regular rhythm and normal heart sounds.  Exam reveals no gallop and no friction rub.   No murmur heard. Pulmonary/Chest: Effort normal and breath sounds normal. He has no wheezes.  Abdominal: Soft. Bowel sounds are normal. He exhibits no distension. There is tenderness in the right upper quadrant. There is positive Murphy's sign. There is no rebound.  Bulge with exertion of the abdomen. Widening of the diastasis to 2.5 fingerbreadth's.   Musculoskeletal: Normal range of motion. He exhibits no edema or tenderness.   Neurological: He is alert and oriented to person, place, and time. He has normal reflexes.  Skin: Skin is warm and dry. No rash noted. No erythema.  Psychiatric: He has a normal mood and affect. His behavior is normal. Thought content normal.   Results for orders placed or performed in visit on [id number]  BASIC METABOLIC PANEL WITH GFR  Result Value Ref Range   Sodium 140 135 - 146 mmol/L   Potassium 3.4 (L) 3.5 - 5.3 mmol/L   Chloride 104 98 - 110 mmol/L   CO2 25 20 - 31 mmol/L   Glucose, Bld 93 65 - 99 mg/dL   BUN 14 7 - 25 mg/dL   Creat 1.12 0.70 - 1.33 mg/dL   Calcium 9.7 8.6 - 10.3 mg/dL   GFR, Est African American 88 >=60 mL/min   GFR, Est Non African American 76 >=60 mL/min      Assessment & Plan:   Problem List Items Addressed This Visit      Cardiovascular and Mediastinum   Essential (primary) hypertension    STOP chlorthalidone. Start amlodipine for better BP control without potassium issues. Continue lisinopril. Recheck 4 weeks.       Relevant Medications   amLODipine (NORVASC) 5 MG tablet    Other Visit Diagnoses    Epigastric abdominal pain    -  Primary   + Murphy sign on exam. [place] to r/o gallbladder disease. Possible MSK. Check CMP, CBC, amylase and lipase. Alarm symptoms reviewed.    Relevant Orders   US Abdomen Complete   COMPLETE METABOLIC PANEL WITH GFR   Amylase   Lipase   CBC with Differential   EKG 12-Lead   Need for influenza vaccination       Relevant Orders   Flu Vaccine QUAD 36+ mos PF IM (Fluarix & Fluzone Quad PF) (Completed)   Abdominal wall bulge       Likely diastasis recti but will r/o hernia with ultrasound given pt history. To ER with severe abdominal pain, nausea, vomiting, fever.    Relevant Orders   US Abdomen Complete      Meds ordered this encounter  Medications  . amLODipine (NORVASC) 5 MG tablet    Sig: Take 1 tablet (5 mg total) by mouth daily.    Dispense:  30 tablet    Refill:  11    Order Specific Question:    Supervising Provider    Answer:   Arlis Porta [phone]      Follow up plan: Return in about 4 weeks (around 06/30/2016) for HTN.Marland Kitchen

## 2016-06-02 NOTE — Assessment & Plan Note (Signed)
STOP chlorthalidone. Start amlodipine for better BP control without potassium issues. Continue lisinopril. Recheck 4 weeks.

## 2016-06-02 NOTE — Patient Instructions (Signed)
Blood pressure: We will switch to amlodipine 5 mg once daily. Stop the chlorthalidone. Continue the lisinopril.   Your goal blood pressure is 140/90. Work on low salt/sodium diet - goal <1.5gm (1,500mg ) per day. Eat a diet high in fruits/vegetables and whole grains.  Look into mediterranean and DASH diet. Goal activity is 196min/wk of moderate intensity exercise.  This can be split into 30 minute chunks.  If you are not at this level, you can start with smaller 10-15 min increments and slowly build up activity. Look at [email or web address] for more resources   We will call the schedule an ultrasound of your abdomen to rule out gall bladder disease and possible hernia.

## 2016-06-03 LAB — COMPLETE METABOLIC PANEL WITH GFR
ALBUMIN: 4.3 g/dL (ref 3.6–5.1)
ALK PHOS: 68 U/L (ref 40–115)
ALT: 26 U/L (ref 9–46)
AST: 22 U/L (ref 10–35)
BILIRUBIN TOTAL: 0.5 mg/dL (ref 0.2–1.2)
BUN: 15 mg/dL (ref 7–25)
CO2: 31 mmol/L (ref 20–31)
Calcium: 9.8 mg/dL (ref 8.6–10.3)
Chloride: 95 mmol/L — ABNORMAL LOW (ref 98–110)
Creat: 0.95 mg/dL (ref 0.70–1.33)
GLUCOSE: 95 mg/dL (ref 65–99)
Potassium: 3.5 mmol/L (ref 3.5–5.3)
SODIUM: 140 mmol/L (ref 135–146)
TOTAL PROTEIN: 6.9 g/dL (ref 6.1–8.1)

## 2016-06-03 LAB — LIPASE: Lipase: 43 U/L (ref 7–60)

## 2016-06-03 LAB — AMYLASE: Amylase: 40 U/L (ref 0–105)

## 2016-06-08 ENCOUNTER — Telehealth: Payer: Self-pay | Admitting: Family Medicine

## 2016-06-08 DIAGNOSIS — R1900 Intra-abdominal and pelvic swelling, mass and lump, unspecified site: Secondary | ICD-10-CM

## 2016-06-08 NOTE — Telephone Encounter (Signed)
Amber   @ [place] have question about  Pt order . Amber call back # is  [phone]

## 2016-06-08 NOTE — Telephone Encounter (Signed)
Spoke to [employer] she wants  another order placed too for abdominal limited since abdominal complete looks only organ in abdominal but can't rule out if you are looking for hernia too. Amy,  you can call Amber if you have any question.

## 2016-06-08 NOTE — Telephone Encounter (Signed)
Spoke with [employer]. Order placed and released.

## 2016-06-09 ENCOUNTER — Ambulatory Visit
Admission: RE | Admit: 2016-06-09 | Discharge: 2016-06-09 | Disposition: A | Discharge status: Home / Self Care | Payer: BC Managed Care – PPO | Source: Ambulatory Visit | Attending: Family Medicine | Admitting: Family Medicine

## 2016-06-09 DIAGNOSIS — R1900 Intra-abdominal and pelvic swelling, mass and lump, unspecified site: Secondary | ICD-10-CM | POA: Diagnosis present

## 2016-06-09 DIAGNOSIS — R1013 Epigastric pain: Secondary | ICD-10-CM | POA: Diagnosis not present

## 2016-06-21 IMAGING — CR DG SHOULDER 1V*L*
1 series · 3 of 3 positions shown · non-contrast
Comparison: Shoulder MRI 03/16/2015.

CLINICAL DATA: Patient status post left shoulder hemiarthroplasty.

EXAM:
LEFT SHOULDER - 1 VIEW

[Series 1: ap · 0.17mm/px · 3 of 3 slices shown]
[im 1/3]
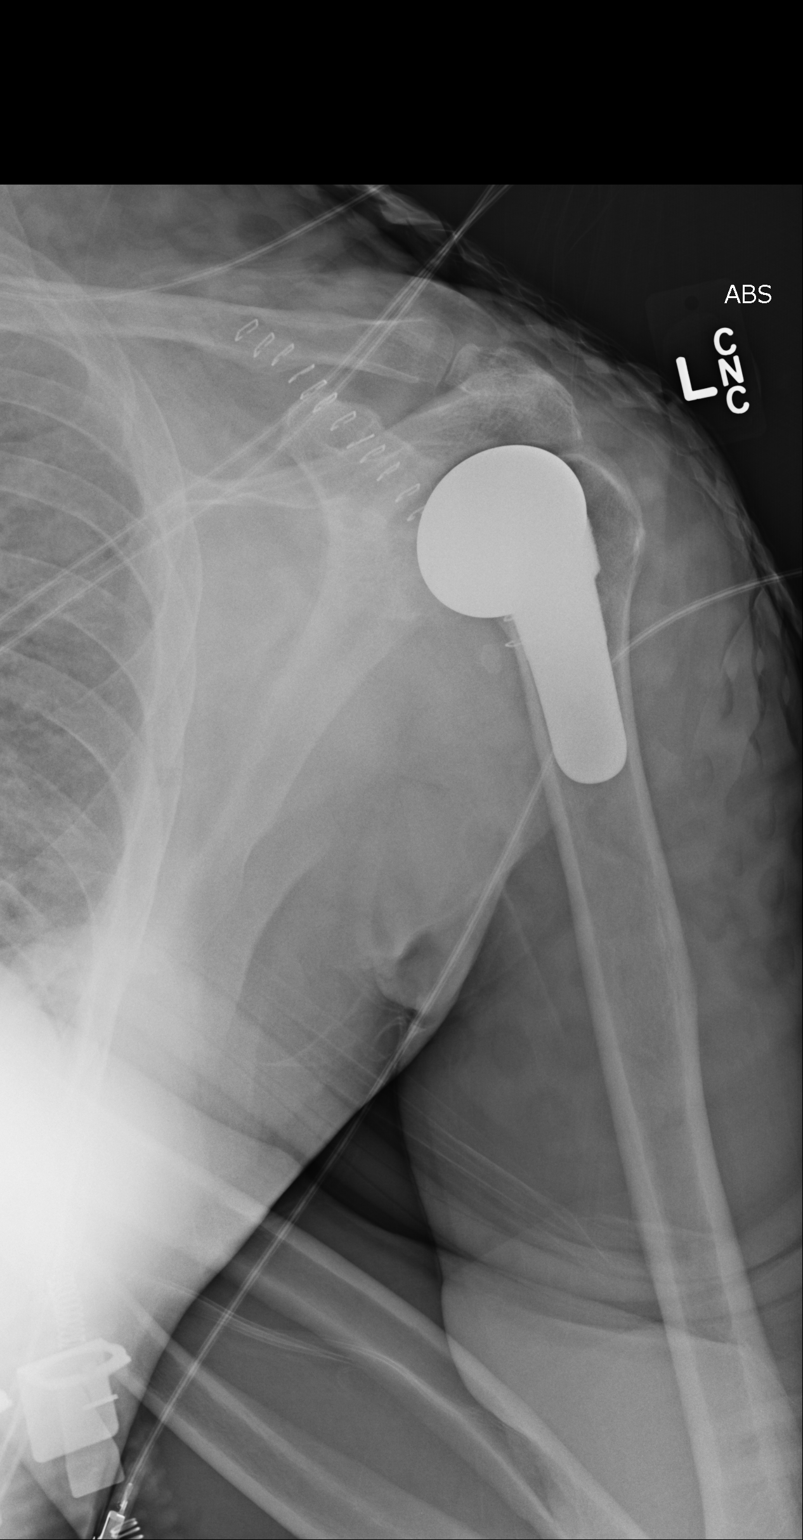
[im 2/3]
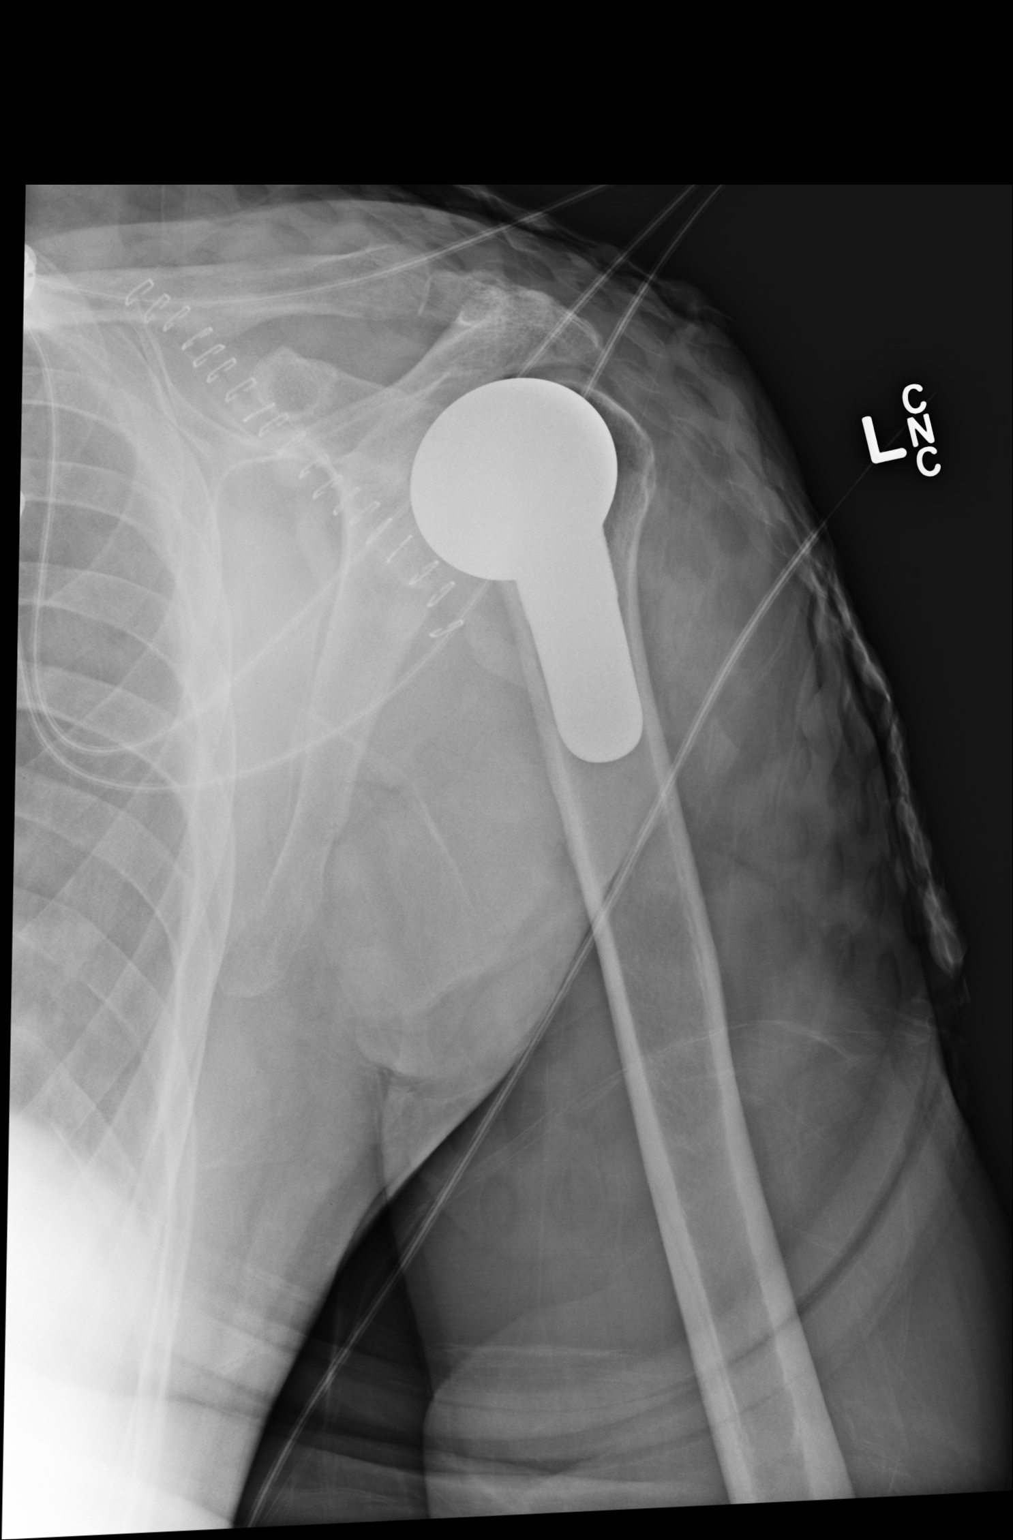
[im 3/3]
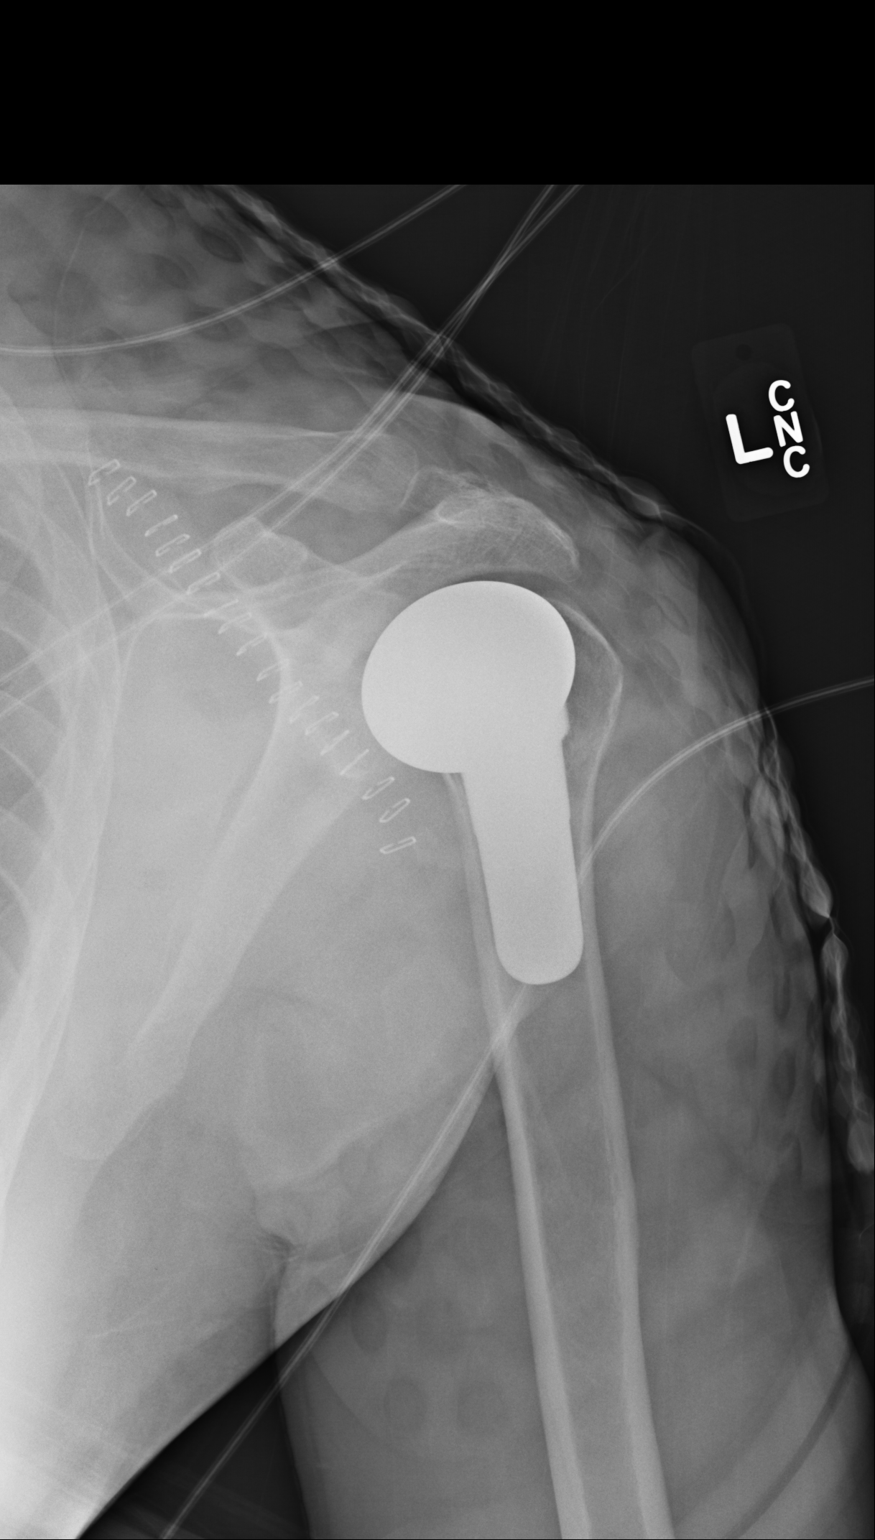

[3 of 3 positions shown; findings below may reference images not displayed]

FINDINGS: Three images were submitted demonstrating left shoulder
arthroplasty. Overlying surgical staple line. Hardware is intact. No
evidence for acute osseous abnormality.
IMPRESSION: Postprocedural changes compatible with left shoulder arthroplasty.

## 2016-07-04 ENCOUNTER — Ambulatory Visit (INDEPENDENT_AMBULATORY_CARE_PROVIDER_SITE_OTHER): Payer: BC Managed Care – PPO | Admitting: Family Medicine

## 2016-07-04 ENCOUNTER — Encounter: Payer: Self-pay | Admitting: Family Medicine

## 2016-07-04 VITALS — BP 140/80 | HR 77 | Temp 98.1°F | Resp 16 | Ht 70.0 in | Wt 215.0 lb

## 2016-07-04 DIAGNOSIS — T148XXA Other injury of unspecified body region, initial encounter: Secondary | ICD-10-CM | POA: Diagnosis not present

## 2016-07-04 DIAGNOSIS — E876 Hypokalemia: Secondary | ICD-10-CM

## 2016-07-04 DIAGNOSIS — I10 Essential (primary) hypertension: Secondary | ICD-10-CM

## 2016-07-04 LAB — BASIC METABOLIC PANEL WITH GFR
BUN: 14 mg/dL (ref 7–25)
CALCIUM: 9.6 mg/dL (ref 8.6–10.3)
CO2: 27 mmol/L (ref 20–31)
CREATININE: 0.86 mg/dL (ref 0.70–1.33)
Chloride: 103 mmol/L (ref 98–110)
GFR, Est Non African American: >89 mL/min (ref >=60)
GLUCOSE: 102 mg/dL — AB (ref 65–99)
Potassium: 3.7 mmol/L (ref 3.5–5.3)
Sodium: 140 mmol/L (ref 135–146)

## 2016-07-04 NOTE — Progress Notes (Signed)
Name: Stephen Ray   MRN: [id number]    DOB: 23-Mar-1966   Date:07/04/2016       Progress Note  Subjective  Chief Complaint  Chief Complaint  Patient presents with  . Follow-up    u/s    HPI Here for f/u of abd pain .  [place] did not show abd wall hernia.  Hios pain resolves as long as not lifting too much weight.    He forgot his BP meds until today (off them x 2 days).  No problem-specific Assessment & Plan notes found for this encounter.   Past Medical History:  Diagnosis Date  . Anxiety   . Arthritis    shoulders (before replacements)  . Asthma    as child  . Hypertension   . Kidney stones   . Motion sickness    deep sea fishing  . Shoulder pain    left and right    Past Surgical History:  Procedure Laterality Date  . CERVICAL FUSION  2001  . COLONOSCOPY WITH PROPOFOL N/A 03/31/2016   Procedure: COLONOSCOPY WITH PROPOFOL;  Surgeon: Lucilla Lame, MD;  Location: [place];  Service: Endoscopy;  Laterality: N/A;  . HERNIA REPAIR  2005  . LIPOMA EXCISION Right 2012   under arm  . LITHOTRIPSY  2002  . POLYPECTOMY  03/31/2016   Procedure: POLYPECTOMY;  Surgeon: Lucilla Lame, MD;  Location: [place];  Service: Endoscopy;;  . SHOULDER HEMI-ARTHROPLASTY Left 08/20/2015   Procedure: SHOULDER HEMI-ARTHROPLASTY;  Surgeon: Corky Mull, MD;  Location: [facility];  Service: Orthopedics;  Laterality: Left;  . SHOULDER SURGERY Right 12/25/2014  . TOTAL SHOULDER ARTHROPLASTY Right     Family History  Problem Relation Age of Onset  . Family history unknown: Yes    Social History   Social History  . Marital status: Married    Spouse name: N/A  . Number of children: N/A  . Years of education: N/A   Occupational History  . Not on file.   Social History Main Topics  . Smoking status: Former Research scientist (life sciences)  . Smokeless tobacco: Never Used     Comment: quit 18 yrs ago  . Alcohol use 2.4 oz/week    4 Shots of liquor per week     Comment: Occasionally   . Drug  use: No  . Sexual activity: Not on file   Other Topics Concern  . Not on file   Social History Narrative  . No narrative on file     Current Outpatient Prescriptions:  .  amLODipine (NORVASC) 5 MG tablet, Take 1 tablet (5 mg total) by mouth daily., Disp: 30 tablet, Rfl: 11 .  DULoxetine (CYMBALTA) 30 MG capsule, Take 1 capsule (30 mg total) by mouth daily., Disp: 30 capsule, Rfl: 6 .  gabapentin (NEURONTIN) 300 MG capsule, Take 1 tablet twice a day and 2 at bedtime. (Patient taking differently: Take 300-600 mg by mouth 3 (three) times daily as needed. 300 mg every morning, 300 mg every afternoon, and 600 mg at bedtime), Disp: 120 capsule, Rfl: 12 .  ibuprofen (ADVIL,MOTRIN) 200 MG tablet, Take 600 mg by mouth daily as needed for moderate pain., Disp: , Rfl:  .  lidocaine (LIDODERM) 5 %, Place 2 patches onto the skin daily. 1 patch to each shoulder, Disp: , Rfl:  .  Multiple Vitamin (MULTIVITAMIN) tablet, Take 1 tablet by mouth every 3 (three) days., Disp: , Rfl:  .  carbidopa-levodopa (SINEMET) 25-100 MG tablet, Take 1 tablet by  mouth 3 (three) times daily as needed. (Patient not taking: Reported on 07/04/2016), Disp: 60 tablet, Rfl: 2 .  potassium chloride (K-DUR) 10 MEQ tablet, Take 1 tablet (10 mEq total) by mouth daily. (Patient not taking: Reported on 07/04/2016), Disp: 30 tablet, Rfl: 1  Allergies  Allergen Reactions  . Shellfish Allergy Anaphylaxis  . Shellfish-Derived Products Anaphylaxis     Review of Systems  Constitutional: Negative for chills, fever, malaise/fatigue and weight loss.  HENT: Negative for hearing loss.   Eyes: Negative for blurred vision and double vision.  Respiratory: Negative for cough, hemoptysis and wheezing.   Cardiovascular: Negative for chest pain, palpitations and leg swelling.  Gastrointestinal: Negative for abdominal pain, blood in stool, heartburn and nausea.  Genitourinary: Negative for dysuria, frequency and urgency.  Musculoskeletal:  Negative for joint pain and myalgias.  Skin: Negative for rash.  Neurological: Negative for dizziness, tremors, weakness and headaches.      Objective  Vitals:   07/04/16 1317 07/04/16 1341  BP: (!) 146/91 140/80  Pulse: 77   Resp: 16   Temp: 98.1 F (36.7 C)   TempSrc: Oral   Weight: 215 lb (97.5 kg)   Height: 5' 10"  (1.778 m)     Physical Exam  Constitutional: He is oriented to person, place, and time and well-developed, well-nourished, and in no distress. No distress.  HENT:  Head: Normocephalic and atraumatic.  Neck: Normal range of motion. Neck supple. Carotid bruit is not present. No thyromegaly present.  Cardiovascular: Normal rate and regular rhythm.  Exam reveals no gallop and no friction rub.   No murmur heard. Pulmonary/Chest: Effort normal and breath sounds normal. No respiratory distress. He has no wheezes. He has no rales.  Abdominal: Soft. He exhibits no distension and no mass. There is no tenderness.  Musculoskeletal: He exhibits no edema.  Lymphadenopathy:    He has no cervical adenopathy.  Neurological: He is alert and oriented to person, place, and time.  Vitals reviewed.      Recent Results (from the past 2160 hour(s))  BASIC METABOLIC PANEL WITH GFR     Status: Abnormal   Collection Time: 05/05/16  3:22 PM  Result Value Ref Range   Sodium 140 135 - 146 mmol/L   Potassium 3.4 (L) 3.5 - 5.3 mmol/L   Chloride 104 98 - 110 mmol/L   CO2 25 20 - 31 mmol/L   Glucose, Bld 93 65 - 99 mg/dL   BUN 14 7 - 25 mg/dL   Creat 1.12 0.70 - 1.33 mg/dL    Comment:   For patients > or = 50 years of age: The upper reference limit for Creatinine is approximately 13% higher for people identified as African-American.      Calcium 9.7 8.6 - 10.3 mg/dL   GFR, Est African American 88 >=60 mL/min   GFR, Est Non African American 76 >=60 mL/min  COMPLETE METABOLIC PANEL WITH GFR     Status: Abnormal   Collection Time: 06/02/16  3:38 PM  Result Value Ref Range    Sodium 140 135 - 146 mmol/L   Potassium 3.5 3.5 - 5.3 mmol/L   Chloride 95 (L) 98 - 110 mmol/L   CO2 31 20 - 31 mmol/L   Glucose, Bld 95 65 - 99 mg/dL   BUN 15 7 - 25 mg/dL   Creat 0.95 0.70 - 1.33 mg/dL    Comment:   For patients > or = 50 years of age: The upper reference limit for Creatinine is  approximately 13% higher for people identified as African-American.      Total Bilirubin 0.5 0.2 - 1.2 mg/dL   Alkaline Phosphatase 68 40 - 115 U/L   AST 22 10 - 35 U/L   ALT 26 9 - 46 U/L   Total Protein 6.9 6.1 - 8.1 g/dL   Albumin 4.3 3.6 - 5.1 g/dL   Calcium 9.8 8.6 - 10.3 mg/dL   GFR, Est African American >89 >=60 mL/min   GFR, Est Non African American >89 >=60 mL/min  Amylase     Status: None   Collection Time: 06/02/16  3:38 PM  Result Value Ref Range   Amylase 40 0 - 105 U/L  Lipase     Status: None   Collection Time: 06/02/16  3:38 PM  Result Value Ref Range   Lipase 43 7 - 60 U/L  CBC with Differential     Status: None   Collection Time: 06/02/16  3:38 PM  Result Value Ref Range   WBC 6.1 3.8 - 10.8 K/uL   RBC 4.56 4.20 - 5.80 MIL/uL   Hemoglobin 14.6 13.2 - 17.1 g/dL   HCT 42.3 38.5 - 50.0 %   MCV 92.8 80.0 - 100.0 fL   MCH 32.0 27.0 - 33.0 pg   MCHC 34.5 32.0 - 36.0 g/dL   RDW 13.1 11.0 - 15.0 %   Platelets 253 140 - 400 K/uL   MPV 9.6 7.5 - 12.5 fL   Neutro Abs 2,684 1,500 - 7,800 cells/uL   Lymphs Abs 2,623 850 - 3,900 cells/uL   Monocytes Absolute 549 200 - 950 cells/uL   Eosinophils Absolute 183 15 - 500 cells/uL   Basophils Absolute 61 0 - 200 cells/uL   Neutrophils Relative % 44 %   Lymphocytes Relative 43 %   Monocytes Relative 9 %   Eosinophils Relative 3 %   Basophils Relative 1 %   Smear Review Criteria for review not met      Assessment & Plan  Problem List Items Addressed This Visit      Cardiovascular and Mediastinum   Essential (primary) hypertension - Primary     Musculoskeletal and Integument   Muscle strain     Other    Hypokalemia   Relevant Orders   BASIC METABOLIC PANEL WITH GFR    Other Visit Diagnoses   None.    1. Essential (primary) hypertension Cont Amlodipine  2. Hypokalemia  - BASIC METABOLIC PANEL WITH GFR  3. Muscle strain Improving No orders of the defined types were placed in this encounter.

## 2016-09-03 ENCOUNTER — Other Ambulatory Visit: Payer: Self-pay | Admitting: Family Medicine

## 2016-09-03 DIAGNOSIS — F419 Anxiety disorder, unspecified: Secondary | ICD-10-CM

## 2016-10-10 ENCOUNTER — Encounter: Payer: Self-pay | Admitting: *Deleted

## 2016-10-10 ENCOUNTER — Encounter: Payer: Self-pay | Admitting: Family Medicine

## 2016-10-10 ENCOUNTER — Ambulatory Visit (INDEPENDENT_AMBULATORY_CARE_PROVIDER_SITE_OTHER): Payer: BC Managed Care – PPO | Admitting: Family Medicine

## 2016-10-10 VITALS — BP 135/85 | HR 79 | Temp 98.0°F | Resp 16 | Ht 70.0 in | Wt 213.0 lb

## 2016-10-10 DIAGNOSIS — I10 Essential (primary) hypertension: Secondary | ICD-10-CM

## 2016-10-10 DIAGNOSIS — R5383 Other fatigue: Secondary | ICD-10-CM | POA: Diagnosis not present

## 2016-10-10 DIAGNOSIS — B349 Viral infection, unspecified: Secondary | ICD-10-CM

## 2016-10-10 LAB — POCT INFLUENZA A/B
INFLUENZA B, POC: NEGATIVE
Influenza A, POC: NEGATIVE

## 2016-10-10 NOTE — Progress Notes (Signed)
Name: Stephen Ray   MRN: [id number]    DOB: [birth year]   Date:10/10/2016       Progress Note  Subjective  Chief Complaint  No chief complaint on file.   HPI Here for f/u of BP, but he doesn't feel well today.  Hard to explain.  For 2 days has been very tired, no achy or fever.  Some pnd and throaty cough.   He did get a flu shot this year.  Some HA yesterday.  No GI sx.  Some mild SOB with talking.  No problem-specific Assessment & Plan notes found for this encounter.   Past Medical History:  Diagnosis Date  . Anxiety   . Arthritis    shoulders (before replacements)  . Asthma    as child  . Hypertension   . Kidney stones   . Motion sickness    deep sea fishing  . Shoulder pain    left and right    Past Surgical History:  Procedure Laterality Date  . CERVICAL FUSION  2001  . COLONOSCOPY WITH PROPOFOL N/A 03/31/2016   Procedure: COLONOSCOPY WITH PROPOFOL;  Surgeon: Lucilla Lame, MD;  Location: [place];  Service: Endoscopy;  Laterality: N/A;  . HERNIA REPAIR  2005  . LIPOMA EXCISION Right 2012   under arm  . LITHOTRIPSY  2002  . POLYPECTOMY  03/31/2016   Procedure: POLYPECTOMY;  Surgeon: Lucilla Lame, MD;  Location: [place];  Service: Endoscopy;;  . SHOULDER HEMI-ARTHROPLASTY Left 08/20/2015   Procedure: SHOULDER HEMI-ARTHROPLASTY;  Surgeon: Corky Mull, MD;  Location: [facility];  Service: Orthopedics;  Laterality: Left;  . SHOULDER SURGERY Right 12/25/2014  . TOTAL SHOULDER ARTHROPLASTY Right     Family History  Problem Relation Age of Onset  . Family history unknown: Yes    Social History   Social History  . Marital status: Married    Spouse name: N/A  . Number of children: N/A  . Years of education: N/A   Occupational History  . Not on file.   Social History Main Topics  . Smoking status: Former Research scientist (life sciences)  . Smokeless tobacco: Never Used     Comment: quit 18 yrs ago  . Alcohol use 2.4 oz/week    4 Shots of liquor per week   Comment: Occasionally   . Drug use: No  . Sexual activity: Not on file   Other Topics Concern  . Not on file   Social History Narrative  . No narrative on file     Current Outpatient Prescriptions:  .  amLODipine (NORVASC) 5 MG tablet, Take 1 tablet (5 mg total) by mouth daily., Disp: 30 tablet, Rfl: 11 .  carbidopa-levodopa (SINEMET) 25-100 MG tablet, Take 1 tablet by mouth 3 (three) times daily as needed., Disp: 60 tablet, Rfl: 2 .  DULoxetine (CYMBALTA) 30 MG capsule, TAKE ONE CAPSULE BY MOUTH ONCE DAILY *NEEDS  APPOINTMENT  TO  CONTINUE  TO  GET  THIS*, Disp: 30 capsule, Rfl: 6 .  gabapentin (NEURONTIN) 300 MG capsule, Take 1 tablet twice a day and 2 at bedtime. (Patient taking differently: Take 300-600 mg by mouth 3 (three) times daily as needed. 300 mg every morning, 300 mg every afternoon, and 600 mg at bedtime), Disp: 120 capsule, Rfl: 12 .  ibuprofen (ADVIL,MOTRIN) 200 MG tablet, Take 600 mg by mouth daily as needed for moderate pain., Disp: , Rfl:  .  lidocaine (LIDODERM) 5 %, Place 2 patches onto the skin daily. 1 patch  to each shoulder, Disp: , Rfl:   Allergies  Allergen Reactions  . Shellfish Allergy Anaphylaxis  . Shellfish-Derived Products Anaphylaxis     Review of Systems  Constitutional: Positive for malaise/fatigue. Negative for chills, fever and weight loss.  HENT: Positive for congestion (post nasal drip). Negative for hearing loss, sore throat and tinnitus.   Eyes: Negative for blurred vision and double vision.  Respiratory: Negative for cough, shortness of breath and wheezing.   Cardiovascular: Negative for chest pain, palpitations and leg swelling.  Gastrointestinal: Negative for abdominal pain, blood in stool and heartburn.  Genitourinary: Negative for dysuria, frequency and urgency.  Musculoskeletal: Positive for myalgias. Negative for joint pain.  Skin: Negative for rash.  Neurological: Positive for weakness and headaches. Negative for dizziness,  tingling and tremors.      Objective  Vitals:   10/10/16 1324 10/10/16 1347  BP: (!) 131/96 135/85  Pulse: 79   Resp: 16   Temp: 98 F (36.7 C)   TempSrc: Oral   Weight: 213 lb (96.6 kg)   Height: 5\' 10"  (1.778 m)     Physical Exam  Constitutional: He is oriented to person, place, and time. He appears distressed (appears to feel very fatigued).  HENT:  Head: Normocephalic and atraumatic.  Right Ear: External ear normal.  Left Ear: External ear normal.  Nose: Nose normal.  Mouth/Throat: Posterior oropharyngeal erythema present.  Neck: Normal range of motion. Neck supple. Carotid bruit is not present. No thyromegaly present.  Cardiovascular: Normal rate and regular rhythm.  Exam reveals no gallop and no friction rub.   No murmur heard. Pulmonary/Chest: Effort normal and breath sounds normal. No respiratory distress. He has no wheezes. He has no rales.  Musculoskeletal: He exhibits no edema.  Lymphadenopathy:    He has no cervical adenopathy.  Neurological: He is alert and oriented to person, place, and time.  Vitals reviewed.      Recent Results (from the past 2160 hour(s))  POCT Influenza A/B     Status: Abnormal   Collection Time: 10/10/16  2:00 PM  Result Value Ref Range   Influenza A, POC Negative Negative   Influenza B, POC Negative Negative     Assessment & Plan  Problem List Items Addressed This Visit      Cardiovascular and Mediastinum   Essential (primary) hypertension - Primary     Other   Fatigue   Relevant Orders   POCT Influenza A/B (Completed)   Acute viral syndrome      No orders of the defined types were placed in this encounter.  1. Essential (primary) hypertension Cont Amlodipine  2. Fatigue, unspecified type  - POCT Influenza A/B-neg for both  3. Acute viral syndrome Rest Fluids Inform us if sx. worsen

## 2017-01-26 ENCOUNTER — Encounter: Payer: Self-pay | Admitting: Family Medicine

## 2017-01-26 ENCOUNTER — Ambulatory Visit (INDEPENDENT_AMBULATORY_CARE_PROVIDER_SITE_OTHER): Payer: BC Managed Care – PPO | Admitting: Family Medicine

## 2017-01-26 VITALS — BP 136/86 | HR 68 | Temp 98.4°F | Resp 16 | Ht 70.0 in | Wt 210.0 lb

## 2017-01-26 DIAGNOSIS — N200 Calculus of kidney: Secondary | ICD-10-CM | POA: Diagnosis not present

## 2017-01-26 DIAGNOSIS — N390 Urinary tract infection, site not specified: Secondary | ICD-10-CM | POA: Diagnosis not present

## 2017-01-26 LAB — POCT URINALYSIS DIPSTICK
Bilirubin, UA: NEGATIVE
Glucose, UA: NEGATIVE
Ketones, UA: NEGATIVE
Leukocytes, UA: NEGATIVE
Nitrite, UA: NEGATIVE
PROTEIN UA: NEGATIVE
RBC UA: NEGATIVE
SPEC GRAV UA: 1.01 (ref 1.010–1.025)
UROBILINOGEN UA: NEGATIVE U/dL — AB
pH, UA: 5 (ref 5.0–8.0)

## 2017-01-26 MED ORDER — CIPROFLOXACIN HCL 500 MG PO TABS: 500.0000 mg | ORAL_TABLET | Freq: Two times a day (BID) | ORAL | 0 refills | Status: DC

## 2017-01-26 MED ORDER — TAMSULOSIN HCL 0.4 MG PO CAPS: 0.4000 mg | ORAL_CAPSULE | Freq: Every day | ORAL | 3 refills | Status: DC | PRN

## 2017-01-26 MED ORDER — POTASSIUM CITRATE ER 10 MEQ (1080 MG) PO TBCR: 10.0000 meq | EXTENDED_RELEASE_TABLET | Freq: Every day | ORAL | 1 refills | Status: DC | PRN

## 2017-01-26 NOTE — Patient Instructions (Signed)
Thank you for coming to the clinic today.  1. You have a Urinary Tract Infection - this is very common, your symptoms are reassuring and you should get better within 1 week on the antibiotics - Start Cipro 500mg  2 times daily for next 5 days, complete entire course, even if feeling better - We sent urine for a culture, we will call you within next few days if we need to change antibiotics - Please drink plenty of fluids, improve hydration over next 1 week  If symptoms worsening, developing nausea / vomiting, worsening back pain, fevers / chills / sweats, then please return for re-evaluation sooner.  Refilled Flomax, Potassium Citrate for potential kidney stones in future  Also you may have muscle spasm or strain as well, try to avoid heavy lifting for now  Referrral to establish for chronic kidney stones, call them in 2 weeks if you have not heard about appointment  [facility] [address] [place] Phone: [phone]  Please schedule a follow-up appointment with Dr. Parks Ranger in 2 weeks as needed for UTI / kidney stone  If you have any other questions or concerns, please feel free to call the clinic or send a message through [place]. You may also schedule an earlier appointment if necessary.  Nobie Putnam, DO [place]

## 2017-01-26 NOTE — Progress Notes (Signed)
Subjective:    Patient ID: Stephen Ray, male    DOB: November 14, 1965, 51 y.o.   MRN: [id number]  Stephen Ray is a 51 y.o. male presenting on 01/26/2017 for Urinary Tract Infection (burning had hx of kidney stone onset 3 days pt had taken Azo yesterday)  Patient presents for a same day appointment.  HPI  DYSURIA / URINARY URGENCY / Possible Cystitis / History of Recurrent Nephrolithiasis: - Patient reviews a chronic history of recurrent nephrolithiasis since a young age now >30+ years with dozens of stones, various sizes, often passes most stones, has had prior lithotripsy and other procedures as well. Previously followed by Dr Maryan Puls [facility]) for >20 years but he no longer wishes to see them due to difficulty with apt scheduling in past. He has done well without any recent stones, last >5 years ago, attributes this to lifestyle changes, moved and no longer uses well water, decreased stress and other diet changes. - Now today here with acute onset new symptoms for past 3 days with suprapubic discomfort some possible burning not only with urination, also with urinary urgency and some frequency. He started AZO OTC for 1-2 days with mild relief unclear if helping. - He is familiar with Renal Colic and states that this does "not feel like a kidney stone" but wanted to get it checked out early - Also has other history with umbilical hernia repair in past, and he does some heavy lifting at work, Museum/gallery curator self employed, did some heavy lifting recently, but no acute injury. Current symptoms associated with some Left flank vs mid low back muscle discomfort. - In past he has used Flomax, Potassium Citrate and other analgesia for relief. He has even used topical lidocaine to help pass stone. - Requests refill on Flomax and K-Citrate today - Also interested to establish with new other local Urology, but does not wish to return to see Dr Yves Dill - Denies any worsening dysuria, fevers/chills,  abdominal pain, nausea, vomiting, hematuria (now with urine color from AZO)  Past Surgical History:  Procedure Laterality Date  . CERVICAL FUSION  2001  . COLONOSCOPY WITH PROPOFOL N/A 03/31/2016   Procedure: COLONOSCOPY WITH PROPOFOL;  Surgeon: Lucilla Lame, MD;  Location: [place];  Service: Endoscopy;  Laterality: N/A;  . HERNIA REPAIR  2005  . LIPOMA EXCISION Right 2012   under arm  . LITHOTRIPSY  2002  . POLYPECTOMY  03/31/2016   Procedure: POLYPECTOMY;  Surgeon: Lucilla Lame, MD;  Location: [place];  Service: Endoscopy;;  . SHOULDER HEMI-ARTHROPLASTY Left 08/20/2015   Procedure: SHOULDER HEMI-ARTHROPLASTY;  Surgeon: Corky Mull, MD;  Location: [facility];  Service: Orthopedics;  Laterality: Left;  . SHOULDER SURGERY Right 12/25/2014  . TOTAL SHOULDER ARTHROPLASTY Right      Social History  Substance Use Topics  . Smoking status: Former Research scientist (life sciences)  . Smokeless tobacco: Never Used     Comment: quit 18 yrs ago  . Alcohol use 2.4 oz/week    4 Shots of liquor per week     Comment: Occasionally     Review of Systems Per HPI unless specifically indicated above     Objective:    BP 136/86 (BP Location: Left Arm, Cuff Size: Normal)   Pulse 68   Temp 98.4 F (36.9 C) (Oral)   Resp 16   Ht 5\' 10"  (1.778 m)   Wt 210 lb (95.3 kg)   BMI 30.13 kg/m   Wt Readings from Last 3  Encounters:  01/26/17 210 lb (95.3 kg)  10/10/16 213 lb (96.6 kg)  07/04/16 215 lb (97.5 kg)    Physical Exam  Constitutional: He appears well-developed and well-nourished. No distress.  Well-appearing, comfortable, cooperative, muscular build  HENT:  Head: Normocephalic and atraumatic.  Mouth/Throat: Oropharynx is clear and moist.  Eyes: Conjunctivae are normal.  Cardiovascular: Normal rate, regular rhythm, normal heart sounds and intact distal pulses.   No murmur heard. Pulmonary/Chest: Effort normal and breath sounds normal. No respiratory distress. He has no wheezes. He has no  rales.  Abdominal: Soft. Bowel sounds are normal. He exhibits no distension and no mass. There is no tenderness. There is no rebound.  Lower suprapubic area with sense of urgency and discomfort on palpation without distinct tenderness localized. Also area left mid flank non reproducible tenderness.  Prior repaired umbilical hernia appears normal without herniation.  Musculoskeletal: Normal range of motion. He exhibits no edema.  Old scarring bilateral anterior shoulders from rotator cuff surgery  Mid/Low Back Inspection: Mostly normal appearance some asymmetry with paraspinal muscles upper lumbar with likely deeper lipoma Palpation: Some paraspinal muscle hypertonicity L > R, moderate sized fullness consistent with possible lipoma ROM: Full active ROM forward flex/back ext, rotation Special Testing: SLR negative Strength: distally intact Neurovascular: distally intact  Neurological: He is alert.  Skin: Skin is warm and dry. No rash noted. He is not diaphoretic. No erythema.  Psychiatric: He has a normal mood and affect. His behavior is normal.  Nursing note and vitals reviewed.    Results for orders placed or performed in visit on 01/26/17  POCT Urinalysis Dipstick  Result Value Ref Range   Color, UA amber    Clarity, UA clear    Glucose, UA negative    Bilirubin, UA negative    Ketones, UA negative    Spec Grav, UA 1.010 1.010 - 1.025   Blood, UA negative    pH, UA 5.0 5.0 - 8.0   Protein, UA negative    Urobilinogen, UA negative (A) 0.2 or 1.0 E.U./dL   Nitrite, UA negative    Leukocytes, UA Negative Negative      Assessment & Plan:   Problem List Items Addressed This Visit    Recurrent nephrolithiasis    Currently stable, unlikely acute nephrolithiasis based on history and exam. UA without Hgb (but had AZO) less likely to stone  Chronic history of recurrent kidney stones >30+ years, calcium oxalate and possibly citric acid stones by description, do not have records from  Dr Yves Dill [facility]) available at this time - Previously passes most stones, has had variety of treatments including lithotripsy, has not had recent imaging - Requests re-establish with new local urology, referred to [place], he no longer wishes to see Dr Yves Dill due to problem with scheduling apt with acute kidney stone - Refills given today for Flomax and Potassium Citrate for PRN use if stones in future      Relevant Medications   tamsulosin (FLOMAX) 0.4 MG CAPS capsule   potassium citrate (UROCIT-K) 10 MEQ (1080 MG) SR tablet   Other Relevant Orders   Ambulatory referral to Urology    Other Visit Diagnoses    Urinary tract infection without hematuria, site unspecified    -  Primary  Clinically suspicious for UTI but UA is unremarkable (did take AZO though). No recent UTIs or abx courses. - Complicated by chronic recurrent nephrolithiasis, clinically less likely today - No concern for pyelo today (no systemic symptoms,  neg fever, n/v).  Plan: 1. Ordered Urine culture 3. Empiric antibiotics with Cipro 500mg  BID x 5 days 4. Improve PO hydration 5. Follow-up if not improving 1-2 weeks, red flags given to return sooner    Relevant Medications   ciprofloxacin (CIPRO) 500 MG tablet   Other Relevant Orders   POCT Urinalysis Dipstick (Completed)   Urine culture      Meds ordered this encounter  Medications  . lisinopril (PRINIVIL,ZESTRIL) 40 MG tablet  . ciprofloxacin (CIPRO) 500 MG tablet    Sig: Take 1 tablet (500 mg total) by mouth 2 (two) times daily. For 5 days    Dispense:  10 tablet    Refill:  0  . tamsulosin (FLOMAX) 0.4 MG CAPS capsule    Sig: Take 1 capsule (0.4 mg total) by mouth daily as needed. For kidney stone    Dispense:  30 capsule    Refill:  3  . potassium citrate (UROCIT-K) 10 MEQ (1080 MG) SR tablet    Sig: Take 1 tablet (10 mEq total) by mouth daily as needed. Kidney stones    Dispense:  30 tablet    Refill:  1    Follow up  plan: Return in about 2 weeks (around 02/09/2017), or if symptoms worsen or fail to improve, for UTI / Kidney stone.  Nobie Putnam, DO [facility] 01/26/2017, 1:29 PM

## 2017-01-26 NOTE — Assessment & Plan Note (Addendum)
Currently stable, unlikely acute nephrolithiasis based on history and exam. UA without Hgb (but had AZO) less likely to stone  Chronic history of recurrent kidney stones >30+ years, calcium oxalate and possibly citric acid stones by description, do not have records from Dr Yves Dill [facility]) available at this time - Previously passes most stones, has had variety of treatments including lithotripsy, has not had recent imaging - Requests re-establish with new local urology, referred to [place], he no longer wishes to see Dr Yves Dill due to problem with scheduling apt with acute kidney stone - Refills given today for Flomax and Potassium Citrate for PRN use if stones in future

## 2017-01-27 LAB — URINE CULTURE: Organism ID, Bacteria: NO GROWTH

## 2017-01-30 NOTE — Progress Notes (Signed)
The pt was notified about his urine cx. He wanted me to let you know he passed a kidney stone the very next day after he came in the office.

## 2017-02-06 ENCOUNTER — Ambulatory Visit: Payer: BC Managed Care – PPO | Admitting: Urology

## 2017-02-06 ENCOUNTER — Encounter: Payer: Self-pay | Admitting: Urology

## 2017-02-06 VITALS — BP 142/84 | HR 71 | Ht 70.0 in | Wt 208.8 lb

## 2017-02-06 DIAGNOSIS — N200 Calculus of kidney: Secondary | ICD-10-CM

## 2017-02-06 LAB — URINALYSIS, COMPLETE
BILIRUBIN UA: NEGATIVE
Glucose, UA: NEGATIVE
KETONES UA: NEGATIVE
LEUKOCYTES UA: NEGATIVE
NITRITE UA: NEGATIVE
Protein, UA: NEGATIVE
RBC UA: NEGATIVE
SPEC GRAV UA: 1.015 (ref 1.005–1.030)
Urobilinogen, Ur: 0.2 mg/dL (ref 0.2–1.0)
pH, UA: 7.5 (ref 5.0–7.5)

## 2017-02-06 NOTE — Progress Notes (Signed)
02/06/2017 10:38 AM   [name] [birth year] [id number]  Referring provider: Olin Hauser, DO [address] [place], [place] [address]  Chief Complaint  Patient presents with  . Nephrolithiasis    New Patient    HPI: 51 year old male who presents today for further management and evaluation of nephrolithiasis. The patient has an extensive history of kidney stones, he has passed many stones in his life, starting at the age of 42. He is able to tolerate this for the most part, has been able to avoid urologist mostly. He has been seen and followed in the past, but has not seen anyone in quite some time.  The patient was doing well for the last several years, but noted severe pain in his flank with associated urinary tract symptoms including frequency and urgency. He subsequently passed a stone, which he brought a picture of today area the patient was seen by his primary care doctor recently, he was started on tamsulosin as well as Urocit-K at that time. He was also given some pain Medication to help with the symptoms. The patient has not had any imaging in quite some Time.  The patient does not recall ever undergoing a comprehensive metabolic stone evaluation. He does rememberbeing told that he makes calcium stones.   PMH: Past Medical History:  Diagnosis Date  . Anxiety   . Arthritis    shoulders (before replacements)  . Asthma    as child  . Hypertension   . Kidney stones   . Motion sickness    deep sea fishing  . Shoulder pain    left and right    Surgical History: Past Surgical History:  Procedure Laterality Date  . CERVICAL FUSION  2001  . COLONOSCOPY WITH PROPOFOL N/A 03/31/2016   Procedure: COLONOSCOPY WITH PROPOFOL;  Surgeon: Lucilla Lame, MD;  Location: [facility];  Service: Endoscopy;  Laterality: N/A;  . HERNIA REPAIR  2005  . LIPOMA EXCISION Right 2012   under arm  . LITHOTRIPSY  2002  . POLYPECTOMY  03/31/2016   Procedure: POLYPECTOMY;   Surgeon: Lucilla Lame, MD;  Location: [facility];  Service: Endoscopy;;  . SHOULDER HEMI-ARTHROPLASTY Left 08/20/2015   Procedure: SHOULDER HEMI-ARTHROPLASTY;  Surgeon: Corky Mull, MD;  Location: [facility];  Service: Orthopedics;  Laterality: Left;  . SHOULDER SURGERY Right 12/25/2014  . TOTAL SHOULDER ARTHROPLASTY Right     Home Medications:  Allergies as of 02/06/2017      Reactions   Shellfish Allergy Anaphylaxis   Shellfish-derived Products Anaphylaxis      Medication List       Accurate as of 02/06/17 10:38 AM. Always use your most recent med list.          amLODipine 5 MG tablet Commonly known as:  NORVASC Take 1 tablet (5 mg total) by mouth daily.   DULoxetine 30 MG capsule Commonly known as:  CYMBALTA TAKE ONE CAPSULE BY MOUTH ONCE DAILY *NEEDS  APPOINTMENT  TO  CONTINUE  TO  GET  THIS*   gabapentin 300 MG capsule Commonly known as:  NEURONTIN Take 1 tablet twice a day and 2 at bedtime.   ibuprofen 200 MG tablet Commonly known as:  ADVIL,MOTRIN Take 600 mg by mouth daily as needed for moderate pain.   lidocaine 5 % Commonly known as:  LIDODERM Place 2 patches onto the skin daily. 1 patch to each shoulder   lisinopril 40 MG tablet Commonly known as:  PRINIVIL,ZESTRIL   potassium  citrate 10 MEQ (1080 MG) SR tablet Commonly known as:  UROCIT-K Take 1 tablet (10 mEq total) by mouth daily as needed. Kidney stones   tamsulosin 0.4 MG Caps capsule Commonly known as:  FLOMAX Take 1 capsule (0.4 mg total) by mouth daily as needed. For kidney stone       Allergies:  Allergies  Allergen Reactions  . Shellfish Allergy Anaphylaxis  . Shellfish-Derived Products Anaphylaxis    Family History: Family History  Problem Relation Age of Onset  . Nephrolithiasis Father   . Kidney cancer Neg Hx   . Prostate cancer Neg Hx     Social History:  reports that he has quit smoking. He has never used smokeless tobacco. He reports that he drinks about 2.4 oz of  alcohol per week . He reports that he does not use drugs.  ROS: UROLOGY Frequent Urination?: No Hard to postpone urination?: No Burning/pain with urination?: Yes Get up at night to urinate?: No Leakage of urine?: No Urine stream starts and stops?: Yes Trouble starting stream?: Yes Do you have to strain to urinate?: Yes Blood in urine?: Yes Urinary tract infection?: No Sexually transmitted disease?: No Injury to kidneys or bladder?: No Painful intercourse?: No Weak stream?: Yes Erection problems?: No Penile pain?: No  Gastrointestinal Nausea?: Yes Vomiting?: No Indigestion/heartburn?: No Diarrhea?: No Constipation?: No  Constitutional Fever: No Night sweats?: Yes Weight loss?: No Fatigue?: Yes  Skin Skin rash/lesions?: No Itching?: No  Eyes Blurred vision?: No Double vision?: No  Ears/Nose/Throat Sore throat?: No Sinus problems?: No  Hematologic/Lymphatic Swollen glands?: No Easy bruising?: No  Cardiovascular Leg swelling?: No Chest pain?: No  Respiratory Cough?: No Shortness of breath?: No  Endocrine Excessive thirst?: No  Musculoskeletal Back pain?: Yes Joint pain?: No  Neurological Headaches?: No Dizziness?: No  Psychologic Depression?: No Anxiety?: Yes  Physical Exam: BP (!) 142/84 (BP Location: Left Arm, Patient Position: Sitting, Cuff Size: Normal)   Pulse 71   Ht 5\' 10"  (1.778 m)   Wt 94.7 kg (208 lb 12.8 oz)   BMI 29.96 kg/m   Constitutional:  Alert and oriented, No acute distress. HEENT: [place] AT, moist mucus membranes.  Trachea midline, no masses. Cardiovascular: No clubbing, cyanosis, or edema. Respiratory: Normal respiratory effort, no increased work of breathing. GI: Abdomen is soft, nontender, nondistended, no abdominal masses GU: No CVA tenderness.  Skin: No rashes, bruises or suspicious lesions. Lymph: No cervical or inguinal adenopathy. Neurologic: Grossly intact, no focal deficits, moving all 4  extremities. Psychiatric: Normal mood and affect.  Laboratory Data: Lab Results  Component Value Date   WBC 6.1 06/02/2016   HGB 14.6 06/02/2016   HCT 42.3 06/02/2016   MCV 92.8 06/02/2016   PLT 253 06/02/2016    Lab Results  Component Value Date   CREATININE 0.86 07/04/2016    No results found for: PSA  No results found for: TESTOSTERONE  No results found for: HGBA1C  Urinalysis    Component Value Date/Time   COLORURINE YELLOW (A) 08/05/2015 1036   APPEARANCEUR CLEAR (A) 08/05/2015 1036   APPEARANCEUR Clear 12/11/2014 0934   LABSPEC 1.014 08/05/2015 1036   LABSPEC 1.013 12/11/2014 0934   PHURINE 6.0 08/05/2015 [place] 08/05/2015 1036   GLUCOSEU Negative 12/11/2014 0934   HGBUR NEGATIVE 08/05/2015 1036   BILIRUBINUR negative 01/26/2017 [place] Negative 12/11/2014 [place] 08/05/2015 1036   PROTEINUR negative 01/26/2017 1132   PROTEINUR NEGATIVE 08/05/2015 1036   UROBILINOGEN  negative (A) 01/26/2017 1132   NITRITE negative 01/26/2017 1132   NITRITE NEGATIVE 08/05/2015 1036   LEUKOCYTESUR Negative 01/26/2017 1132   LEUKOCYTESUR Negative 12/11/2014 0934    Pertinent Imaging: The patient's CT scan in 2014 demonstrates bilateral small nonobstructing stones  Assessment & Plan:  The patient presents today for further evaluation and management of chronic nephrolithiasis. He is currently asymptomatic, but recently did pass several stones. He has not had any recent imaging, and as such the stone burden that he has is currently unknown. We discussed management strategies I recommended that we obtain imaging today, specifically a KUB to help identify his stone burden. We then discussed treatment strategies for nonobstructing stones, no recommended that we manage this conservatively with repeat imaging in 1 year.  We also discussed stone prevention strategies in significant detail. I recommended at the patient increase his fluid  intake to 1 gallon of water/lemonade daily. We talked about the importance of citrate. We also discussed minimizing his salt intake, avoiding vitamin C, minimizing animal protein to 1 serving sizes per day, and avoiding tea, coffee, soda. We also discussed maintaining a low oxalate diet and balancing this with his calcium intake.  We will plan to see what his stone burden is, and follow-up accordingly. If he has a low stone burden, we can follow-up in one year, if his stone burden is severe, I would recommend that we follow-up in 6 months.If any interval follow-up there is progression of his stone disease, we will plan to perform a more complete evaluationincluding a 24-hour urine collection.  1. Nephrolithiasis Will check KUB today and assess stone burden, f/u will be based on  - Urinalysis, Complete - DG Abd 1 View; Future   Return in about 6 months (around 08/09/2017) for w/ KUB prior.  Ardis Hughs, [facility] [address] [place], [place] [address] [phone]

## 2017-02-09 ENCOUNTER — Ambulatory Visit
Admission: RE | Admit: 2017-02-09 | Discharge: 2017-02-09 | Disposition: A | Discharge status: Home / Self Care | Payer: BC Managed Care – PPO | Source: Ambulatory Visit | Attending: Urology | Admitting: Urology

## 2017-02-09 ENCOUNTER — Telehealth: Payer: Self-pay | Admitting: Urology

## 2017-02-09 DIAGNOSIS — N200 Calculus of kidney: Secondary | ICD-10-CM | POA: Insufficient documentation

## 2017-02-09 NOTE — Telephone Encounter (Signed)
Stephen Hughs, MD  Lestine Box, LPN        Called and spoke to patient. Went over the [employer]. Recommended that he follow-up in one year. Can you reschedule him with an XRAY prior.   Thanks, ben    Can you make the 69yr f/u?

## 2017-02-09 NOTE — Telephone Encounter (Signed)
Please advise 

## 2017-02-09 NOTE — Telephone Encounter (Signed)
Pt called office from [facility] asking to have his results read/given to him as soon as they are available.  Pt states that Dr. Louis Meckel wanted him to have xray done day of appt, with a follow up visit in 6 mos. However, the pt states he does not want to wait 6 months for his results nor does he want to pay $85 to come in to have the results given to him in office.  Please advise. Thanks.

## 2017-03-28 ENCOUNTER — Other Ambulatory Visit: Payer: Self-pay | Admitting: Family Medicine

## 2017-03-28 DIAGNOSIS — M792 Neuralgia and neuritis, unspecified: Secondary | ICD-10-CM

## 2017-03-28 NOTE — Telephone Encounter (Signed)
Pt. Called requesting refill on gabapentin  [place].  Pt. Call back  # [phone]

## 2017-03-29 ENCOUNTER — Other Ambulatory Visit: Payer: Self-pay

## 2017-03-29 DIAGNOSIS — M792 Neuralgia and neuritis, unspecified: Secondary | ICD-10-CM

## 2017-03-29 MED ORDER — GABAPENTIN 300 MG PO CAPS: ORAL_CAPSULE | ORAL | 5 refills | Status: DC

## 2017-03-29 NOTE — Telephone Encounter (Signed)
Rx sen pt is aware.

## 2017-04-18 ENCOUNTER — Other Ambulatory Visit: Payer: Self-pay | Admitting: Family Medicine

## 2017-04-18 DIAGNOSIS — F419 Anxiety disorder, unspecified: Secondary | ICD-10-CM

## 2017-04-18 MED ORDER — DULOXETINE HCL 30 MG PO CPEP: ORAL_CAPSULE | ORAL | 2 refills | Status: DC

## 2017-04-25 ENCOUNTER — Ambulatory Visit (INDEPENDENT_AMBULATORY_CARE_PROVIDER_SITE_OTHER): Payer: BC Managed Care – PPO | Admitting: Podiatry

## 2017-04-25 ENCOUNTER — Encounter: Payer: Self-pay | Admitting: Podiatry

## 2017-04-25 ENCOUNTER — Encounter: Payer: Self-pay | Admitting: Family Medicine

## 2017-04-25 ENCOUNTER — Ambulatory Visit (INDEPENDENT_AMBULATORY_CARE_PROVIDER_SITE_OTHER): Payer: BC Managed Care – PPO

## 2017-04-25 DIAGNOSIS — M79671 Pain in right foot: Secondary | ICD-10-CM

## 2017-04-25 DIAGNOSIS — M722 Plantar fascial fibromatosis: Secondary | ICD-10-CM | POA: Diagnosis not present

## 2017-04-25 NOTE — Progress Notes (Signed)
Patient was seen by [facility] Podiatry today 04/25/17 for Plantar Fasciitis and was given an AVS handout that include reported "opiate dependence". He presented to our office this afternoon approx 4:45pm to report this problem and provided a complaint to our office staff requesting that we change the problem list diagnosis in his chart. He states that he is no longer on opiate medicine, this was for variety of other problems in the past including surgical history with shoulder surgery back to 2016.  The only office visit I have seen patient for was on 01/26/17 for an acute same day visit for UTI/Kidney stone, due to acute nature we did not completely discuss the entirety of his past medical history, but his medication list was up to date at that time. And to best of my knowledge he was not taking any opiate medicines. At his request today, I reviewed chart and identified the problem list item "Drug abuse, opioid type" this was entered originally into problem list on 03/30/15, date of previous office visit with prior PCP Dr Luan Pulling here at [facility], reviewed office visit note at that time.  I have not prescribed him any opiates, and reviewed recent chart without any known opiate use. I checked the [place] for past 1 year, and no controlled substances rx in [place] for him. Therefore, I agree to resolve this problem list item and remove it from active list.  This was completed and patient was notified by office manager after he was still in the waiting room for the update. He was verbally still upset overall about the entire situation, even though the old diagnosis from 2016 had been resolved today. Ultimately he was still not pleased with the responses given to him about this and other situations, for instance when he has called in requesting refill and would not adhere to office policy for routine refill procedures, today he left the office after verbally stating that we were "fired".  Nobie Putnam, [facility] 04/25/2017, 5:25 PM

## 2017-04-26 MED ORDER — IBUPROFEN-FAMOTIDINE 800-26.6 MG PO TABS: ORAL_TABLET | ORAL | 1 refills | Status: DC

## 2017-04-29 MED ORDER — BETAMETHASONE SOD PHOS & ACET 6 (3-3) MG/ML IJ SUSP: 3.0000 mg | Freq: Once | INTRAMUSCULAR | Status: DC

## 2017-04-29 NOTE — Progress Notes (Signed)
   Subjective: Patient presents today for pain and tenderness in the right foot. Patient states the foot pain has been hurting for several weeks now. Patient states that it hurts in the mornings with the first steps out of bed. Patient presents today for further treatment and evaluation  Objective: Physical Exam General: The patient is alert and oriented x3 in no acute distress.  Dermatology: Skin is warm, dry and supple bilateral lower extremities. Negative for open lesions or macerations bilateral.   Vascular: Dorsalis Pedis and Posterior Tibial pulses palpable bilateral.  Capillary fill time is immediate to all digits.  Neurological: Epicritic and protective threshold intact bilateral.   Musculoskeletal: Tenderness to palpation at the medial calcaneal tubercale and through the insertion of the plantar fascia of the right foot. All other joints range of motion within normal limits bilateral. Strength 5/5 in all groups bilateral.   Radiographic exam: Normal osseous mineralization. Joint spaces preserved. No fracture/dislocation/boney destruction. Calcaneal spur present with mild thickening of plantar fascia right. No other soft tissue abnormalities or radiopaque foreign bodies.   Assessment: 1. Plantar fasciitis right 2. Pain in right foot  Plan of Care:  1. Patient evaluated. Xrays reviewed.   2. Injection of 0.5cc Celestone soluspan injected into the right plantar fascia  3. Prescription for Duexis sent to [facility] 4. Plantar fascial band(s) dispensed 5. Instructed patient regarding therapies and modalities at home to alleviate symptoms.  6. Recommend wearing good shoe gear. Recommend purchasing new shoes at [employer]  7. Return to clinic in 4 weeks.     Edrick Kins, DPM [facility]  Dr. Edrick Kins, DPM    [address], [place] [address]                Office [phone]  Fax [phone]

## 2017-05-23 ENCOUNTER — Ambulatory Visit (INDEPENDENT_AMBULATORY_CARE_PROVIDER_SITE_OTHER): Payer: BC Managed Care – PPO | Admitting: Podiatry

## 2017-05-23 DIAGNOSIS — M722 Plantar fascial fibromatosis: Secondary | ICD-10-CM | POA: Diagnosis not present

## 2017-05-26 NOTE — Progress Notes (Signed)
   Subjective: Patient presents today for follow up evaluation of pain and tenderness in the right foot. He states the injections helped relieve the pain for approximately three days. Taking the Duexis and wearing the fascial braces help alleviate his pain. He denies any new complaints at this time.   Objective: Physical Exam General: The patient is alert and oriented x3 in no acute distress.  Dermatology: Skin is warm, dry and supple bilateral lower extremities. Negative for open lesions or macerations bilateral.   Vascular: Dorsalis Pedis and Posterior Tibial pulses palpable bilateral.  Capillary fill time is immediate to all digits.  Neurological: Epicritic and protective threshold intact bilateral.   Musculoskeletal: Tenderness to palpation at the medial calcaneal tubercale and through the insertion of the plantar fascia of the right foot. All other joints range of motion within normal limits bilateral. Strength 5/5 in all groups bilateral.   Assessment: 1. Plantar fasciitis right 2. Pain in right foot  Plan of Care:  1. Patient evaluated. 2. Injection of 0.5cc Celestone soluspan injected into the right plantar fascia. 3. Continue wearing braces and taking Duexis.  4. Night splint dispensed.  5. Return to clinic in four weeks.  Pt goes on long hikes on the weekends.      Edrick Kins, DPM [facility]  Dr. Edrick Kins, DPM    [address], [place] [address]                Office [phone]  Fax [phone]

## 2017-05-29 MED ORDER — BETAMETHASONE SOD PHOS & ACET 6 (3-3) MG/ML IJ SUSP: 3.0000 mg | Freq: Once | INTRAMUSCULAR | Status: DC

## 2017-06-20 ENCOUNTER — Ambulatory Visit (INDEPENDENT_AMBULATORY_CARE_PROVIDER_SITE_OTHER): Payer: BC Managed Care – PPO | Admitting: Podiatry

## 2017-06-20 ENCOUNTER — Encounter: Payer: Self-pay | Admitting: Podiatry

## 2017-06-20 DIAGNOSIS — M722 Plantar fascial fibromatosis: Secondary | ICD-10-CM

## 2017-06-21 NOTE — Progress Notes (Signed)
   Subjective: Patient presents today for follow up evaluation of plantar fasciitis of the right foot. He states his pain is now worse. He rates it at 5-6/10. He reports associated swelling of the heel. He has been taking Duexis with some relief of the pain. He states the injection he received only helped for approximately 3 days. He is here for further evaluation and treatment.   Past Medical History:  Diagnosis Date  . Anxiety   . Arthritis    shoulders (before replacements)  . Asthma    as child  . Hypertension   . Kidney stones   . Motion sickness    deep sea fishing  . Shoulder pain    left and right    Objective: Physical Exam General: The patient is alert and oriented x3 in no acute distress.  Dermatology: Skin is warm, dry and supple bilateral lower extremities. Negative for open lesions or macerations bilateral.   Vascular: Dorsalis Pedis and Posterior Tibial pulses palpable bilateral.  Capillary fill time is immediate to all digits.  Neurological: Epicritic and protective threshold intact bilateral.   Musculoskeletal: Tenderness to palpation at the medial calcaneal tubercale and through the insertion of the plantar fascia of the right foot. All other joints range of motion within normal limits bilateral. Strength 5/5 in all groups bilateral.   Assessment: 1. Plantar fasciitis right 2. Pain in right foot  Plan of Care:  1. Patient evaluated. 2. Injection of 0.5cc Celestone soluspan injected into the right plantar fascia. 3. Continue wearing brace, night splint and taking Duexis.  4. Prescription for physical therapy was given to patient. 5. Return to clinic in 4 weeks.  Pt goes on long hikes on the weekends.      Edrick Kins, DPM [facility]  Dr. Edrick Kins, DPM    [address], [place] [address]                Office [phone]  Fax [phone]

## 2017-07-15 ENCOUNTER — Other Ambulatory Visit: Payer: Self-pay | Admitting: Family Medicine

## 2017-07-15 DIAGNOSIS — F419 Anxiety disorder, unspecified: Secondary | ICD-10-CM

## 2017-07-18 ENCOUNTER — Ambulatory Visit: Payer: BC Managed Care – PPO | Admitting: Podiatry

## 2017-08-08 ENCOUNTER — Ambulatory Visit: Payer: BC Managed Care – PPO

## 2017-08-09 ENCOUNTER — Ambulatory Visit: Payer: BC Managed Care – PPO | Admitting: Physician Assistant

## 2017-08-09 ENCOUNTER — Encounter: Payer: Self-pay | Admitting: Physician Assistant

## 2017-08-09 VITALS — BP 122/70 | HR 72 | Temp 97.8°F | Resp 16 | Ht 70.0 in | Wt 216.0 lb

## 2017-08-09 DIAGNOSIS — Z0000 Encounter for general adult medical examination without abnormal findings: Secondary | ICD-10-CM | POA: Diagnosis not present

## 2017-08-09 DIAGNOSIS — F419 Anxiety disorder, unspecified: Secondary | ICD-10-CM

## 2017-08-09 DIAGNOSIS — I10 Essential (primary) hypertension: Secondary | ICD-10-CM | POA: Diagnosis not present

## 2017-08-09 DIAGNOSIS — E781 Pure hyperglyceridemia: Secondary | ICD-10-CM | POA: Diagnosis not present

## 2017-08-09 LAB — COMPLETE METABOLIC PANEL WITH GFR
AG Ratio: 1.6 (calc) (ref 1.0–2.5)
ALT: 44 U/L (ref 9–46)
AST: 24 U/L (ref 10–35)
Albumin: 4.2 g/dL (ref 3.6–5.1)
Alkaline phosphatase (APISO): 81 U/L (ref 40–115)
BUN: 10 mg/dL (ref 7–25)
CO2: 30 mmol/L (ref 20–32)
Calcium: 9.3 mg/dL (ref 8.6–10.3)
Chloride: 103 mmol/L (ref 98–110)
Creat: 0.92 mg/dL (ref 0.70–1.33)
GFR, Est African American: 111 mL/min/{1.73_m2} (ref >=60)
GFR, Est Non African American: 96 mL/min/{1.73_m2} (ref >=60)
Globulin: 2.6 g/dL (calc) (ref 1.9–3.7)
Glucose, Bld: 128 mg/dL — ABNORMAL HIGH (ref 65–99)
Potassium: 3.6 mmol/L (ref 3.5–5.3)
Sodium: 140 mmol/L (ref 135–146)
Total Bilirubin: 0.3 mg/dL (ref 0.2–1.2)
Total Protein: 6.8 g/dL (ref 6.1–8.1)

## 2017-08-09 LAB — LIPID PANEL
Cholesterol: 189 mg/dL (ref <200)
HDL: 37 mg/dL — ABNORMAL LOW (ref >40)
LDL Cholesterol (Calc): 107 mg/dL (calc) — ABNORMAL HIGH
Non-HDL Cholesterol (Calc): 152 mg/dL (calc) — ABNORMAL HIGH (ref <130)
Total CHOL/HDL Ratio: 5.1 (calc) — ABNORMAL HIGH (ref <5.0)
Triglycerides: 339 mg/dL — ABNORMAL HIGH (ref <150)

## 2017-08-09 MED ORDER — AMLODIPINE BESYLATE 5 MG PO TABS: 5.0000 mg | ORAL_TABLET | Freq: Every day | ORAL | 1 refills | Status: DC

## 2017-08-09 MED ORDER — DULOXETINE HCL 30 MG PO CPEP: 30.0000 mg | ORAL_CAPSULE | Freq: Every day | ORAL | 1 refills | Status: DC

## 2017-08-09 NOTE — Progress Notes (Signed)
Patient: Stephen Ray Male    DOB: 1966/04/02   51 y.o.   MRN: [id number] Visit Date: 08/09/2017  Today's Provider: Trinna Post, PA-C   Chief Complaint  Patient presents with  . New Patient (Initial Visit)  . Hypertension   Subjective:    Stephen Ray is a 51 y/o man presenting today to establish care. He says he comes from "the smokers facility at [place]," referring to [facility] and says he left the clinic because "the doctors and the nurses smoke together in front of the door," among other reasons.  He lives in [place] with his wife of five years. His wife is a patient here, Retail buyer. He has been divorced at least once. He has two daughters, ages 35 and 59 who are doing well.    He has a 6 pack year smoking history, quit when he was 28. Says he uses alcohol "ocassionally." Does not use drugs. When I ask these questions, he says "You know I already filled out the paperwork."  He had a colonoscopy 03/2016 that showed tubular adenoma. He should repeat in 51 No family history of colon cancer or prostate cancer.  He has a history of hypertension. It is controlled on 5 mg amlodipine and 40 mg Lisinopril. He has a history of low potassium on prior labs. He is due for labwork. He says he had "all" labwork done several months ago at [facility]. Then remarks that perhaps somebody from the clinic erased it.  He has a history of nephrolithiasis, sees urology for this.   He has had bilateral total shoulder arthroplasty. 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 Says it feels like his right arm is dipped in wax. He uses gabapentin for this. Says he would rather not use narcotics.   He sees podiatry and receives injections for plantar fasciitis. He also recently started physical therapy for this.  He was previously on Cymbalta 30 mg QD. He says he has been born saying whatever comes to his mind and  says this medication helps him do that less. He says he has been out of this medication for three weeks and would like a refill. He also says "I am not a depressed person. It's more anxiety." He repeats "I am not a depressed person" several times throughout our visit. Then says "So please word that correctly because that will haunt you [myself]. I am not a depressed person." He then goes on to say that he doesn't want to "cut my throat 20 years from now, little things like that when they change the law."     Allergies  Allergen Reactions  . Shellfish Allergy Anaphylaxis  . Shellfish-Derived Products Anaphylaxis     Current Outpatient Medications:  .  amLODipine (NORVASC) 5 MG tablet, Take 1 tablet (5 mg total) by mouth daily., Disp: 90 tablet, Rfl: 1 .  gabapentin (NEURONTIN) 300 MG capsule, Take 1 tablet twice a day and 2 at bedtime., Disp: 120 capsule, Rfl: 5 .  ibuprofen (ADVIL,MOTRIN) 200 MG tablet, Take 600 mg by mouth daily as needed for moderate pain., Disp: , Rfl:  .  lisinopril (PRINIVIL,ZESTRIL) 40 MG tablet, , Disp: , Rfl:  .  DULoxetine (CYMBALTA) 30 MG capsule, Take 1 capsule (30 mg total) by mouth daily., Disp: 90 capsule, Rfl: 1  Current Facility-Administered Medications:  .  betamethasone acetate-betamethasone sodium phosphate (CELESTONE) injection 3  mg, 3 mg, Intramuscular, Once, Evans, Brent M, DPM .  betamethasone acetate-betamethasone sodium phosphate (CELESTONE) injection 3 mg, 3 mg, Intramuscular, Once, Edrick Kins, DPM  Review of Systems  Constitutional: Positive for fatigue.  HENT: Negative.   Eyes: Negative.   Respiratory: Negative.   Cardiovascular: Negative.   Gastrointestinal: Negative.   Endocrine: Negative.   Genitourinary: Negative.   Musculoskeletal: Negative.   Skin: Negative.   Allergic/Immunologic: Positive for environmental allergies.  Neurological: Negative.   Hematological: Negative.   Psychiatric/Behavioral: Negative.     Social History    Tobacco Use  . Smoking status: Former Research scientist (life sciences)  . Smokeless tobacco: Never Used  . Tobacco comment: quit 51 yrs ago  Substance Use Topics  . Alcohol use: Yes    Alcohol/week: 2.4 oz    Types: 4 Shots of liquor per week    Comment: Occasionally    Objective:   BP 122/70 (BP Location: Left Arm, Patient Position: Sitting, Cuff Size: Large)   Pulse 72   Temp 97.8 F (36.6 C) (Oral)   Resp 16   Ht 5\' 10"  (1.778 m)   Wt 216 lb (98 kg)   BMI 30.99 kg/m  Vitals:   08/09/17 1019  BP: 122/70  Pulse: 72  Resp: 16  Temp: 97.8 F (36.6 C)  TempSrc: Oral  Weight: 216 lb (98 kg)  Height: 5\' 10"  (1.778 m)     Physical Exam  Constitutional: He is oriented to person, place, and time. He appears well-developed and well-nourished.  HENT:  Right Ear: Tympanic membrane and external ear normal.  Left Ear: Tympanic membrane and external ear normal.  Mouth/Throat: Oropharynx is clear and moist. No oropharyngeal exudate.  Eyes: Conjunctivae are normal.  Cardiovascular: Normal rate and regular rhythm.  Pulmonary/Chest: Effort normal and breath sounds normal. No respiratory distress. He has no wheezes.  Abdominal: Soft. Bowel sounds are normal.  Neurological: He is alert and oriented to person, place, and time.  Skin: Skin is warm and dry.  Psychiatric: He has a normal mood and affect. His behavior is normal.        Assessment & Plan:     1. Annual physical exam  Flu shot updated. Colonoscopy UTD.   2. Hypertension, unspecified type  Continue current medication. Labwork today. 1 year if stable, 6 months repeat labs if abnormalities.  3. High triglycerides  - Lipid Profile  4. Anxiety  Refilled cymbalta. Assured patient that depression is not in his chart. He is vaguely threatening in his mannerisms regarding this matter, implying if I were to document depression that it would haunt me, and that he continues to talk about how he's had to get things removed before and he has no  problem doing so.  - DULoxetine (CYMBALTA) 30 MG capsule; Take 1 capsule (30 mg total) by mouth daily.  Dispense: 90 capsule; Refill: 1  5. Essential (primary) hypertension  - amLODipine (NORVASC) 5 MG tablet; Take 1 tablet (5 mg total) by mouth daily.  Dispense: 90 tablet; Refill: 1 - Comprehensive Metabolic Panel (CMET) - Lipid Profile  Return in about 1 year (around 08/09/2018) for cpe.  The entirety of the information documented in the History of Present Illness, Review of Systems and Physical Exam were personally obtained by me. Portions of this information were initially documented by [place], [place] and reviewed by me for thoroughness and accuracy.           Trinna Post, PA-C  [facility]

## 2017-08-09 NOTE — Patient Instructions (Signed)

## 2017-08-15 ENCOUNTER — Telehealth: Payer: Self-pay

## 2017-08-15 ENCOUNTER — Other Ambulatory Visit: Payer: Self-pay | Admitting: Physician Assistant

## 2017-08-15 MED ORDER — LISINOPRIL 40 MG PO TABS: 40.0000 mg | ORAL_TABLET | Freq: Every day | ORAL | 1 refills | Status: DC

## 2017-08-15 NOTE — Telephone Encounter (Signed)
Pt contacted office for refill request on the following medications:  lisinopril (PRINIVIL,ZESTRIL) 40 MG tablet   [facility] [address]  VG#[phone]/[place]

## 2017-08-15 NOTE — Telephone Encounter (Signed)
-----   Message from Trinna Post, [place] sent at 08/15/2017 11:55 AM EST ----- Triglcyerides elevated, not indicating statin treatment. Please reduce saturated fat intake, alcohol intake. Metabolic panel shows glucose in diabetic range. We may be able to add A1C to get more accurate measure if okay with patient. If not, we can check at next visit.

## 2017-08-15 NOTE — Telephone Encounter (Signed)
Pt advised. He is okay with adding A1C.  Thanks,   -Mickel Baas

## 2017-08-15 NOTE — Telephone Encounter (Signed)
Can we please try to add A1C? There may have been too much time, if not we will cover at next visit.

## 2017-08-22 ENCOUNTER — Telehealth: Payer: Self-pay | Admitting: Physician Assistant

## 2017-08-22 NOTE — Telephone Encounter (Signed)
Pt requesting lab results.  States he was told he would get a call back in a day or two and has not heard anything and his appt was 11/21.

## 2017-08-22 NOTE — Telephone Encounter (Signed)
Pt advised he will need to come in for an A1C.  He said he forgot to mention about an hour before his blood work he ate  " six Peppermint Candies" and had two chocolate milks at 2am the night before.  I told him his glucose was likely normal, and we could wait until next year to recheck.  He states he wanted to check his A1C anyway to be on the safe side. Made apt for 11/14/2016 at 12pm.   Thanks,   -Mickel Baas

## 2017-09-28 ENCOUNTER — Other Ambulatory Visit: Payer: Self-pay | Admitting: Physician Assistant

## 2017-09-28 DIAGNOSIS — M792 Neuralgia and neuritis, unspecified: Secondary | ICD-10-CM

## 2017-09-28 NOTE — Telephone Encounter (Signed)
[facility] faxed refill request for the following medications:  gabapentin (NEURONTIN) 300 MG capsule   Last Rx: 03/29/17 by Dr. Parks Ranger LOV: 08/09/17 Please advise. Thanks TNP

## 2017-09-29 MED ORDER — GABAPENTIN 300 MG PO CAPS: ORAL_CAPSULE | ORAL | 5 refills | Status: DC

## 2017-10-13 ENCOUNTER — Other Ambulatory Visit: Payer: Self-pay | Admitting: Physician Assistant

## 2017-11-14 ENCOUNTER — Ambulatory Visit: Payer: BC Managed Care – PPO | Admitting: Physician Assistant

## 2017-11-15 ENCOUNTER — Encounter: Payer: Self-pay | Admitting: Physician Assistant

## 2017-11-15 ENCOUNTER — Ambulatory Visit: Payer: BC Managed Care – PPO | Admitting: Physician Assistant

## 2017-11-15 VITALS — BP 138/86 | HR 80 | Temp 98.8°F | Resp 16 | Wt 225.0 lb

## 2017-11-15 DIAGNOSIS — R7303 Prediabetes: Secondary | ICD-10-CM | POA: Diagnosis not present

## 2017-11-15 LAB — POCT GLYCOSYLATED HEMOGLOBIN (HGB A1C): Hemoglobin A1C: 5.7

## 2017-11-15 NOTE — Progress Notes (Signed)
Patient: Stephen Ray Male    DOB: March 07, 1966   52 y.o.   MRN: [id number] Visit Date: 11/15/2017  Today's Provider: Trinna Post, PA-C   Chief Complaint  Patient presents with  . Hypertension  . Hyperglycemia   Subjective:    Patient is a 52 y/o man with history of HTN who presents today for follow up of hyperglycemia. He needs an A1C. Denies urinary frequency, excessive thirst or hunger. Denies hypoglycemia episodes or history of diabetes. He was recently drinking two 16 ounce bottles of pepsi daily until several days ago. He has switched to diet sodas and plans to cut this out completely.  He walks 5 miles every other day but since it has been raining he is using the elliptical.   Hypertension  This is a chronic problem. The problem is controlled. Pertinent negatives include no anxiety, blurred vision, chest pain, headaches, malaise/fatigue, neck pain, orthopnea or palpitations. There are no associated agents to hypertension. Past treatments include ACE inhibitors and calcium channel blockers. There are no compliance problems.        Allergies  Allergen Reactions  . Shellfish Allergy Anaphylaxis  . Shellfish-Derived Products Anaphylaxis     Current Outpatient Medications:  .  amLODipine (NORVASC) 5 MG tablet, Take 1 tablet (5 mg total) by mouth daily., Disp: 90 tablet, Rfl: 1 .  DULoxetine (CYMBALTA) 30 MG capsule, Take 1 capsule (30 mg total) by mouth daily., Disp: 90 capsule, Rfl: 1 .  gabapentin (NEURONTIN) 300 MG capsule, Take 1 tablet twice a day and 2 at bedtime., Disp: 120 capsule, Rfl: 5 .  ibuprofen (ADVIL,MOTRIN) 200 MG tablet, Take 600 mg by mouth daily as needed for moderate pain., Disp: , Rfl:  .  lisinopril (PRINIVIL,ZESTRIL) 40 MG tablet, TAKE 1 TABLET BY MOUTH ONCE DAILY, Disp: 90 tablet, Rfl: 1  Current Facility-Administered Medications:  .  betamethasone acetate-betamethasone sodium phosphate (CELESTONE) injection 3 mg, 3 mg, Intramuscular,  Once, Evans, Brent M, DPM .  betamethasone acetate-betamethasone sodium phosphate (CELESTONE) injection 3 mg, 3 mg, Intramuscular, Once, Edrick Kins, DPM  Review of Systems  Constitutional: Negative.  Negative for malaise/fatigue.  Eyes: Negative for blurred vision.  Respiratory: Negative.   Cardiovascular: Negative for chest pain, palpitations and orthopnea.  Gastrointestinal: Negative.   Musculoskeletal: Negative for neck pain.  Neurological: Negative for dizziness, light-headedness and headaches.    Social History   Tobacco Use  . Smoking status: Former Research scientist (life sciences)  . Smokeless tobacco: Never Used  . Tobacco comment: quit 18 yrs ago  Substance Use Topics  . Alcohol use: Yes    Alcohol/week: 2.4 oz    Types: 4 Shots of liquor per week    Comment: Occasionally    Objective:   BP 138/86 (BP Location: Right Arm, Patient Position: Sitting, Cuff Size: Large)   Pulse 80   Temp 98.8 F (37.1 C) (Oral)   Resp 16   Wt 225 lb (102.1 kg)   BMI 32.28 kg/m  Vitals:   11/15/17 1555  BP: 138/86  Pulse: 80  Resp: 16  Temp: 98.8 F (37.1 C)  TempSrc: Oral  Weight: 225 lb (102.1 kg)     Physical Exam  Constitutional: He is oriented to person, place, and time. He appears well-developed and well-nourished.  Cardiovascular: Normal rate and regular rhythm.  Pulmonary/Chest: Effort normal and breath sounds normal.  Neurological: He is alert and oriented to person, place, and time.  Skin: Skin is warm and dry.  Psychiatric: He has a normal mood and affect. His behavior is normal.        Assessment & Plan:     1. Prediabetes  A1c is 5.7% and shows prediabetes. Counseled on importance of sugar reduction and patient plans to eliminate sodas. Counseled on possible progression into diabetes if not addresses. Encouraged to keep up his exercise regimen.   - POCT glycosylated hemoglobin (Hb A1C)  Return in about 3 months (around 02/12/2018), or prediabetes.  The entirety of the  information documented in the History of Present Illness, Review of Systems and Physical Exam were personally obtained by me. Portions of this information were initially documented by Ashley Royalty, CMA and reviewed by me for thoroughness and accuracy.        Trinna Post, PA-C  [facility]

## 2017-11-15 NOTE — Patient Instructions (Signed)
Prediabetes Eating Plan Prediabetes-also called impaired glucose tolerance or impaired fasting glucose-is a condition that causes blood sugar (blood glucose) levels to be higher than normal. Following a healthy diet can help to keep prediabetes under control. It can also help to lower the risk of type 2 diabetes and heart disease, which are increased in people who have prediabetes. Along with regular exercise, a healthy diet:  Promotes weight loss.  Helps to control blood sugar levels.  Helps to improve the way that the body uses insulin.  What do I need to know about this eating plan?  Use the glycemic index (GI) to plan your meals. The index tells you how quickly a food will raise your blood sugar. Choose low-GI foods. These foods take a longer time to raise blood sugar.  Pay close attention to the amount of carbohydrates in the food that you eat. Carbohydrates increase blood sugar levels.  Keep track of how many calories you take in. Eating the right amount of calories will help you to achieve a healthy weight. Losing about 7 percent of your starting weight can help to prevent type 2 diabetes.  You may want to follow a Mediterranean diet. This diet includes a lot of vegetables, lean meats or fish, whole grains, fruits, and healthy oils and fats. What foods can I eat? Grains Whole grains, such as whole-wheat or whole-grain breads, crackers, cereals, and pasta. Unsweetened oatmeal. Bulgur. Barley. Quinoa. Brown rice. Corn or whole-wheat flour tortillas or taco shells. Vegetables Lettuce. Spinach. Peas. Beets. Cauliflower. Cabbage. Broccoli. Carrots. Tomatoes. Squash. Eggplant. Herbs. Peppers. Onions. Cucumbers. Brussels sprouts. Fruits Berries. Bananas. Apples. Oranges. Grapes. Papaya. Mango. Pomegranate. Kiwi. Grapefruit. Cherries. Meats and Other Protein Sources Seafood. Lean meats, such as chicken and turkey or lean cuts of pork and beef. Tofu. Eggs. Nuts. Beans. Dairy Low-fat or  fat-free dairy products, such as yogurt, cottage cheese, and cheese. Beverages Water. Tea. Coffee. Sugar-free or diet soda. Seltzer water. Milk. Milk alternatives, such as soy or almond milk. Condiments Mustard. Relish. Low-fat, low-sugar ketchup. Low-fat, low-sugar barbecue sauce. Low-fat or fat-free mayonnaise. Sweets and Desserts Sugar-free or low-fat pudding. Sugar-free or low-fat ice cream and other frozen treats. Fats and Oils Avocado. Walnuts. Olive oil. The items listed above may not be a complete list of recommended foods or beverages. Contact your dietitian for more options. What foods are not recommended? Grains Refined white flour and flour products, such as bread, pasta, snack foods, and cereals. Beverages Sweetened drinks, such as sweet iced tea and soda. Sweets and Desserts Baked goods, such as cake, cupcakes, pastries, cookies, and cheesecake. The items listed above may not be a complete list of foods and beverages to avoid. Contact your dietitian for more information. This information is not intended to replace advice given to you by your health care provider. Make sure you discuss any questions you have with your health care provider. Document Released: 01/20/2015 Document Revised: 02/11/2016 Document Reviewed: 10/01/2014 Elsevier Interactive Patient Education  2017 Elsevier Inc.  

## 2018-01-30 ENCOUNTER — Other Ambulatory Visit: Payer: Self-pay | Admitting: Physician Assistant

## 2018-01-30 DIAGNOSIS — F419 Anxiety disorder, unspecified: Secondary | ICD-10-CM

## 2018-02-08 ENCOUNTER — Ambulatory Visit: Payer: BC Managed Care – PPO

## 2018-02-23 ENCOUNTER — Ambulatory Visit: Payer: BC Managed Care – PPO | Admitting: Physician Assistant

## 2018-02-23 ENCOUNTER — Encounter: Payer: Self-pay | Admitting: Physician Assistant

## 2018-02-23 VITALS — BP 142/88 | HR 60 | Temp 98.3°F | Resp 16 | Wt 219.0 lb

## 2018-02-23 DIAGNOSIS — R739 Hyperglycemia, unspecified: Secondary | ICD-10-CM | POA: Diagnosis not present

## 2018-02-23 DIAGNOSIS — I10 Essential (primary) hypertension: Secondary | ICD-10-CM | POA: Diagnosis not present

## 2018-02-23 LAB — POCT GLYCOSYLATED HEMOGLOBIN (HGB A1C): Hemoglobin A1C: 5.4 % (ref 4.0–5.6)

## 2018-02-23 MED ORDER — AMLODIPINE BESYLATE 10 MG PO TABS: 10.0000 mg | ORAL_TABLET | Freq: Every day | ORAL | 0 refills | Status: DC

## 2018-02-23 NOTE — Patient Instructions (Signed)
Prediabetes Eating Plan Prediabetes-also called impaired glucose tolerance or impaired fasting glucose-is a condition that causes blood sugar (blood glucose) levels to be higher than normal. Following a healthy diet can help to keep prediabetes under control. It can also help to lower the risk of type 2 diabetes and heart disease, which are increased in people who have prediabetes. Along with regular exercise, a healthy diet:  Promotes weight loss.  Helps to control blood sugar levels.  Helps to improve the way that the body uses insulin.  What do I need to know about this eating plan?  Use the glycemic index (GI) to plan your meals. The index tells you how quickly a food will raise your blood sugar. Choose low-GI foods. These foods take a longer time to raise blood sugar.  Pay close attention to the amount of carbohydrates in the food that you eat. Carbohydrates increase blood sugar levels.  Keep track of how many calories you take in. Eating the right amount of calories will help you to achieve a healthy weight. Losing about 7 percent of your starting weight can help to prevent type 2 diabetes.  You may want to follow a Mediterranean diet. This diet includes a lot of vegetables, lean meats or fish, whole grains, fruits, and healthy oils and fats. What foods can I eat? Grains Whole grains, such as whole-wheat or whole-grain breads, crackers, cereals, and pasta. Unsweetened oatmeal. Bulgur. Barley. Quinoa. Brown rice. Corn or whole-wheat flour tortillas or taco shells. Vegetables Lettuce. Spinach. Peas. Beets. Cauliflower. Cabbage. Broccoli. Carrots. Tomatoes. Squash. Eggplant. Herbs. Peppers. Onions. Cucumbers. Brussels sprouts. Fruits Berries. Bananas. Apples. Oranges. Grapes. Papaya. Mango. Pomegranate. Kiwi. Grapefruit. Cherries. Meats and Other Protein Sources Seafood. Lean meats, such as chicken and turkey or lean cuts of pork and beef. Tofu. Eggs. Nuts. Beans. Dairy Low-fat or  fat-free dairy products, such as yogurt, cottage cheese, and cheese. Beverages Water. Tea. Coffee. Sugar-free or diet soda. Seltzer water. Milk. Milk alternatives, such as soy or almond milk. Condiments Mustard. Relish. Low-fat, low-sugar ketchup. Low-fat, low-sugar barbecue sauce. Low-fat or fat-free mayonnaise. Sweets and Desserts Sugar-free or low-fat pudding. Sugar-free or low-fat ice cream and other frozen treats. Fats and Oils Avocado. Walnuts. Olive oil. The items listed above may not be a complete list of recommended foods or beverages. Contact your dietitian for more options. What foods are not recommended? Grains Refined white flour and flour products, such as bread, pasta, snack foods, and cereals. Beverages Sweetened drinks, such as sweet iced tea and soda. Sweets and Desserts Baked goods, such as cake, cupcakes, pastries, cookies, and cheesecake. The items listed above may not be a complete list of foods and beverages to avoid. Contact your dietitian for more information. This information is not intended to replace advice given to you by your health care provider. Make sure you discuss any questions you have with your health care provider. Document Released: 01/20/2015 Document Revised: 02/11/2016 Document Reviewed: 10/01/2014 Elsevier Interactive Patient Education  2017 Elsevier Inc.  

## 2018-02-23 NOTE — Progress Notes (Signed)
Patient: Stephen Ray Male    DOB: 06-04-1966   52 y.o.   MRN: [id number] Visit Date: 02/23/2018  Today's Provider: Trinna Post, PA-C   Chief Complaint  Patient presents with  . Hyperglycemia    Three month follow up.    Subjective:    Hyperglycemia  This is a new problem. The problem occurs constantly. Pertinent negatives include no abdominal pain, anorexia, arthralgias, chest pain, chills, congestion, coughing, diaphoresis, fatigue, fever, headaches, joint swelling, myalgias, nausea, neck pain, numbness, rash, sore throat, swollen glands, urinary symptoms, vertigo, visual change, vomiting or weakness.   HTN: Elevated today. Patient reports it typically runs high outside when he checks it. Currently on Lisinopril 40 mg and amlodipine 5 mg daily.    Allergies  Allergen Reactions  . Shellfish Allergy Anaphylaxis  . Shellfish-Derived Products Anaphylaxis     Current Outpatient Medications:  .  DULoxetine (CYMBALTA) 30 MG capsule, TAKE 1 CAPSULE BY MOUTH ONCE DAILY, Disp: 90 capsule, Rfl: 1 .  gabapentin (NEURONTIN) 300 MG capsule, Take 1 tablet twice a day and 2 at bedtime., Disp: 120 capsule, Rfl: 5 .  ibuprofen (ADVIL,MOTRIN) 200 MG tablet, Take 600 mg by mouth daily as needed for moderate pain., Disp: , Rfl:  .  lisinopril (PRINIVIL,ZESTRIL) 40 MG tablet, TAKE 1 TABLET BY MOUTH ONCE DAILY, Disp: 90 tablet, Rfl: 1 .  amLODipine (NORVASC) 10 MG tablet, Take 1 tablet (10 mg total) by mouth daily., Disp: 90 tablet, Rfl: 0  Current Facility-Administered Medications:  .  betamethasone acetate-betamethasone sodium phosphate (CELESTONE) injection 3 mg, 3 mg, Intramuscular, Once, Evans, Brent M, DPM .  betamethasone acetate-betamethasone sodium phosphate (CELESTONE) injection 3 mg, 3 mg, Intramuscular, Once, Edrick Kins, DPM  Review of Systems  Constitutional: Negative for chills, diaphoresis, fatigue and fever.  HENT: Negative for congestion and sore throat.     Respiratory: Negative for cough.   Cardiovascular: Negative for chest pain.  Gastrointestinal: Negative for abdominal pain, anorexia, nausea and vomiting.  Musculoskeletal: Negative for arthralgias, joint swelling, myalgias and neck pain.  Skin: Negative for rash.  Neurological: Negative for vertigo, weakness, numbness and headaches.    Social History   Tobacco Use  . Smoking status: Former Research scientist (life sciences)  . Smokeless tobacco: Never Used  . Tobacco comment: quit 18 yrs ago  Substance Use Topics  . Alcohol use: Yes    Alcohol/week: 2.4 oz    Types: 4 Shots of liquor per week    Comment: Occasionally    Objective:   BP (!) 142/88 (BP Location: Right Arm, Patient Position: Sitting, Cuff Size: Normal)   Pulse 60   Temp 98.3 F (36.8 C) (Oral)   Resp 16   Wt 219 lb (99.3 kg)   BMI 31.42 kg/m  Vitals:   02/23/18 1605  BP: (!) 142/88  Pulse: 60  Resp: 16  Temp: 98.3 F (36.8 C)  TempSrc: Oral  Weight: 219 lb (99.3 kg)     Physical Exam  Constitutional: He is oriented to person, place, and time. He appears well-developed and well-nourished.  Cardiovascular: Normal rate.  Pulmonary/Chest: Effort normal and breath sounds normal.  Neurological: He is alert and oriented to person, place, and time.  Psychiatric: He has a normal mood and affect. His behavior is normal.        Assessment & Plan:     1. Hyperglycemia  A1c 5.4% today, down from 5.7%. Patient has eliminated sodas and lost 6 lbs from last  visit.   - POCT glycosylated hemoglobin (Hb A1C)  2. Essential (primary) hypertension  Increase amlodipine from 5 mg to 10 mg QD.  - amLODipine (NORVASC) 10 MG tablet; Take 1 tablet (10 mg total) by mouth daily.  Dispense: 90 tablet; Refill: 0  Return in about 3 months (around 05/26/2018) for HTN.  The entirety of the information documented in the History of Present Illness, Review of Systems and Physical Exam were personally obtained by me. Portions of this information were  initially documented by Ashley Royalty, CMA and reviewed by me for thoroughness and accuracy.        Trinna Post, PA-C  [facility]

## 2018-04-16 ENCOUNTER — Ambulatory Visit: Payer: BC Managed Care – PPO | Admitting: Family Medicine

## 2018-04-16 ENCOUNTER — Other Ambulatory Visit: Payer: Self-pay | Admitting: Physician Assistant

## 2018-04-16 ENCOUNTER — Encounter: Payer: Self-pay | Admitting: Family Medicine

## 2018-04-16 VITALS — BP 140/90 | HR 76 | Temp 98.1°F | Resp 15 | Wt 215.0 lb

## 2018-04-16 DIAGNOSIS — M792 Neuralgia and neuritis, unspecified: Secondary | ICD-10-CM

## 2018-04-16 DIAGNOSIS — R2241 Localized swelling, mass and lump, right lower limb: Secondary | ICD-10-CM | POA: Diagnosis not present

## 2018-04-16 NOTE — Patient Instructions (Signed)
We will call about the surgery referral. Consider Tylenol up to 3000 mg./day for pain in your shoulders.

## 2018-04-16 NOTE — Progress Notes (Signed)
  Subjective:     Patient ID: Stephen Ray, male   DOB: 1965/12/15, 52 y.o.   MRN: [id number] Chief Complaint  Patient presents with  . Groin Pain    Patient comes in office today with complaints of right side groin pain for the past 5 months, patient has concerns that he may have a posisble hernia. Patient states that he has noticed swelling on his right thigh and states that it is painful at times when walking.    HPI States he works at [employer] and has to walk a lot in his job. Denies specific injury or change in bowel or bladder habits. Reports hx of lipomas with removal of a large lipoma in the past in his right axillary area.  Review of Systems     Objective:   Physical Exam  Constitutional: He appears well-developed and well-nourished. No distress.  Skin:  Right inner thigh with subtle area of swelling c/w lipoma with mild tenderness on deep palpation. No overlying erythema or underlying induration.       Assessment:    1. Mass of thigh, right: suspect lipoma - Ambulatory referral to General Surgery    Plan:    Further f/u as needed.

## 2018-04-17 ENCOUNTER — Telehealth: Payer: Self-pay | Admitting: Physician Assistant

## 2018-04-17 ENCOUNTER — Other Ambulatory Visit: Payer: Self-pay | Admitting: Family Medicine

## 2018-04-17 NOTE — Telephone Encounter (Signed)
Please review and revise referral/ KW

## 2018-04-17 NOTE — Telephone Encounter (Signed)
Pt seen Mikki Santee yesterday for a possible hernia. Mikki Santee referred him to [facility] called him today saying they could not get him in until August 29th.  He states he is in pain and limping and his work will not let him come back until this is resolved.  He said  Surgical told him if you called them saying it was urgent they could try to get him in sooner.  Pt's call back is [phone]  Thanks Con Memos

## 2018-04-18 ENCOUNTER — Other Ambulatory Visit: Payer: Self-pay | Admitting: Family Medicine

## 2018-04-18 DIAGNOSIS — R2241 Localized swelling, mass and lump, right lower limb: Secondary | ICD-10-CM

## 2018-04-18 NOTE — Telephone Encounter (Signed)
Referral upgraded.

## 2018-04-24 ENCOUNTER — Ambulatory Visit: Payer: BC Managed Care – PPO | Admitting: Surgery

## 2018-04-24 ENCOUNTER — Encounter: Payer: Self-pay | Admitting: Surgery

## 2018-04-24 ENCOUNTER — Telehealth: Payer: Self-pay | Admitting: Surgery

## 2018-04-24 VITALS — BP 164/89 | HR 79 | Temp 97.7°F | Ht 70.0 in | Wt 221.0 lb

## 2018-04-24 DIAGNOSIS — K6289 Other specified diseases of anus and rectum: Secondary | ICD-10-CM

## 2018-04-24 DIAGNOSIS — R1031 Right lower quadrant pain: Secondary | ICD-10-CM | POA: Diagnosis not present

## 2018-04-24 NOTE — Progress Notes (Signed)
Surgical Clinic History and Physical  Referring provider:  Trinna Post, PA-C [address] [place], [place] [address]  HISTORY OF PRESENT ILLNESS (HPI):  52 y.o. male presents for evaluation of Right medial thigh lipoma. Patient reports Right groin pain x several weeks that he initially thought could represent a hernia until he, his wife, and his family medical PA palpated a Right groin subcutaneous mass believed to be a lipoma. However, this mass and Right groin pain have both resolved completely over the past 2 weeks, while peri-anal pain he attributed to a hemorrhoid also significantly improved without complete resolution. Accordingly, patient also inquires whether a hemorrhoid could have been the source of his discomfort all along. However, patient denies any palpable hemorrhoid, burning, itching, or prolapsing bulge otherwise. He describes his former pain as a sharp burning sensation. Patient also denies any constipation or diarrhea. He likewise denies any blood with BM's, but describes he is too scared to look for blood in the toilet. Of note, patient says he recently, over the same period during which his symptoms began, started working at [employer], which has required him to walk many more steps than he had previously, though he doesn't typically lift much more as a result of his new job, and his symptoms have all improved during his 1 - 2 weeks off.  PAST MEDICAL HISTORY (PMH):  Past Medical History:  Diagnosis Date  . Anxiety   . Arthritis    shoulders (before replacements)  . Asthma    as child  . Hypertension   . Kidney stones   . Motion sickness    deep sea fishing  . Shoulder pain    left and right     PAST SURGICAL HISTORY [facility]):  Past Surgical History:  Procedure Laterality Date  . CERVICAL FUSION  2001  . COLONOSCOPY WITH PROPOFOL N/A 03/31/2016   Procedure: COLONOSCOPY WITH PROPOFOL;  Surgeon: Lucilla Lame, MD;  Location: [facility];  Service:  Endoscopy;  Laterality: N/A;  . HERNIA REPAIR  2005  . LIPOMA EXCISION Right 2012   under arm  . LITHOTRIPSY  2002  . POLYPECTOMY  03/31/2016   Procedure: POLYPECTOMY;  Surgeon: Lucilla Lame, MD;  Location: [facility];  Service: Endoscopy;;  . SHOULDER HEMI-ARTHROPLASTY Left 08/20/2015   Procedure: SHOULDER HEMI-ARTHROPLASTY;  Surgeon: Corky Mull, MD;  Location: [facility];  Service: Orthopedics;  Laterality: Left;  . SHOULDER SURGERY Right 12/25/2014  . TOTAL SHOULDER ARTHROPLASTY Right      MEDICATIONS:  Prior to Admission medications   Medication Sig Start Date End Date Taking? Authorizing Provider  amLODipine (NORVASC) 10 MG tablet Take 1 tablet (10 mg total) by mouth daily. 02/23/18  Yes Trinna Post, PA-C  DULoxetine (CYMBALTA) 30 MG capsule TAKE 1 CAPSULE BY MOUTH ONCE DAILY 01/30/18  Yes Carles Collet M, PA-C  gabapentin (NEURONTIN) 300 MG capsule TAKE 1 CAPSULE BY MOUTH TWICE DAILY AND 2 AT BEDTIME 04/17/18  Yes Pollak, Adriana M, PA-C  ibuprofen (ADVIL,MOTRIN) 200 MG tablet Take 600 mg by mouth daily as needed for moderate pain.   Yes [provider]  lisinopril (PRINIVIL,ZESTRIL) 40 MG tablet TAKE 1 TABLET BY MOUTH ONCE DAILY 04/17/18  Yes Trinna Post, PA-C     ALLERGIES:  Allergies  Allergen Reactions  . Shellfish Allergy Anaphylaxis  . Shellfish-Derived Products Anaphylaxis     SOCIAL HISTORY:  Social History   Socioeconomic History  . Marital status: Married    Spouse name:  Not on file  . Number of children: Not on file  . Years of education: Not on file  . Highest education level: Not on file  Occupational History  . Occupation: Museum/gallery curator / Architect (Self employed)  Social Needs  . Financial resource strain: Not on file  . Food insecurity:    Worry: Not on file    Inability: Not on file  . Transportation needs:    Medical: Not on file    Non-medical: Not on file  Tobacco Use  . Smoking status: Former Research scientist (life sciences)  . Smokeless tobacco:  Never Used  . Tobacco comment: quit 18 yrs ago  Substance and Sexual Activity  . Alcohol use: Yes    Alcohol/week: 2.4 oz    Types: 4 Shots of liquor per week    Comment: Occasionally   . Drug use: No  . Sexual activity: Not on file  Lifestyle  . Physical activity:    Days per week: Not on file    Minutes per session: Not on file  . Stress: Not on file  Relationships  . Social connections:    Talks on phone: Not on file    Gets together: Not on file    Attends religious service: Not on file    Active member of club or organization: Not on file    Attends meetings of clubs or organizations: Not on file    Relationship status: Not on file  . Intimate partner violence:    Fear of current or ex partner: Not on file    Emotionally abused: Not on file    Physically abused: Not on file    Forced sexual activity: Not on file  Other Topics Concern  . Not on file  Social History Narrative  . Not on file    The patient currently resides (home / rehab facility / nursing home): Home The patient normally is (ambulatory / bedbound): Ambulatory  FAMILY HISTORY:  Family History  Problem Relation Age of Onset  . Nephrolithiasis Father   . Cancer Father        Leukemia  . Kidney cancer Neg Hx   . Prostate cancer Neg Hx     Otherwise negative/non-contributory.  REVIEW OF SYSTEMS:  Constitutional: denies any other weight loss, fever, chills, or sweats  Eyes: denies any other vision changes, history of eye injury  ENT: denies sore throat, hearing problems  Respiratory: denies shortness of breath, wheezing  Cardiovascular: denies chest pain, palpitations  Gastrointestinal: abdominal pain, N/V, and bowel function as per HPI Musculoskeletal: denies any other joint pains or cramps  Skin: Denies any other rashes or skin discolorations Neurological: denies any other headache, dizziness, weakness  Psychiatric: Denies any other depression, anxiety   All other review of systems were  otherwise negative   VITAL SIGNS:  BP (!) 164/89   Pulse 79   Temp 97.7 F (36.5 C) (Oral)   Ht 5\' 10"  (1.778 m)   Wt 221 lb (100.2 kg)   BMI 31.71 kg/m   PHYSICAL EXAM:  Constitutional:  -- Normal body habitus  -- Awake, alert, and oriented x3  Eyes:  -- Pupils equally round and reactive to light  -- No scleral icterus  Ear, nose, throat:  -- No jugular venous distension -- No nasal drainage, bleeding Pulmonary:  -- No crackles  -- Equal breath sounds bilaterally -- Breathing non-labored at rest Cardiovascular:  -- S1, S2 present  -- No pericardial rubs  Gastrointestinal:  -- Abdomen soft, nontender, non-distended,  no guarding/rebound  -- No abdominal masses appreciated, pulsatile or otherwise -- Specifically B/L no palpable inguinal hernias Anorectal: -- No prolapsed hemorrhoid observed, no anal fissure or fistula -- Anal sphincter tone somewhat increased during exam without gross blood Musculoskeletal and Integumentary:  -- Wounds or skin discoloration: No Right medial thigh subcutaneous mass appreciated by self or patient -- Extremities: B/L UE and LE FROM, hands and feet warm, no edema  Neurologic:  -- Motor function: Intact and symmetric -- Sensation: Intact and symmetric  Labs:  CBC:  Lab Results  Component Value Date   WBC 6.1 06/02/2016   RBC 4.56 06/02/2016   BMP:  Lab Results  Component Value Date   GLUCOSE 128 (H) 08/09/2017   GLUCOSE 139 (H) 12/26/2014   CO2 30 08/09/2017   CO2 29 12/26/2014   BUN 10 08/09/2017   BUN 16 12/26/2014   CREATININE 0.92 08/09/2017   CALCIUM 9.3 08/09/2017   CALCIUM 8.3 (L) 12/26/2014     Imaging studies: No new pertinent imaging studies available for review   Assessment/Plan:  52 y.o. male with no evidence of Right medial thigh subcutaneous mass, B/L inguinal hernias, or prolapsed hemorrhoids to explain patient's now-improved/nearly-resolved symptoms, complicated by co-morbidities including HTN,  nephrolithiasis, osteoarthritis, and generalized anxiety disorder.   - no indication for surgical intervention at this time   - maintain hydration with high fiber diet encouraged to minimize constipation  - signs and symptoms of hemorrhoidal disease and incarceration/obstruction due to hernias discussed  - return to clinic as needed, instructed to call if any questions or concerns  All of the above recommendations were discussed with the patient, and all of patient's questions were answered to his expressed satisfaction.  Thank you for the opportunity to participate in this patient's care.  -- Marilynne Drivers Rosana Hoes, MD, [place]: [facility] - Partnering for exceptional care. Office: [phone]

## 2018-04-24 NOTE — Telephone Encounter (Signed)
Patient has called and left a message on voicemail. He stated that he had a question that he forgot to ask while he was in the office today with Dr Rosana Hoes. Patient did not specify the question. Please call back at number verified in the chart.

## 2018-04-24 NOTE — Telephone Encounter (Signed)
Spoke with patient. He wanted to know if it could be a herniated disc. I suggested following up with his PCP.

## 2018-04-24 NOTE — Patient Instructions (Signed)
Please call our office if you have questions or concerns.   

## 2018-04-25 ENCOUNTER — Encounter: Payer: Self-pay | Admitting: Family Medicine

## 2018-04-25 ENCOUNTER — Ambulatory Visit: Payer: BC Managed Care – PPO | Admitting: Family Medicine

## 2018-04-25 VITALS — BP 142/102 | HR 72 | Temp 98.4°F | Resp 16 | Wt 210.8 lb

## 2018-04-25 DIAGNOSIS — M5441 Lumbago with sciatica, right side: Secondary | ICD-10-CM

## 2018-04-25 MED ORDER — PREDNISONE 20 MG PO TABS: ORAL_TABLET | ORAL | 0 refills | Status: DC

## 2018-04-25 NOTE — Patient Instructions (Signed)
Let me know if not improving for referral to orthopedics.

## 2018-04-25 NOTE — Progress Notes (Signed)
  Subjective:     Patient ID: SHAHIN KNIERIM, male   DOB: [birth year], 52 y.o.   MRN: [id number] Chief Complaint  Patient presents with  . Back Pain    Patient comes in office today with complaints of lower back pain, patient states that he was seen a week with same complaint and states that now its shooting pain down right thigh and numbness.    HPI Reports chronic low level back pain for which he has received epidurals injections remotely. Concerned that his recent job at [employer] where he has to walk on concrete and lift exacerbated his sx. Has previously seen Dr. Rayvon Rack for shoulder surgery.  Review of Systems     Objective:   Physical Exam  Constitutional: He appears well-developed and well-nourished. He appears distressed ( mild discomfort when changing positions).  Musculoskeletal:  Muscle strength in lower extremities 5/5. SLR's to 90 degrees without radiation of back pain. Mild tenderness @ right sacral paravertebral area.       Assessment:    1. Acute right-sided low back pain with right-sided sciatica - predniSONE (DELTASONE) 20 MG tablet; Taper as follows: 3 pills for 4 days, two pills for 4 days, one pill for four days  Dispense: 24 tablet; Refill: 0    Plan:    Will call for referral if prednisone not helping.

## 2018-04-30 ENCOUNTER — Telehealth: Payer: Self-pay | Admitting: Physician Assistant

## 2018-04-30 ENCOUNTER — Other Ambulatory Visit: Payer: Self-pay | Admitting: Family Medicine

## 2018-04-30 DIAGNOSIS — M5441 Lumbago with sciatica, right side: Secondary | ICD-10-CM

## 2018-04-30 NOTE — Telephone Encounter (Signed)
Back x-ray ordered at [facility] outpatient imaging.

## 2018-04-30 NOTE — Telephone Encounter (Signed)
Please review chart and advise. KW 

## 2018-04-30 NOTE — Telephone Encounter (Signed)
Patient advised.

## 2018-04-30 NOTE — Telephone Encounter (Signed)
Was in last week for back pain.   He has been taking to prednisone but he is still having back pain.  He wants to know if you want to schd an xray.  pts CB #3  [phone]  Thanks Con Memos

## 2018-04-30 NOTE — Telephone Encounter (Signed)
[id number] 

## 2018-05-01 ENCOUNTER — Ambulatory Visit
Admission: RE | Admit: 2018-05-01 | Discharge: 2018-05-01 | Disposition: A | Discharge status: Home / Self Care | Payer: BC Managed Care – PPO | Source: Ambulatory Visit | Attending: Family Medicine | Admitting: Family Medicine

## 2018-05-01 DIAGNOSIS — N200 Calculus of kidney: Secondary | ICD-10-CM | POA: Diagnosis not present

## 2018-05-01 DIAGNOSIS — M5137 Other intervertebral disc degeneration, lumbosacral region: Secondary | ICD-10-CM | POA: Insufficient documentation

## 2018-05-01 DIAGNOSIS — M5441 Lumbago with sciatica, right side: Secondary | ICD-10-CM | POA: Insufficient documentation

## 2018-05-02 ENCOUNTER — Telehealth: Payer: Self-pay

## 2018-05-02 ENCOUNTER — Encounter: Payer: Self-pay | Admitting: Family Medicine

## 2018-05-02 ENCOUNTER — Other Ambulatory Visit: Payer: Self-pay | Admitting: Family Medicine

## 2018-05-02 DIAGNOSIS — M5441 Lumbago with sciatica, right side: Secondary | ICD-10-CM

## 2018-05-02 MED ORDER — LIDOCAINE 5 % EX PTCH: 1.0000 | MEDICATED_PATCH | CUTANEOUS | 0 refills | Status: DC

## 2018-05-02 NOTE — Telephone Encounter (Signed)
I have sent the Lidoderm patches in. Would suggest he also be referred to orthopedics. Does he wish to proceed?

## 2018-05-02 NOTE — Telephone Encounter (Signed)
Patient states he was advised and responded to you in my chart, he states that he forgot to ask you to send in a prescription for lidocaine patches in to [facility] on [address]? Patient states that he is very concerned because the prednisone has not helped with symptoms and has been taking tylenol with no relief. KW

## 2018-05-02 NOTE — Telephone Encounter (Signed)
-----   Message from Carmon Ginsberg, [place] sent at 05/02/2018  7:25 AM EDT ----- Mild arthritic changes on x-ray. Are you improving on the prednisone? Do you wish referral to orthopedics?

## 2018-05-02 NOTE — Telephone Encounter (Signed)
Yes patient did want to proceed with referral. Stephen Ray

## 2018-05-07 ENCOUNTER — Telehealth: Payer: Self-pay | Admitting: Family Medicine

## 2018-05-07 NOTE — Telephone Encounter (Signed)
[facility] called stating they need prior authorization for Lidoderm patches.   Please call [phone] to get prior auth.   ID-  [id number]. Please do ASAP as patient is in pain.

## 2018-05-07 NOTE — Telephone Encounter (Signed)
Pre-Authorizaton sent electronically through [facility] ( Key# [id number]) status-pending. KW

## 2018-05-11 DIAGNOSIS — M47816 Spondylosis without myelopathy or radiculopathy, lumbar region: Secondary | ICD-10-CM | POA: Insufficient documentation

## 2018-05-11 DIAGNOSIS — S39012A Strain of muscle, fascia and tendon of lower back, initial encounter: Secondary | ICD-10-CM | POA: Insufficient documentation

## 2018-05-17 ENCOUNTER — Ambulatory Visit: Payer: BC Managed Care – PPO | Admitting: General Surgery

## 2018-06-02 ENCOUNTER — Other Ambulatory Visit: Payer: Self-pay | Admitting: Physician Assistant

## 2018-06-02 DIAGNOSIS — I10 Essential (primary) hypertension: Secondary | ICD-10-CM

## 2018-06-15 ENCOUNTER — Ambulatory Visit (INDEPENDENT_AMBULATORY_CARE_PROVIDER_SITE_OTHER): Payer: BC Managed Care – PPO

## 2018-06-15 ENCOUNTER — Telehealth: Payer: Self-pay | Admitting: Physician Assistant

## 2018-06-15 ENCOUNTER — Encounter: Payer: Self-pay | Admitting: Podiatry

## 2018-06-15 ENCOUNTER — Ambulatory Visit: Payer: BC Managed Care – PPO | Admitting: Physician Assistant

## 2018-06-15 ENCOUNTER — Ambulatory Visit: Payer: BC Managed Care – PPO | Admitting: Podiatry

## 2018-06-15 DIAGNOSIS — M779 Enthesopathy, unspecified: Secondary | ICD-10-CM

## 2018-06-15 DIAGNOSIS — M778 Other enthesopathies, not elsewhere classified: Secondary | ICD-10-CM

## 2018-06-15 DIAGNOSIS — M7752 Other enthesopathy of left foot: Secondary | ICD-10-CM

## 2018-06-15 MED ORDER — MELOXICAM 15 MG PO TABS: 15.0000 mg | ORAL_TABLET | Freq: Every day | ORAL | 1 refills | Status: AC

## 2018-06-15 NOTE — Telephone Encounter (Signed)
Pt needs Adriana to call him back regarding something that was sent in on his [place] .  Pt would not disclose any more info to me.  CB# [phone]  Thanks Con Memos

## 2018-06-17 NOTE — Progress Notes (Signed)
   HPI: 52 year old male presents the office today for follow-up evaluation regarding plantar fasciitis the right foot as well as a new complaint regarding pain to the left forefoot.  Patient was last seen 06/20/2017.  Patient states that the plantar fasciitis the right foot has resolved.  The new pain to the left forefoot has been ongoing for the past few weeks.  She started a job recently requires a significant amount of walking.  Patient has been taking ibuprofen as needed.  Patient denies injury.  Past Medical History:  Diagnosis Date  . Anxiety   . Arthritis    shoulders (before replacements)  . Asthma    as child  . Hypertension   . Kidney stones   . Motion sickness    deep sea fishing  . Shoulder pain    left and right     Physical Exam: General: The patient is alert and oriented x3 in no acute distress.  Dermatology: Skin is warm, dry and supple bilateral lower extremities. Negative for open lesions or macerations.  Vascular: Palpable pedal pulses bilaterally. No edema or erythema noted. Capillary refill within normal limits.  Neurological: Epicritic and protective threshold grossly intact bilaterally.   Musculoskeletal Exam: Significant pain on palpation of range of motion of the second MTPJ left foot.  Range of motion within normal limits to all pedal and ankle joints bilateral. Muscle strength 5/5 in all groups bilateral.   Radiographic Exam:  Normal osseous mineralization. Joint spaces preserved. No fracture/dislocation/boney destruction.    Assessment: 1.  Second MTPJ capsulitis left   Plan of Care:  1. Patient evaluated. X-Rays reviewed.  2.  Injection of 0.5 cc Celestone Soluspan injection the left second MTPJ 3.  Prescription for meloxicam 15 mg daily 4.  Return to clinic in 4 weeks      Edrick Kins, DPM [facility]  Dr. Edrick Kins, DPM    [address], [place] [address]                  Office [phone]  Fax [phone]

## 2018-06-19 NOTE — Telephone Encounter (Signed)
Called patient about his previous call. I introduced myself as Writer, a Librarian, academic at [employer]. Patient asks why he is not talking to the doctor. I reiterated that I am the physician assistant who is caring for him. He wants to know why things were sent into his life insurance. I inform him that I do not send anything in to life insurance and to please explain the situation. He says he was denied life insurance policy because his triglycerides are high and he's not being treated for it and also that there was drug use placed on his diagnosis list by myself and why did I do that. I informed him that his triglycerides are high but I did not treat them as he was focusing on weight loss and fitness, and I typically do not treat triglycerides at those levels. I also inform him that I do not see drug use on his problem list or as a diagnosis that I have ever placed on his chart. He says why would it be there if I didn't put it there and to that, I am not sure. I ask what can I do to fix this situation. He says I am just trying to pass him off and gets very angry and begins yelling at me over the phone. I inform him that I do not appreciate the tone the conversation has taken on and we need to take a minute to have some civility in the conversation. He reports that he does not appreciate my "smug ass" attitude and that I need to take a minute before "getting sassy with him." Patient hangs up phone at this point. I wll be forwarding this to practice management and discussing further management of this patient and whether or not he should continue to be treated at this clinic.

## 2018-08-06 ENCOUNTER — Other Ambulatory Visit: Payer: Self-pay | Admitting: Physician Assistant

## 2018-08-06 DIAGNOSIS — F419 Anxiety disorder, unspecified: Secondary | ICD-10-CM

## 2018-08-08 NOTE — Telephone Encounter (Signed)
Patient increased on amlodipine 02/2018 and instructed to follow up for BP. Did not. Needs follow up scheduled before more refills.

## 2018-08-13 NOTE — Telephone Encounter (Signed)
appt with Mikki Santee on 08/17/18

## 2018-08-17 ENCOUNTER — Ambulatory Visit: Payer: BC Managed Care – PPO | Admitting: Family Medicine

## 2018-08-17 ENCOUNTER — Encounter: Payer: Self-pay | Admitting: Family Medicine

## 2018-08-17 VITALS — BP 140/78 | HR 74 | Temp 98.3°F | Resp 16 | Wt 223.8 lb

## 2018-08-17 DIAGNOSIS — R739 Hyperglycemia, unspecified: Secondary | ICD-10-CM | POA: Diagnosis not present

## 2018-08-17 DIAGNOSIS — Z125 Encounter for screening for malignant neoplasm of prostate: Secondary | ICD-10-CM

## 2018-08-17 DIAGNOSIS — E781 Pure hyperglyceridemia: Secondary | ICD-10-CM

## 2018-08-17 DIAGNOSIS — I10 Essential (primary) hypertension: Secondary | ICD-10-CM

## 2018-08-17 NOTE — Patient Instructions (Signed)
We will call you with the lab results. 

## 2018-08-17 NOTE — Progress Notes (Signed)
  Subjective:     Patient ID: Stephen Ray, male   DOB: 07-15-1966, 52 y.o.   MRN: [id number] CC:  Chief Complaint  Patient presents with  . Back Pain    Patient comes in today just to follow up on treatments and discuss plan going forward with back pain. He reports that he has had 3 back injections, and it has helped.     Continues to be followed by Dr. Sharlet Salina. He has had EPI x 3 and physical therapy. States most of his residual back pain is relieved with ibuprofen at this point. Wishes to taper off duloxetine and I have suggested trying every other day for 1-2 weeks. Review of Systems  Respiratory: Negative for shortness of breath.   Cardiovascular: Negative for chest pain and palpitations.       Objective:   Physical Exam  Constitutional: He appears well-developed and well-nourished. No distress.  Cardiovascular: Normal rate and regular rhythm.  Pulmonary/Chest: Breath sounds normal.  Musculoskeletal: He exhibits no edema (of lower extremities).       Assessment:    1. Hyperglycemia: met c  2. Essential (primary) hypertension - Comprehensive metabolic panel  3. Screening for prostate cancefr - PSA  4. Hypertriglyceridemia: lipid profile   Plan:    Further f/u pending lab results.

## 2018-09-08 ENCOUNTER — Other Ambulatory Visit: Payer: Self-pay | Admitting: Physician Assistant

## 2018-09-08 DIAGNOSIS — F419 Anxiety disorder, unspecified: Secondary | ICD-10-CM

## 2018-10-15 DIAGNOSIS — G5602 Carpal tunnel syndrome, left upper limb: Secondary | ICD-10-CM | POA: Insufficient documentation

## 2018-10-19 ENCOUNTER — Other Ambulatory Visit: Payer: Self-pay | Admitting: Physician Assistant

## 2018-10-19 DIAGNOSIS — M792 Neuralgia and neuritis, unspecified: Secondary | ICD-10-CM

## 2018-10-22 ENCOUNTER — Other Ambulatory Visit: Payer: Self-pay | Admitting: Physician Assistant

## 2018-10-22 DIAGNOSIS — F419 Anxiety disorder, unspecified: Secondary | ICD-10-CM

## 2018-11-16 ENCOUNTER — Other Ambulatory Visit: Payer: Self-pay | Admitting: Physician Assistant

## 2018-11-16 DIAGNOSIS — F419 Anxiety disorder, unspecified: Secondary | ICD-10-CM

## 2018-11-25 ENCOUNTER — Other Ambulatory Visit: Payer: Self-pay | Admitting: Physician Assistant

## 2018-11-25 DIAGNOSIS — M792 Neuralgia and neuritis, unspecified: Secondary | ICD-10-CM

## 2018-11-29 ENCOUNTER — Other Ambulatory Visit: Payer: Self-pay | Admitting: Physician Assistant

## 2018-11-29 DIAGNOSIS — I10 Essential (primary) hypertension: Secondary | ICD-10-CM

## 2018-11-29 NOTE — Telephone Encounter (Signed)
Please advise 

## 2018-11-29 NOTE — Telephone Encounter (Signed)
[place] [address] faxed refill request for the following medications:  amLODipine (NORVASC) 10 MG tablet  90 day supply  Last Rx: LOV: Please advise. Thanks TNP

## 2018-11-30 MED ORDER — AMLODIPINE BESYLATE 10 MG PO TABS: 10.0000 mg | ORAL_TABLET | Freq: Every day | ORAL | 1 refills | Status: DC

## 2018-12-31 ENCOUNTER — Telehealth: Payer: Self-pay | Admitting: Physician Assistant

## 2018-12-31 DIAGNOSIS — M792 Neuralgia and neuritis, unspecified: Secondary | ICD-10-CM

## 2018-12-31 MED ORDER — GABAPENTIN 300 MG PO CAPS: ORAL_CAPSULE | ORAL | 1 refills | Status: DC

## 2018-12-31 NOTE — Telephone Encounter (Signed)
[place] faxed refill request for the following medications:  gabapentin (NEURONTIN) 300 MG capsule  Please advise. Thanks TNP

## 2018-12-31 NOTE — Telephone Encounter (Signed)
Please Review

## 2019-01-17 ENCOUNTER — Other Ambulatory Visit: Payer: Self-pay | Admitting: Physician Assistant

## 2019-01-17 NOTE — Telephone Encounter (Signed)
Please Review

## 2019-01-17 NOTE — Telephone Encounter (Signed)
Patient scheduled for appointment on Monday 01/21/2019 @ 2 PM

## 2019-01-17 NOTE — Telephone Encounter (Signed)
Needs follow up office visit and labwork.

## 2019-01-21 ENCOUNTER — Ambulatory Visit: Payer: Self-pay | Admitting: Physician Assistant

## 2019-01-22 ENCOUNTER — Ambulatory Visit: Payer: BC Managed Care – PPO | Admitting: Family Medicine

## 2019-01-22 ENCOUNTER — Other Ambulatory Visit: Payer: Self-pay

## 2019-01-22 ENCOUNTER — Encounter: Payer: Self-pay | Admitting: Family Medicine

## 2019-01-22 VITALS — BP 134/82 | HR 67 | Temp 98.4°F | Wt 218.0 lb

## 2019-01-22 DIAGNOSIS — I10 Essential (primary) hypertension: Secondary | ICD-10-CM | POA: Diagnosis not present

## 2019-01-22 DIAGNOSIS — R208 Other disturbances of skin sensation: Secondary | ICD-10-CM | POA: Diagnosis not present

## 2019-01-22 DIAGNOSIS — Z96611 Presence of right artificial shoulder joint: Secondary | ICD-10-CM | POA: Diagnosis not present

## 2019-01-22 DIAGNOSIS — Z96612 Presence of left artificial shoulder joint: Secondary | ICD-10-CM | POA: Diagnosis not present

## 2019-01-22 DIAGNOSIS — N200 Calculus of kidney: Secondary | ICD-10-CM

## 2019-01-22 NOTE — Progress Notes (Signed)
Stephen Ray  MRN: [id number] DOB: [birth year]  Subjective:  HPI   Patient to establish with new provider in the practice.  He had been seeing Mariel Sleet.  He states he is on BP medication and has not had any labwork done since 2018.  Past Surgical History:  Procedure Laterality Date  . CERVICAL FUSION  2001  . COLONOSCOPY WITH PROPOFOL N/A 03/31/2016   Procedure: COLONOSCOPY WITH PROPOFOL;  Surgeon: Lucilla Lame, MD;  Location: [place];  Service: Endoscopy;  Laterality: N/A;  . HERNIA REPAIR  2005  . LIPOMA EXCISION Right 2012   under arm  . LITHOTRIPSY  2002  . POLYPECTOMY  03/31/2016   Procedure: POLYPECTOMY;  Surgeon: Lucilla Lame, MD;  Location: [place];  Service: Endoscopy;;  . SHOULDER HEMI-ARTHROPLASTY Left 08/20/2015   Procedure: SHOULDER HEMI-ARTHROPLASTY;  Surgeon: Corky Mull, MD;  Location: [facility];  Service: Orthopedics;  Laterality: Left;  . SHOULDER SURGERY Right 12/25/2014  . TOTAL SHOULDER ARTHROPLASTY Right    Family History  Problem Relation Age of Onset  . Nephrolithiasis Father   . Cancer Father        Leukemia  . Kidney cancer Neg Hx   . Prostate cancer Neg Hx    Patient Active Problem List   Diagnosis Date Noted  . Lumbar spondylosis 05/11/2018  . Benign neoplasm of cecum   . Status post left shoulder hemiarthroplasty 08/20/2015  . Anxiety 03/30/2015  . S/P rotator cuff repair 03/30/2015  . Essential (primary) hypertension 03/30/2015  . Calculus of kidney 03/30/2015  . Arthritis or polyarthritis, rheumatoid ([place]) 03/30/2015  . Post-traumatic osteoarthritis, left shoulder 03/18/2015  . Post-traumatic osteoarthritis of right shoulder 03/20/2014  . Neuropathic pain 08/29/2013  . Chronic pain associated with significant psychosocial dysfunction 01/17/2013  . Recurrent nephrolithiasis 01/17/2013  . Renal colic 21/19/4174   Past Medical History:  Diagnosis Date  . Anxiety   . Arthritis    shoulders (before replacements)   . Asthma    as child  . Hypertension   . Kidney stones   . Motion sickness    deep sea fishing  . Shoulder pain    left and right   Social History   Socioeconomic History  . Marital status: Married    Spouse name: Not on file  . Number of children: Not on file  . Years of education: Not on file  . Highest education level: Not on file  Occupational History  . Occupation: Museum/gallery curator / Architect (Self employed)  Social Needs  . Financial resource strain: Not on file  . Food insecurity:    Worry: Not on file    Inability: Not on file  . Transportation needs:    Medical: Not on file    Non-medical: Not on file  Tobacco Use  . Smoking status: Former Research scientist (life sciences)  . Smokeless tobacco: Never Used  . Tobacco comment: quit 18 yrs ago  Substance and Sexual Activity  . Alcohol use: Yes    Alcohol/week: 4.0 standard drinks    Types: 4 Shots of liquor per week    Comment: Occasionally   . Drug use: No  . Sexual activity: Not on file  Lifestyle  . Physical activity:    Days per week: Not on file    Minutes per session: Not on file  . Stress: Not on file  Relationships  . Social connections:    Talks on phone: Not on file    Gets together: Not on file  Attends religious service: Not on file    Active member of club or organization: Not on file    Attends meetings of clubs or organizations: Not on file    Relationship status: Not on file  . Intimate partner violence:    Fear of current or ex partner: Not on file    Emotionally abused: Not on file    Physically abused: Not on file    Forced sexual activity: Not on file  Other Topics Concern  . Not on file  Social History Narrative  . Not on file   Outpatient Encounter Medications as of 01/22/2019  Medication Sig  . amLODipine (NORVASC) 10 MG tablet Take 1 tablet (10 mg total) by mouth daily.  . DULoxetine (CYMBALTA) 30 MG capsule Take 1 capsule by mouth once daily  . gabapentin (NEURONTIN) 300 MG capsule TAKE 1 CAPSULE BY  MOUTH TWICE DAILY AND 2 AT BEDTIME  . ibuprofen (ADVIL,MOTRIN) 200 MG tablet Take 600 mg by mouth daily as needed for moderate pain.  Marland Kitchen lisinopril (ZESTRIL) 40 MG tablet Take 1 tablet by mouth once daily   Facility-Administered Encounter Medications as of 01/22/2019  Medication  . betamethasone acetate-betamethasone sodium phosphate (CELESTONE) injection 3 mg  . betamethasone acetate-betamethasone sodium phosphate (CELESTONE) injection 3 mg   Allergies  Allergen Reactions  . Shellfish Allergy Anaphylaxis  . Shellfish-Derived Products Anaphylaxis   Review of Systems  Constitutional: Negative for fever and malaise/fatigue.  Respiratory: Negative for cough, shortness of breath and wheezing.   Cardiovascular: Negative for chest pain, palpitations, orthopnea, claudication and leg swelling.    Objective:  BP 134/82 (BP Location: Right Arm, Patient Position: Sitting, Cuff Size: Normal)   Pulse 67   Temp 98.4 F (36.9 C) (Oral)   Wt 218 lb (98.9 kg)   SpO2 96%   BMI 31.28 kg/m   Physical Exam  Constitutional: He is oriented to person, place, and time and well-developed, well-nourished, and in no distress.  HENT:  Head: Normocephalic.  Eyes: Conjunctivae are normal.  Neck: Neck supple.  Cardiovascular: Normal rate and regular rhythm.  Pulmonary/Chest: Effort normal and breath sounds normal.  Abdominal: Soft. Bowel sounds are normal.  Musculoskeletal: Normal range of motion.     Comments: Well healed scars both shoulders from past arthroplasty by Dr. Marton Rack.  Neurological: He is alert and oriented to person, place, and time.  Skin: No rash noted.  Dysesthesia RUQ of abdomen. No rash or pain to deep palpation. Burning discomfort to light touch.  Psychiatric: Mood, affect and judgment normal.    Assessment and Plan :   1. Dysesthesia Onset over the past couple weeks without known injury. No rash or signs of shingles. No GI upset. May use Aspercreme with Lidocaine. Recheck prn.  2.  Essential (primary) hypertension Well controlled BP. Tolerating Amlodipine 10 mg qd with Lisinopril 40 mg qd. No side effects. Will recheck routine labs. - CBC with Differential/Platelet - Comprehensive metabolic panel - Lipid panel  3. History of arthroplasty of left shoulder Surgery by Dr. Herb Rack 08-20-15 for post-traumatic osteoarthritis. Well healed with full ROM now.  4. History of arthroplasty of right shoulder Surgery by Dr. Chirstopher Rack 12-25-14 for post-traumatic osteoarthritis. Well healed with full ROM now.  5. Recurrent nephrolithiasis No recent discomfort or recurrences. Last evaluation by urologist (Dr. Louis Meckel) was in 2018. Recheck CBC and CMP. Encouraged to drink plenty of water. - CBC with Differential/Platelet - Comprehensive metabolic panel

## 2019-01-23 LAB — CBC WITH DIFFERENTIAL/PLATELET
Basophils Absolute: 0.1 10*3/uL (ref 0.0–0.2)
Basos: 1 %
EOS (ABSOLUTE): 0.1 10*3/uL (ref 0.0–0.4)
Eos: 3 %
Hematocrit: 40.7 % (ref 37.5–51.0)
Hemoglobin: 14.5 g/dL (ref 13.0–17.7)
Immature Grans (Abs): 0 10*3/uL (ref 0.0–0.1)
Immature Granulocytes: 0 %
Lymphocytes Absolute: 2.3 10*3/uL (ref 0.7–3.1)
Lymphs: 43 %
MCH: 32.1 pg (ref 26.6–33.0)
MCHC: 35.6 g/dL (ref 31.5–35.7)
MCV: 90 fL (ref 79–97)
Monocytes Absolute: 0.4 10*3/uL (ref 0.1–0.9)
Monocytes: 8 %
Neutrophils Absolute: 2.4 10*3/uL (ref 1.4–7.0)
Neutrophils: 45 %
Platelets: 282 10*3/uL (ref 150–450)
RBC: 4.52 x10E6/uL (ref 4.14–5.80)
RDW: 12 % (ref 11.6–15.4)
WBC: 5.3 10*3/uL (ref 3.4–10.8)

## 2019-01-23 LAB — COMPREHENSIVE METABOLIC PANEL
ALT: 41 IU/L (ref 0–44)
AST: 26 IU/L (ref 0–40)
Albumin/Globulin Ratio: 1.9 (ref 1.2–2.2)
Albumin: 4.5 g/dL (ref 3.8–4.9)
Alkaline Phosphatase: 96 IU/L (ref 39–117)
BUN/Creatinine Ratio: 13 (ref 9–20)
BUN: 12 mg/dL (ref 6–24)
Bilirubin Total: 0.4 mg/dL (ref 0.0–1.2)
CO2: 24 mmol/L (ref 20–29)
Calcium: 9.4 mg/dL (ref 8.7–10.2)
Chloride: 102 mmol/L (ref 96–106)
Creatinine, Ser: 0.96 mg/dL (ref 0.76–1.27)
GFR calc Af Amer: 104 mL/min/{1.73_m2} (ref >59)
GFR calc non Af Amer: 90 mL/min/{1.73_m2} (ref >59)
Globulin, Total: 2.4 g/dL (ref 1.5–4.5)
Glucose: 111 mg/dL — ABNORMAL HIGH (ref 65–99)
Potassium: 4 mmol/L (ref 3.5–5.2)
Sodium: 139 mmol/L (ref 134–144)
Total Protein: 6.9 g/dL (ref 6.0–8.5)

## 2019-01-23 LAB — LIPID PANEL
Chol/HDL Ratio: 4.2 ratio (ref 0.0–5.0)
Cholesterol, Total: 188 mg/dL (ref 100–199)
HDL: 45 mg/dL (ref >39)
LDL Calculated: 86 mg/dL (ref 0–99)
Triglycerides: 287 mg/dL — ABNORMAL HIGH (ref 0–149)
VLDL Cholesterol Cal: 57 mg/dL — ABNORMAL HIGH (ref 5–40)

## 2019-02-19 ENCOUNTER — Other Ambulatory Visit: Payer: Self-pay | Admitting: Physician Assistant

## 2019-03-03 IMAGING — CR DG LUMBAR SPINE COMPLETE 4+V
1 series · 5 of 5 positions shown · non-contrast
Comparison: Abdominal radiographs 02/09/2017. Abdominal CT
03/07/2013.

CLINICAL DATA: Low back pain radiating into the right hip for 5
weeks. No known injury.

EXAM:
LUMBAR SPINE - COMPLETE 4+ VIEW

[Series 1: dg lumbar spine complete 4 +v · 0.14mm/px · 5 of 5 slices shown]
[im 1/5]
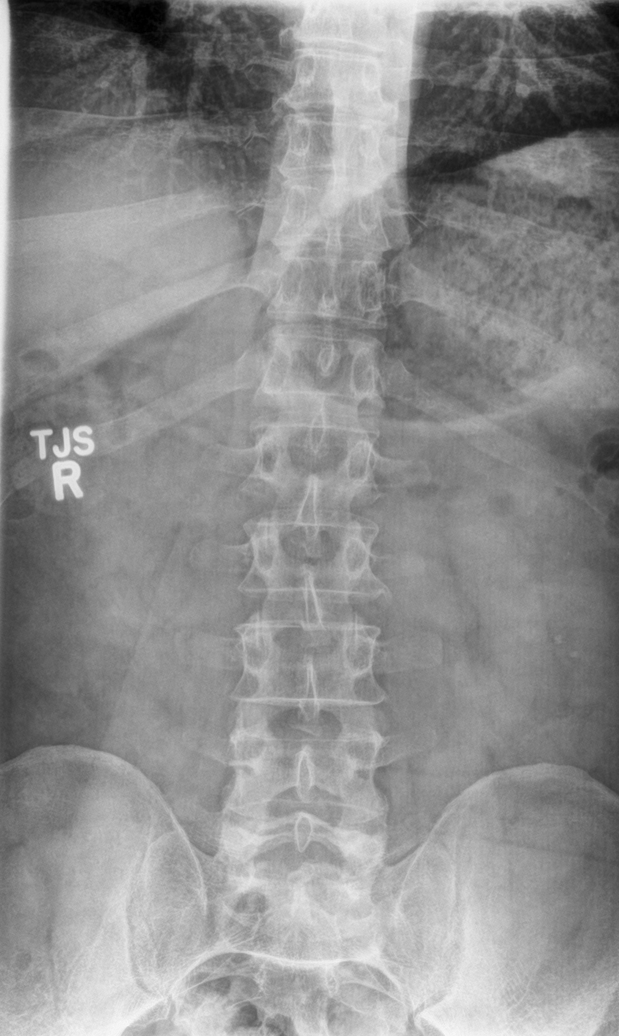
[im 2/5]
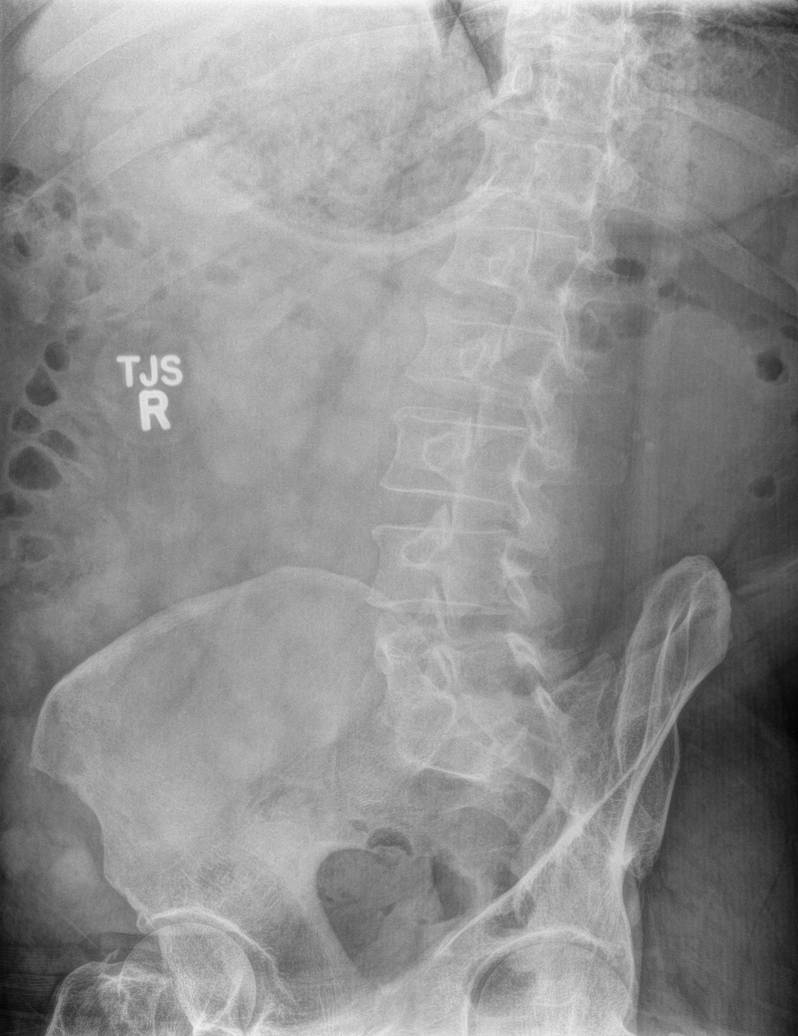
[im 3/5]
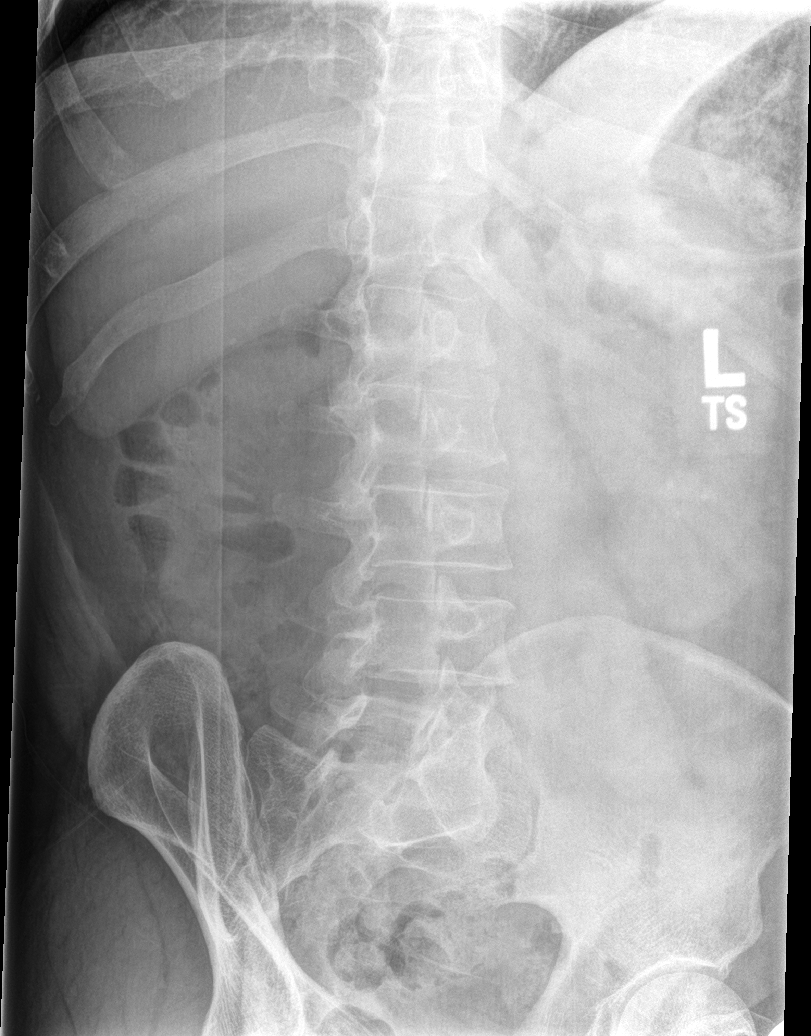
[im 4/5]
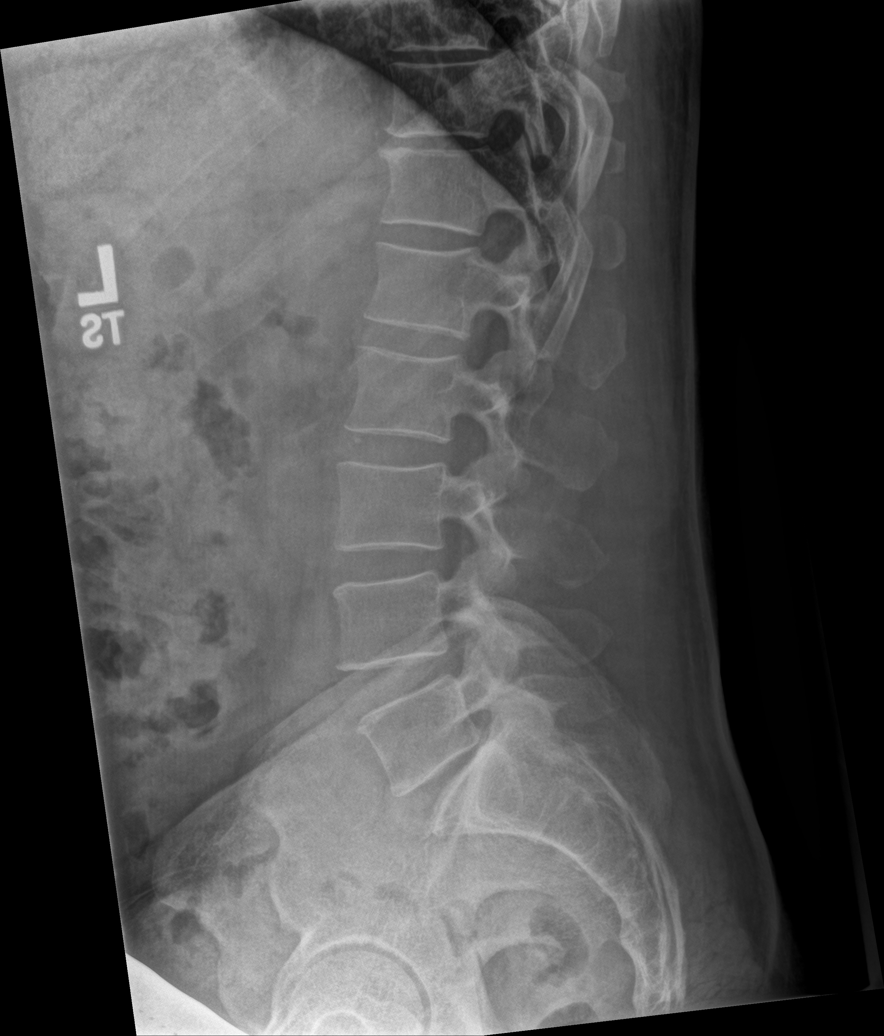
[im 5/5]
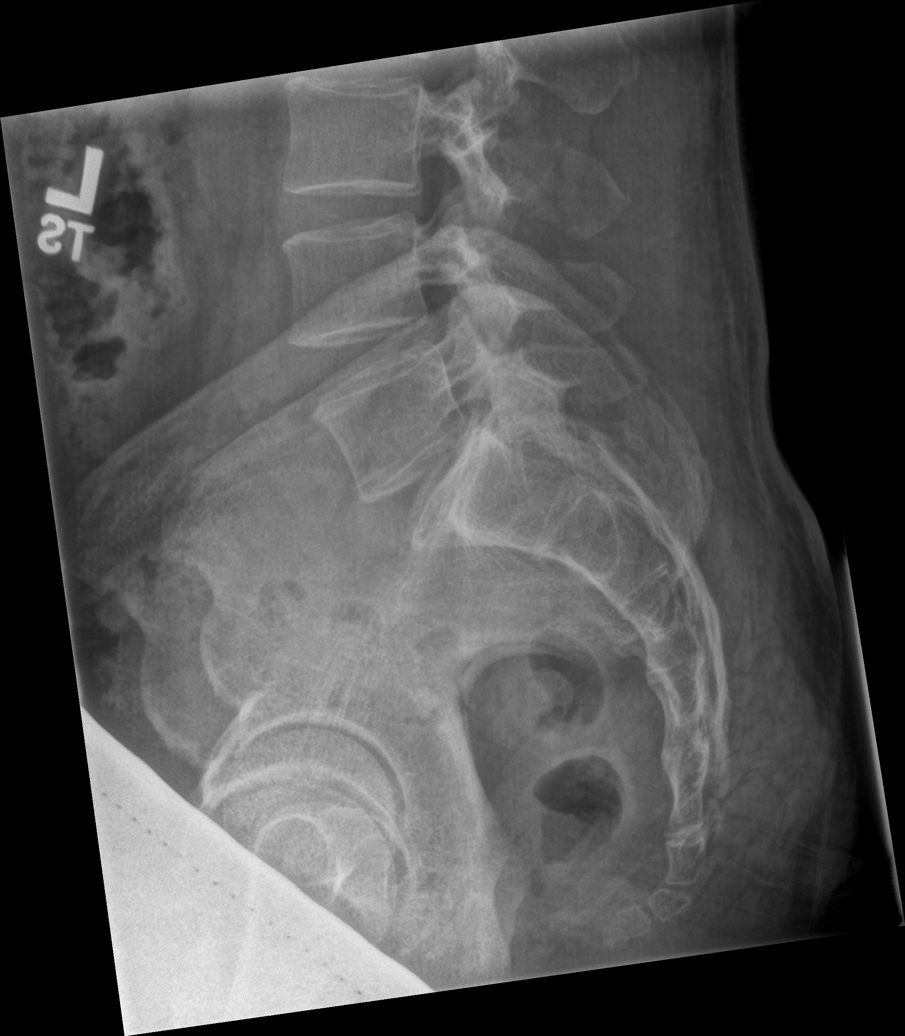

[5 of 5 positions shown; findings below may reference images not displayed]

FINDINGS: Five lumbar type vertebral bodies. The alignment is normal. There is
mild disc space narrowing at L5-S1. The additional disc spaces are
preserved. No evidence of acute fracture or pars defect. There are
mild facet degenerative changes bilaterally. Two calcifications
overlie the left kidney, likely renal calculi based on previous CT.
IMPRESSION: No acute lumbar spine findings. Mild degenerative disc disease at
L5-S1 and mild facet hypertrophy. Left renal calculi, similar to
previous CT.

## 2019-03-21 ENCOUNTER — Other Ambulatory Visit: Payer: Self-pay | Admitting: Physician Assistant

## 2019-03-25 ENCOUNTER — Encounter: Payer: Self-pay | Admitting: Family Medicine

## 2019-03-25 ENCOUNTER — Ambulatory Visit (INDEPENDENT_AMBULATORY_CARE_PROVIDER_SITE_OTHER): Payer: BC Managed Care – PPO | Admitting: Family Medicine

## 2019-03-25 ENCOUNTER — Other Ambulatory Visit: Payer: Self-pay

## 2019-03-25 VITALS — BP 145/84 | Temp 97.9°F

## 2019-03-25 DIAGNOSIS — R52 Pain, unspecified: Secondary | ICD-10-CM

## 2019-03-25 DIAGNOSIS — I10 Essential (primary) hypertension: Secondary | ICD-10-CM | POA: Diagnosis not present

## 2019-03-25 NOTE — Progress Notes (Signed)
Virtual Visit via Video Note  I connected with Stephen Ray on 03/25/19 at  2:20 PM EDT by a video enabled telemedicine application and verified that I am speaking with the correct person using two identifiers.  Location: Patient: Home Provider: Office   I discussed the limitations of evaluation and management by telemedicine and the availability of in person appointments. The patient expressed understanding and agreed to proceed.  History of Present Illness:  Body aches that started 03/24/2019. Patient states that he worked outside all weekend in the heat. No exposure to anyone possibly having Covid symptoms. History of asthma as a child, but has not used an inhaler since age 63. Denies cough, headache, shortness of breath, congestion, loss of taste or smell and only slight diarrhea once after eating a lot of watermelon. Feeling better now than he did this morning. Has taken a little Advil.  Requests refill of his Lisinopril 40 mg qd for BP control with Amlodipine 10 mg qd. BP was 145/84 today and he did not have any Lisinopril today. Has tolerated this regimen without side effects for a long time now.   Allergies  Allergen Reactions  . Shellfish Allergy Anaphylaxis  . Shellfish-Derived Products Anaphylaxis   Current Outpatient Medications on File Prior to Visit  Medication Sig Dispense Refill  . amLODipine (NORVASC) 10 MG tablet Take 1 tablet (10 mg total) by mouth daily. 90 tablet 1  . DULoxetine (CYMBALTA) 30 MG capsule Take 1 capsule by mouth once daily 90 capsule 1  . gabapentin (NEURONTIN) 300 MG capsule TAKE 1 CAPSULE BY MOUTH TWICE DAILY AND 2 AT BEDTIME 120 capsule 1  . ibuprofen (ADVIL,MOTRIN) 200 MG tablet Take 600 mg by mouth daily as needed for moderate pain.    Marland Kitchen lisinopril (ZESTRIL) 40 MG tablet Take 1 tablet by mouth once daily 90 tablet 1   Current Facility-Administered Medications on File Prior to Visit  Medication Dose Route Frequency Provider Last Rate Last Dose  .  betamethasone acetate-betamethasone sodium phosphate (CELESTONE) injection 3 mg  3 mg Intramuscular Once Daylene Katayama M, DPM      . betamethasone acetate-betamethasone sodium phosphate (CELESTONE) injection 3 mg  3 mg Intramuscular Once Edrick Kins, DPM        Observations/Objective:  Past Medical History:  Diagnosis Date  . Anxiety   . Arthritis    shoulders (before replacements)  . Asthma    as child  . Hypertension   . Kidney stones   . Motion sickness    deep sea fishing  . Shoulder pain    left and right   Past Surgical History:  Procedure Laterality Date  . CERVICAL FUSION  2001  . COLONOSCOPY WITH PROPOFOL N/A 03/31/2016   Procedure: COLONOSCOPY WITH PROPOFOL;  Surgeon: Lucilla Lame, MD;  Location: [place];  Service: Endoscopy;  Laterality: N/A;  . HERNIA REPAIR  2005  . LIPOMA EXCISION Right 2012   under arm  . LITHOTRIPSY  2002  . POLYPECTOMY  03/31/2016   Procedure: POLYPECTOMY;  Surgeon: Lucilla Lame, MD;  Location: [place];  Service: Endoscopy;;  . SHOULDER HEMI-ARTHROPLASTY Left 08/20/2015   Procedure: SHOULDER HEMI-ARTHROPLASTY;  Surgeon: Corky Mull, MD;  Location: [facility];  Service: Orthopedics;  Laterality: Left;  . SHOULDER SURGERY Right 12/25/2014  . TOTAL SHOULDER ARTHROPLASTY Right    Family History  Problem Relation Age of Onset  . Nephrolithiasis Father   . Cancer Father        Leukemia  .  Kidney cancer Neg Hx   . Prostate cancer Neg Hx    Allergies  Allergen Reactions  . Shellfish Allergy Anaphylaxis  . Shellfish-Derived Products Anaphylaxis   Current Outpatient Medications on File Prior to Visit  Medication Sig Dispense Refill  . amLODipine (NORVASC) 10 MG tablet Take 1 tablet (10 mg total) by mouth daily. 90 tablet 1  . DULoxetine (CYMBALTA) 30 MG capsule Take 1 capsule by mouth once daily 90 capsule 1  . gabapentin (NEURONTIN) 300 MG capsule TAKE 1 CAPSULE BY MOUTH TWICE DAILY AND 2 AT BEDTIME 120 capsule 1  .  ibuprofen (ADVIL,MOTRIN) 200 MG tablet Take 600 mg by mouth daily as needed for moderate pain.    Marland Kitchen lisinopril (ZESTRIL) 40 MG tablet Take 1 tablet by mouth once daily 90 tablet 1   Current Facility-Administered Medications on File Prior to Visit  Medication Dose Route Frequency Provider Last Rate Last Dose  . betamethasone acetate-betamethasone sodium phosphate (CELESTONE) injection 3 mg  3 mg Intramuscular Once Daylene Katayama M, DPM      . betamethasone acetate-betamethasone sodium phosphate (CELESTONE) injection 3 mg  3 mg Intramuscular Once Edrick Kins, DPM       Today's Vitals   03/25/19 0906  BP: (!) 145/84  Temp: 97.9 F (36.6 C)  TempSrc: Oral   There is no height or weight on file to calculate BMI.  WDWN male in no apparent distress.  Head: Normocephalic, atraumatic. Neck: Supple, NROM Respiratory: No apparent distress Psych: Normal mood and affect  Assessment and Plan: 1. Generalized body aches Onset this morning without fever (temp 97-98 over the weekend) with joint and muscle aches. Admits to heavy physical work in the very hot weather the past couple days and works in Architect normally. Has been drinking more fluids and took a couple Advil today. Feeling much improved without respiratory symptoms. Suspect near heat exhaustion and reminded him to follow COVID-19 prevention protocols (frequent hand washing, face covering and social distancing). He promised to notify us if he has any fever or other COVID symptoms to develop. He has not been exposed to anyone positive with the COVID-19 virus.  2. Essential (primary) hypertension BP slightly elevated at 145/84 today. Sent 90 day refills to [facility] this morning and should be available for pick up now. Continue to monitor BP and continue the Amlodipine 10 mg qd, also.   Follow Up Instructions:    I discussed the assessment and treatment plan with the patient. The patient was provided an opportunity to ask  questions and all were answered. The patient agreed with the plan and demonstrated an understanding of the instructions.   The patient was advised to call back or seek an in-person evaluation if the symptoms worsen or if the condition fails to improve as anticipated.  I provided 10 minutes of non-face-to-face time during this encounter.   Janeece Riggers, LPN

## 2019-03-26 ENCOUNTER — Other Ambulatory Visit: Payer: Self-pay

## 2019-03-26 ENCOUNTER — Ambulatory Visit: Payer: BC Managed Care – PPO | Admitting: General Surgery

## 2019-03-26 ENCOUNTER — Encounter: Payer: Self-pay | Admitting: General Surgery

## 2019-03-26 VITALS — BP 154/95 | HR 80 | Temp 97.9°F | Resp 16 | Ht 70.0 in | Wt 216.2 lb

## 2019-03-26 DIAGNOSIS — D1721 Benign lipomatous neoplasm of skin and subcutaneous tissue of right arm: Secondary | ICD-10-CM

## 2019-03-26 NOTE — Progress Notes (Addendum)
Patient ID: Stephen Ray, male   DOB: Jan 11, 1966, 53 y.o.   MRN: [id number]  Chief Complaint  Patient presents with  . Lipoma    HPI Stephen Ray is a 53 y.o. male here today for a evaluation of a lipoma on his right armpit.  HPI  Past Medical History:  Diagnosis Date  . Anxiety   . Arthritis    shoulders (before replacements)  . Asthma    as child  . Hypertension   . Kidney stones   . Motion sickness    deep sea fishing  . Shoulder pain    left and right    Past Surgical History:  Procedure Laterality Date  . CERVICAL FUSION  2001  . COLONOSCOPY WITH PROPOFOL N/A 03/31/2016   Procedure: COLONOSCOPY WITH PROPOFOL;  Surgeon: Lucilla Lame, MD;  Location: [facility];  Service: Endoscopy;  Laterality: N/A;  . HERNIA REPAIR  2005  . LIPOMA EXCISION Right 2012   under arm  . LITHOTRIPSY  2002  . POLYPECTOMY  03/31/2016   Procedure: POLYPECTOMY;  Surgeon: Lucilla Lame, MD;  Location: [facility];  Service: Endoscopy;;  . SHOULDER HEMI-ARTHROPLASTY Left 08/20/2015   Procedure: SHOULDER HEMI-ARTHROPLASTY;  Surgeon: Corky Mull, MD;  Location: [facility];  Service: Orthopedics;  Laterality: Left;  . SHOULDER SURGERY Right 12/25/2014  . TOTAL SHOULDER ARTHROPLASTY Right     Family History  Problem Relation Age of Onset  . Nephrolithiasis Father   . Cancer Father        Leukemia  . Kidney cancer Neg Hx   . Prostate cancer Neg Hx     Social History Social History   Tobacco Use  . Smoking status: Former Research scientist (life sciences)  . Smokeless tobacco: Never Used  . Tobacco comment: quit 18 yrs ago  Substance Use Topics  . Alcohol use: Yes    Alcohol/week: 4.0 standard drinks    Types: 4 Shots of liquor per week    Comment: Occasionally   . Drug use: No    Allergies  Allergen Reactions  . Shellfish Allergy Anaphylaxis  . Shellfish-Derived Products Anaphylaxis    Current Outpatient Medications  Medication Sig Dispense Refill  . amLODipine (NORVASC) 10 MG tablet  Take 1 tablet (10 mg total) by mouth daily. 90 tablet 1  . DULoxetine (CYMBALTA) 30 MG capsule Take 1 capsule by mouth once daily 90 capsule 1  . gabapentin (NEURONTIN) 300 MG capsule TAKE 1 CAPSULE BY MOUTH TWICE DAILY AND 2 AT BEDTIME 120 capsule 1  . ibuprofen (ADVIL,MOTRIN) 200 MG tablet Take 600 mg by mouth daily as needed for moderate pain.    Marland Kitchen lisinopril (ZESTRIL) 40 MG tablet Take 1 tablet by mouth once daily 90 tablet 1   Current Facility-Administered Medications  Medication Dose Route Frequency Provider Last Rate Last Dose  . betamethasone acetate-betamethasone sodium phosphate (CELESTONE) injection 3 mg  3 mg Intramuscular Once Daylene Katayama M, DPM      . betamethasone acetate-betamethasone sodium phosphate (CELESTONE) injection 3 mg  3 mg Intramuscular Once Edrick Kins, DPM        Review of Systems Review of Systems  Constitutional: Negative.   Respiratory: Negative.   Cardiovascular: Negative.     Blood pressure (!) 154/95, pulse 80, temperature 97.9 F (36.6 C), temperature source Temporal, resp. rate 16, height 5\' 10"  (1.778 m), weight 216 lb 3.2 oz (98.1 kg), SpO2 95 %.  Physical Exam Physical Exam Constitutional:      Appearance: Normal  appearance.  Neck:     Musculoskeletal: Normal range of motion and neck supple.  Musculoskeletal:       Arms:  Skin:    General: Skin is warm and dry.  Neurological:     General: No focal deficit present.     Mental Status: He is alert and oriented to person, place, and time.     Data Reviewed Pathology from the patient's June 15, 2012 axillary resection was reviewed.  It was reported that 3 samples including mature appearing fat and samples a and B and an atypical lipoma measuring 21 x 11 x 3 cm were resected at that time. The largest speciment weighed 380 g.  This was described as mature fat with minimal nuclear atypia.  CD34 staining showed fibrous material.  MDM 2 showed small foci of nuclear localization and patchy  areas throughout the tumor.  This lesion was designated based on size and immunohistochemistry as a "atypical lipoma.  Specimen C was described as "additional brachial plexus tissue and measured 5 x 3.5 x 1.5 cm and was homogeneous mature adipose tissue. The procedure was completed by  Abbe Amsterdam, MD at [facility].     Assessment Recurrent axillary lipoma, original pathology reporting "atypia".  Plan As this area has recurred, and asked the patient describes neurologic changes post original excision, imaging would be appropriate to determine extent of disease.. Dr. will call and let you know if we will order CT vs MRI.   Will review with radiology options for imaging and contact the patient with that information.  HPI, Physical Exam, Assessment and Plan have been scribed under the direction and in the presence of Hervey Ard, MD.  Gaspar Cola, CMA  I have completed the exam and reviewed the above documentation for accuracy and completeness.  I agree with the above.  Haematologist has been used and any errors in dictation or transcription are unintentional.  Hervey Ard, M.D., F.A.C.S. [name]  03/27/2019, 8:28 PM  I spoke with Elenore Rota, MD from orthopedics.  The patient has bilateral titanium, MRI compatible shoulder replacements.  In discussion with radiology, MRI would be the ideal modality for evaluating this lesion.

## 2019-03-26 NOTE — Patient Instructions (Signed)
We would like for you to have some imaging done. Dr.Byrnett will call and let you know if we will order CT vs MRI.

## 2019-03-27 DIAGNOSIS — D1721 Benign lipomatous neoplasm of skin and subcutaneous tissue of right arm: Secondary | ICD-10-CM | POA: Insufficient documentation

## 2019-03-28 ENCOUNTER — Telehealth: Payer: Self-pay

## 2019-03-28 ENCOUNTER — Other Ambulatory Visit: Payer: Self-pay

## 2019-03-28 DIAGNOSIS — R2231 Localized swelling, mass and lump, right upper limb: Secondary | ICD-10-CM

## 2019-03-28 NOTE — Telephone Encounter (Signed)
Per Dr Bary Castilla patient to have a right MRI to assess recurrent right axilla atypical lipoma.  The patient is scheduled for an MRI of the right shoulder at [facility] on 04/09/19 at 6:00 pm. He is to arrive by 5:45 pm and enter through the [facility]. He is aware of date, time, and instructions.

## 2019-04-09 ENCOUNTER — Other Ambulatory Visit: Payer: Self-pay

## 2019-04-09 ENCOUNTER — Ambulatory Visit
Admission: RE | Admit: 2019-04-09 | Discharge: 2019-04-09 | Disposition: A | Discharge status: Home / Self Care | Payer: BC Managed Care – PPO | Source: Ambulatory Visit | Attending: General Surgery | Admitting: General Surgery

## 2019-04-09 DIAGNOSIS — R2231 Localized swelling, mass and lump, right upper limb: Secondary | ICD-10-CM | POA: Diagnosis present

## 2019-04-09 LAB — POCT I-STAT CREATININE: Creatinine, Ser: 1.1 mg/dL (ref 0.61–1.24)

## 2019-04-09 MED ORDER — GADOBUTROL 1 MMOL/ML IV SOLN
10.0000 mL | Freq: Once | INTRAVENOUS | Status: AC | PRN
  Administered 2019-04-09: 10 mL via INTRAVENOUS

## 2019-04-10 ENCOUNTER — Telehealth: Payer: Self-pay | Admitting: *Deleted

## 2019-04-10 NOTE — Telephone Encounter (Signed)
Notified patient as instructed,Lipoma only  patient pleased. Discussed follow-up appointments, patient agrees

## 2019-04-18 ENCOUNTER — Other Ambulatory Visit: Payer: Self-pay | Admitting: Physician Assistant

## 2019-04-18 DIAGNOSIS — M792 Neuralgia and neuritis, unspecified: Secondary | ICD-10-CM

## 2019-04-24 ENCOUNTER — Other Ambulatory Visit
Admission: RE | Admit: 2019-04-24 | Discharge: 2019-04-24 | Disposition: A | Discharge status: Home / Self Care | Payer: BC Managed Care – PPO | Source: Ambulatory Visit | Attending: Surgery | Admitting: Surgery

## 2019-04-24 ENCOUNTER — Encounter: Payer: Self-pay | Admitting: Surgery

## 2019-04-24 ENCOUNTER — Telehealth: Payer: Self-pay | Admitting: *Deleted

## 2019-04-24 ENCOUNTER — Other Ambulatory Visit: Payer: Self-pay

## 2019-04-24 ENCOUNTER — Ambulatory Visit
Admission: RE | Admit: 2019-04-24 | Discharge: 2019-04-24 | Disposition: A | Discharge status: Home / Self Care | Payer: BC Managed Care – PPO | Source: Ambulatory Visit | Attending: Surgery | Admitting: Surgery

## 2019-04-24 ENCOUNTER — Ambulatory Visit
Admission: RE | Admit: 2019-04-24 | Discharge: 2019-04-24 | Disposition: A | Discharge status: Home / Self Care | Payer: BC Managed Care – PPO | Attending: Surgery | Admitting: Surgery

## 2019-04-24 ENCOUNTER — Ambulatory Visit: Payer: BC Managed Care – PPO | Admitting: Surgery

## 2019-04-24 VITALS — BP 160/74 | HR 71 | Temp 97.9°F | Ht 69.0 in | Wt 215.0 lb

## 2019-04-24 DIAGNOSIS — S2249XK Multiple fractures of ribs, unspecified side, subsequent encounter for fracture with nonunion: Secondary | ICD-10-CM

## 2019-04-24 LAB — BASIC METABOLIC PANEL
Anion gap: 10 (ref 5–15)
BUN: 18 mg/dL (ref 6–20)
CO2: 28 mmol/L (ref 22–32)
Calcium: 9.7 mg/dL (ref 8.9–10.3)
Chloride: 102 mmol/L (ref 98–111)
Creatinine, Ser: 0.85 mg/dL (ref 0.61–1.24)
GFR calc Af Amer: >60 mL/min (ref >60)
GFR calc non Af Amer: >60 mL/min (ref >60)
Glucose, Bld: 103 mg/dL — ABNORMAL HIGH (ref 70–99)
Potassium: 3.5 mmol/L (ref 3.5–5.1)
Sodium: 140 mmol/L (ref 135–145)

## 2019-04-24 LAB — CBC WITH DIFFERENTIAL/PLATELET
Abs Immature Granulocytes: 0.03 10*3/uL (ref 0.00–0.07)
Basophils Absolute: 0.1 10*3/uL (ref 0.0–0.1)
Basophils Relative: 1 %
Eosinophils Absolute: 0.2 10*3/uL (ref 0.0–0.5)
Eosinophils Relative: 2 %
HCT: 40.7 % (ref 39.0–52.0)
Hemoglobin: 14.1 g/dL (ref 13.0–17.0)
Immature Granulocytes: 0 %
Lymphocytes Relative: 36 %
Lymphs Abs: 2.7 10*3/uL (ref 0.7–4.0)
MCH: 32.2 pg (ref 26.0–34.0)
MCHC: 34.6 g/dL (ref 30.0–36.0)
MCV: 92.9 fL (ref 80.0–100.0)
Monocytes Absolute: 0.6 10*3/uL (ref 0.1–1.0)
Monocytes Relative: 8 %
Neutro Abs: 3.9 10*3/uL (ref 1.7–7.7)
Neutrophils Relative %: 53 %
Platelets: 226 10*3/uL (ref 150–400)
RBC: 4.38 MIL/uL (ref 4.22–5.81)
RDW: 12.8 % (ref 11.5–15.5)
WBC: 7.4 10*3/uL (ref 4.0–10.5)
nRBC: 0 % (ref 0.0–0.2)

## 2019-04-24 NOTE — Telephone Encounter (Addendum)
-----   Message from Jules Husbands, MD sent at 04/24/2019  4:16 PM EDT ----- Stephen Ray reviewed and he has one rib fracture, please let him know the lab work was otherwise normal.  Notified patient as instructed, patient pleased.  Discussed follow-up appointments for 3 weeks, patient agrees. Sept 2 2020 at 3:30  ----- Message ----- From: Interface, Rad Results In Sent: 04/24/2019   3:45 PM EDT To: Jules Husbands, MD

## 2019-04-24 NOTE — Progress Notes (Signed)
Outpatient Surgical Follow Up  04/24/2019  Stephen Ray is an 53 y.o. male.   Chief Complaint  Patient presents with  . Follow-up    MRI     HPI: 53 year old male following up after a recent MRI of the right axilla for a soft tissue mass.  Of note he did have a history of atypical lipoma that was removed at [facility] several years ago.  He now comes with an increase in size of the right axillary lipoma.  He sometimes have some numbness and intermittent shooting pains going to the right arm.  No fevers no chills.  He does report increasing size in the mass.  Of note less than 36 hours he fell on the stairs that were stainless steel and hit his right chest wall.  He is complaining now some moderate to severe right chest pain within the chest wall that gets worse with inspiration.  No fevers no chills no significant respiratory distress.  He is continues to work walk without any difficulty.  I did review his MRI personally showing evidence of an axillary mass on the right side consistent with a lipoma.  There is no evidence for concerning metastatic disease or metastatic lymph node or primary sarcoma.  Past Medical History:  Diagnosis Date  . Anxiety   . Arthritis    shoulders (before replacements)  . Asthma    as child  . Hypertension   . Kidney stones   . Motion sickness    deep sea fishing  . Shoulder pain    left and right    Past Surgical History:  Procedure Laterality Date  . CERVICAL FUSION  2001  . COLONOSCOPY WITH PROPOFOL N/A 03/31/2016   Procedure: COLONOSCOPY WITH PROPOFOL;  Surgeon: Lucilla Lame, MD;  Location: [facility];  Service: Endoscopy;  Laterality: N/A;  . HERNIA REPAIR  2005  . LIPOMA EXCISION Right 2012   under arm  . LITHOTRIPSY  2002  . POLYPECTOMY  03/31/2016   Procedure: POLYPECTOMY;  Surgeon: Lucilla Lame, MD;  Location: [place];  Service: Endoscopy;;  . SHOULDER HEMI-ARTHROPLASTY Left 08/20/2015   Procedure: SHOULDER HEMI-ARTHROPLASTY;   Surgeon: Corky Mull, MD;  Location: [facility];  Service: Orthopedics;  Laterality: Left;  . SHOULDER SURGERY Right 12/25/2014  . TOTAL SHOULDER ARTHROPLASTY Right     Family History  Problem Relation Age of Onset  . Nephrolithiasis Father   . Cancer Father        Leukemia  . Kidney cancer Neg Hx   . Prostate cancer Neg Hx     Social History:  reports that he has quit smoking. He has never used smokeless tobacco. He reports current alcohol use of about 4.0 standard drinks of alcohol per week. He reports that he does not use drugs.  Allergies:  Allergies  Allergen Reactions  . Shellfish Allergy Anaphylaxis  . Shellfish-Derived Products Anaphylaxis    Medications reviewed.    ROS Full ROS performed and is otherwise negative other than what is stated in HPI   BP (!) 160/74   Pulse 71   Temp 97.9 F (36.6 C) (Skin)   Ht 5\' 9"  (1.753 m)   Wt 215 lb (97.5 kg)   SpO2 98%   BMI 31.75 kg/m   Physical Exam Vitals signs and nursing note reviewed.  Constitutional:      General: He is not in acute distress.    Appearance: Normal appearance. He is normal weight.  Eyes:  General: No scleral icterus.       Right eye: No discharge.        Left eye: No discharge.  Neck:     Musculoskeletal: Normal range of motion and neck supple.  Cardiovascular:     Rate and Rhythm: Normal rate and regular rhythm.     Pulses: Normal pulses.  Pulmonary:     Effort: Pulmonary effort is normal. No respiratory distress.     Breath sounds: No stridor. No rhonchi.     Comments: There is evidence of significant chest wall tenderness from 8-12 rib on the posterior aspect.  There is no evidence of subcu emphysema. There is a 5 cm subcutaneous soft tissue mass within the right axilla that is mobile.  Is not tender.  No evidence of infection. Abdominal:     General: Abdomen is flat. There is no distension.     Palpations: There is no mass.     Tenderness: There is no abdominal tenderness.      Hernia: No hernia is present.  Musculoskeletal: Normal range of motion.  Skin:    Capillary Refill: Capillary refill takes less than 2 seconds.  Neurological:     General: No focal deficit present.     Mental Status: He is alert and oriented to person, place, and time.  Psychiatric:        Mood and Affect: Mood normal.        Behavior: Behavior normal.        Thought Content: Thought content normal.        Judgment: Judgment normal.       Assessment/Plan: 53 year old male status post fall and likely right-sided rib fractures.  We will send him for stat chest x-ray CBC and BMP.  Am concerned about a potential pneumothorax and potential admission to the patient.  As far as the right axillary lipoma we will be happy to perform excision in a few weeks once his pulmonary status and his rib fracture improves.  I have discussed with the patient in detail about options regarding the lipoma of the axilla.  Excisional biopsy versus incisional biopsy discussed with him in detail.  He is leaning towards excisional biopsy.  I will see him back in about 3 weeks and hopefully at that time his rib fracture is better.   Greater than 50% of the 40 minutes  visit was spent in counseling/coordination of care   Caroleen Hamman, MD [facility]

## 2019-04-24 NOTE — Patient Instructions (Signed)
Chest xray today and labs

## 2019-05-01 ENCOUNTER — Telehealth: Payer: Self-pay | Admitting: *Deleted

## 2019-05-01 MED ORDER — CYCLOBENZAPRINE HCL 5 MG PO TABS: 5.0000 mg | ORAL_TABLET | Freq: Three times a day (TID) | ORAL | 0 refills | Status: DC | PRN

## 2019-05-01 NOTE — Telephone Encounter (Signed)
Patient called and stated that he seen Dr.Pabon last week and he has some rib fractures, he is taking tylenol, he wants to see if he can get something to help him sleep at night, not pain medication. Please call and advise

## 2019-05-01 NOTE — Telephone Encounter (Signed)
Let message that medication was called in for him, Flexeril.

## 2019-05-01 NOTE — Telephone Encounter (Signed)
He states he has tried taking 2 Benadryl for the past 3 nights without success.

## 2019-05-22 ENCOUNTER — Ambulatory Visit: Payer: BC Managed Care – PPO | Admitting: Surgery

## 2019-05-24 ENCOUNTER — Other Ambulatory Visit: Payer: Self-pay | Admitting: Family Medicine

## 2019-05-24 ENCOUNTER — Telehealth: Payer: Self-pay | Admitting: Physician Assistant

## 2019-05-24 DIAGNOSIS — M792 Neuralgia and neuritis, unspecified: Secondary | ICD-10-CM

## 2019-05-24 DIAGNOSIS — F419 Anxiety disorder, unspecified: Secondary | ICD-10-CM

## 2019-05-28 NOTE — Telephone Encounter (Signed)
Patient is requesting a refill. He has been without medication for 2 days. He is at the pharmacy now if RX could be sent ASAP.

## 2019-05-28 NOTE — Telephone Encounter (Signed)
L.O.V. was 03/25/2019 and no upcoming appointment,please advise.

## 2019-05-28 NOTE — Telephone Encounter (Signed)
Sent to his pharmacy ([place]).

## 2019-05-30 ENCOUNTER — Other Ambulatory Visit: Payer: Self-pay | Admitting: Physician Assistant

## 2019-05-30 DIAGNOSIS — I10 Essential (primary) hypertension: Secondary | ICD-10-CM

## 2019-06-29 ENCOUNTER — Other Ambulatory Visit: Payer: Self-pay | Admitting: Family Medicine

## 2019-06-29 DIAGNOSIS — M792 Neuralgia and neuritis, unspecified: Secondary | ICD-10-CM

## 2019-07-02 ENCOUNTER — Other Ambulatory Visit: Payer: Self-pay | Admitting: Family Medicine

## 2019-07-02 DIAGNOSIS — M792 Neuralgia and neuritis, unspecified: Secondary | ICD-10-CM

## 2019-07-10 ENCOUNTER — Encounter: Payer: Self-pay | Admitting: *Deleted

## 2019-07-31 ENCOUNTER — Other Ambulatory Visit: Payer: Self-pay | Admitting: Family Medicine

## 2019-07-31 DIAGNOSIS — M792 Neuralgia and neuritis, unspecified: Secondary | ICD-10-CM

## 2019-08-05 ENCOUNTER — Ambulatory Visit: Payer: Self-pay | Admitting: Family Medicine

## 2019-08-06 ENCOUNTER — Other Ambulatory Visit: Payer: Self-pay

## 2019-08-06 ENCOUNTER — Ambulatory Visit: Payer: BC Managed Care – PPO | Admitting: Family Medicine

## 2019-08-06 ENCOUNTER — Encounter: Payer: Self-pay | Admitting: Family Medicine

## 2019-08-06 VITALS — BP 122/78 | Temp 97.2°F | Resp 18 | Ht 70.0 in | Wt 220.0 lb

## 2019-08-06 DIAGNOSIS — M7712 Lateral epicondylitis, left elbow: Secondary | ICD-10-CM

## 2019-08-06 NOTE — Progress Notes (Signed)
Patient: Stephen Ray Male    DOB: 10-Jul-1966   53 y.o.   MRN: [id number] Visit Date: 08/06/2019  Today's Provider: Vernie Murders, PA   Chief Complaint  Patient presents with  . Elbow Injury   Subjective:     Arm Injury  The incident occurred more than 1 week ago. The incident occurred at work (Works for [employer]). The injury mechanism was repetitive motion. The pain is present in the left elbow. The quality of the pain is described as shooting and aching. The pain does not radiate. The pain is at a severity of 6/10. The pain is mild. The pain has been intermittent since the incident. The symptoms are aggravated by movement and lifting. He has tried ice, rest, heat and NSAIDs for the symptoms. The treatment provided mild relief.    Allergies  Allergen Reactions  . Shellfish Allergy Anaphylaxis  . Shellfish-Derived Products Anaphylaxis     Current Outpatient Medications:  .  amLODipine (NORVASC) 10 MG tablet, Take 1 tablet by mouth once daily, Disp: 90 tablet, Rfl: 0 .  cyclobenzaprine (FLEXERIL) 5 MG tablet, Take 1 tablet (5 mg total) by mouth 3 (three) times daily as needed for muscle spasms. (Patient not taking: Reported on 08/06/2019), Disp: 30 tablet, Rfl: 0 .  DULoxetine (CYMBALTA) 30 MG capsule, Take 1 capsule by mouth once daily, Disp: 90 capsule, Rfl: 0 .  gabapentin (NEURONTIN) 300 MG capsule, TAKE ONE CAPSULE BY MOUTH TWICE DAILY AND TAKE TWO CAPSULES BY MOUTH AT BEDTIME, Disp: 120 capsule, Rfl: 0 .  ibuprofen (ADVIL,MOTRIN) 200 MG tablet, Take 600 mg by mouth daily as needed for moderate pain., Disp: , Rfl:  .  lisinopril (ZESTRIL) 40 MG tablet, Take 1 tablet by mouth once daily, Disp: 90 tablet, Rfl: 1  Review of Systems  Social History   Tobacco Use  . Smoking status: Former Research scientist (life sciences)  . Smokeless tobacco: Never Used  . Tobacco comment: quit 18 yrs ago  Substance Use Topics  . Alcohol use: Yes    Alcohol/week: 4.0 standard drinks    Types: 4 Shots of  liquor per week    Comment: Occasionally       Objective:   BP 122/78   Temp (!) 97.2 F (36.2 C) (Temporal)   Resp 18   Ht 5\' 10"  (1.778 m)   Wt 220 lb (99.8 kg)   SpO2 97%   BMI 31.57 kg/m  Vitals:   08/06/19 1024  BP: 122/78  Resp: 18  Temp: (!) 97.2 F (36.2 C)  TempSrc: Temporal  SpO2: 97%  Weight: 220 lb (99.8 kg)  Height: 5\' 10"  (1.778 m)  Body mass index is 31.57 kg/m.  Physical Exam Constitutional:      General: He is not in acute distress.    Appearance: He is well-developed.  HENT:     Head: Normocephalic and atraumatic.     Right Ear: Hearing normal.     Left Ear: Hearing normal.     Nose: Nose normal.  Eyes:     General: Lids are normal. No scleral icterus.       Right eye: No discharge.        Left eye: No discharge.     Conjunctiva/sclera: Conjunctivae normal.  Pulmonary:     Effort: Pulmonary effort is normal. No respiratory distress.  Musculoskeletal:     Comments: Very sharp pain in the left lateral elbow to fully extend or supinate the hand. Good pulses. Increased  pain to test grip strength with elbow extended. Slight swelling over the lateral epicondyle.  Skin:    Findings: No lesion or rash.  Neurological:     Mental Status: He is alert and oriented to person, place, and time.  Psychiatric:        Speech: Speech normal.        Behavior: Behavior normal.        Thought Content: Thought content normal.        Assessment & Plan    1. Left lateral epicondylitis Onset in July after lifting some rafters. Pain worse in the left lateral elbow with testing grip strength with the elbow extended. No numbness. Recommend Tennis Elbow strap with ice packs and Ibuprofen 200 mg 3 tablets TID with meals. Recheck in 2 weeks if no better. May need Depo-Medrol injection.     Vernie Murders, PA  [facility]

## 2019-08-06 NOTE — Patient Instructions (Signed)
Tennis Elbow    Tennis elbow (lateral epicondylitis) is inflammation of tendons in your outer forearm, near your elbow. Tendons are tissues that connect muscle to bone. When you have tennis elbow, inflammation affects the tendons that you use to bend your wrist and move your hand up. Inflammation occurs in the lower part of the upper arm bone (humerus), where the tendons connect to the bone (lateral epicondyle).  Tennis elbow often affects people who play tennis, but anyone may get the condition from repeatedly extending the wrist or turning the forearm.  What are the causes?  This condition is usually caused by repeatedly extending the wrist, turning the forearm, and using the hands. It can result from sports or work that requires repetitive forearm movements. In some cases, it may be caused by a sudden injury.  What increases the risk?  You are more likely to develop tennis elbow if you play tennis or another racket sport. You also have a higher risk if you frequently use your hands for work. Besides people who play tennis, others at greater risk include:  Musicians.  Carpenters, painters, and plumbers.  Cooks.  Cashiers.  People who work in factories.  Construction workers.  Butchers.  People who use computers.  What are the signs or symptoms?  Symptoms of this condition include:  Pain and tenderness in the forearm and the outer part of the elbow. Pain may be felt only when using the arm, or it may be there all the time.  A burning feeling that starts in the elbow and spreads down the forearm.  A weak grip in the hand.  How is this diagnosed?  This condition may be diagnosed based on:  Your symptoms and medical history.  A physical exam.  X-rays.  MRI.  How is this treated?  Resting and icing your arm is often the first treatment. Your health care provider may also recommend:  Medicines to reduce pain and inflammation. These may be in the form of a pill, topical gels, or shots of a steroid medicine  (cortisone).  An elbow strap to reduce stress on the area.  Physical therapy. This may include massage or exercises.  An elbow brace to restrict the movements that cause symptoms.  If these treatments do not help relieve your symptoms, your health care provider may recommend surgery to remove damaged muscle and reattach healthy muscle to bone.  Follow these instructions at home:  Activity  Rest your elbow and wrist and avoid activities that cause symptoms, as told by your health care provider.  Do physical therapy exercises as instructed.  If you lift an object, lift it with your palm facing up. This reduces stress on your elbow.  Lifestyle  If your tennis elbow is caused by sports, check your equipment and make sure that:  You are using it correctly.  It is the best fit for you.  If your tennis elbow is caused by work or computer use, take frequent breaks to stretch your arm. Talk with your manager about ways to manage your condition at work.  If you have a brace:  Wear the brace or strap as told by your health care provider. Remove it only as told by your health care provider.  Loosen the brace if your fingers tingle, become numb, or turn cold and blue.  Keep the brace clean.  If the brace is not waterproof, ask if you may remove it for bathing. If you must keep the brace on while bathing:    bath or a shower. General instructions   If directed, put ice on the painful area: ? Put ice in a plastic bag. ? Place a towel between your skin and the bag. ? Leave the ice on for 20 minutes, 2-3 times a day.  Take over-the-counter and prescription medicines only as told by your health care provider.  Keep all follow-up visits as told by your health care provider. This is important. Contact a health care provider if:  You have pain that gets worse or does not get better with treatment.   You have numbness or weakness in your forearm, hand, or fingers. Summary  Tennis elbow (lateral epicondylitis) is inflammation of tendons in your outer forearm, near your elbow.  Common symptoms include pain and tenderness in your forearm and the outer part of your elbow.  This condition is usually caused by repeatedly extending your wrist, turning your forearm, and using your hands.  The first treatment is often resting and icing your arm to relieve symptoms. Further treatment may include taking medicine, getting physical therapy, wearing a brace or strap, or having surgery. This information is not intended to replace advice given to you by your health care provider. Make sure you discuss any questions you have with your health care provider. Document Released: 09/05/2005 Document Revised: 06/01/2018 Document Reviewed: 06/20/2017 Elsevier Patient Education  2020 Elsevier Inc.  

## 2019-08-25 ENCOUNTER — Other Ambulatory Visit: Payer: Self-pay | Admitting: Family Medicine

## 2019-08-25 DIAGNOSIS — I10 Essential (primary) hypertension: Secondary | ICD-10-CM

## 2019-08-25 DIAGNOSIS — F419 Anxiety disorder, unspecified: Secondary | ICD-10-CM

## 2019-09-05 ENCOUNTER — Other Ambulatory Visit: Payer: Self-pay | Admitting: Family Medicine

## 2019-09-05 DIAGNOSIS — M792 Neuralgia and neuritis, unspecified: Secondary | ICD-10-CM

## 2019-09-23 ENCOUNTER — Other Ambulatory Visit: Payer: Self-pay | Admitting: Family Medicine

## 2019-10-11 ENCOUNTER — Other Ambulatory Visit: Payer: Self-pay | Admitting: Family Medicine

## 2019-10-11 DIAGNOSIS — M792 Neuralgia and neuritis, unspecified: Secondary | ICD-10-CM

## 2019-11-15 ENCOUNTER — Other Ambulatory Visit: Payer: Self-pay | Admitting: Family Medicine

## 2019-11-15 DIAGNOSIS — M792 Neuralgia and neuritis, unspecified: Secondary | ICD-10-CM

## 2019-11-15 NOTE — Telephone Encounter (Signed)
Requested Prescriptions  Pending Prescriptions Disp Refills  . gabapentin (NEURONTIN) 300 MG capsule [Pharmacy Med Name: Gabapentin 300 MG Oral Capsule] 120 capsule 0    Sig: TAKE 1 CAPSULE BY MOUTH TWICE DAILY AND 2 AT BEDTIME     Neurology: Anticonvulsants - gabapentin Passed - 11/15/2019  9:00 AM      Passed - Valid encounter within last 12 months    Recent Outpatient Visits          3 months ago Left lateral epicondylitis   [place], [place]   7 months ago Generalized body aches   [facility], [place] Muff, [place]   9 months ago [place], [place], [place]   1 year ago Hyperglycemia   [place], [place]   1 year ago Acute right-sided low back pain with right-sided sciatica   [place], [place]

## 2019-11-25 ENCOUNTER — Other Ambulatory Visit: Payer: Self-pay | Admitting: Family Medicine

## 2019-11-25 DIAGNOSIS — F419 Anxiety disorder, unspecified: Secondary | ICD-10-CM

## 2019-12-04 ENCOUNTER — Other Ambulatory Visit: Payer: Self-pay | Admitting: General Surgery

## 2019-12-05 ENCOUNTER — Other Ambulatory Visit: Payer: Self-pay | Admitting: Family Medicine

## 2019-12-05 DIAGNOSIS — I10 Essential (primary) hypertension: Secondary | ICD-10-CM

## 2019-12-09 ENCOUNTER — Other Ambulatory Visit: Payer: BC Managed Care – PPO

## 2019-12-11 ENCOUNTER — Other Ambulatory Visit: Payer: BC Managed Care – PPO

## 2019-12-18 ENCOUNTER — Inpatient Hospital Stay: Admission: RE | Admit: 2019-12-18 | Payer: BC Managed Care – PPO | Source: Ambulatory Visit

## 2019-12-22 ENCOUNTER — Other Ambulatory Visit: Payer: Self-pay | Admitting: Family Medicine

## 2019-12-22 NOTE — Telephone Encounter (Signed)
Requested medication (s) are due for refill today: yes  Requested medication (s) are on the active medication list: yes  Last refill:  09/23/19  Future visit scheduled: no  Notes to clinic:  prescription expired 12/11/19   Requested Prescriptions  Pending Prescriptions Disp Refills   lisinopril (ZESTRIL) 40 MG tablet [Pharmacy Med Name: Lisinopril 40 MG Oral Tablet] 90 tablet 0    Sig: Take 1 tablet by mouth once daily      Cardiovascular:  ACE Inhibitors Failed - 12/22/2019  9:07 AM      Failed - Cr in normal range and within 180 days    Creat  Date Value Ref Range Status  08/09/2017 0.92 0.70 - 1.33 mg/dL Final    Comment:    For patients >32 years of age, the reference limit for Creatinine is approximately 13% higher for people identified as African-American. .    Creatinine, Ser  Date Value Ref Range Status  04/24/2019 0.85 0.61 - 1.24 mg/dL Final          Failed - K in normal range and within 180 days    Potassium  Date Value Ref Range Status  04/24/2019 3.5 3.5 - 5.1 mmol/L Final  12/26/2014 3.4 (L) mmol/L Final    Comment:    3.5-5.1 NOTE: New Reference Range  11/25/14           Passed - Patient is not pregnant      Passed - Last BP in normal range    BP Readings from Last 1 Encounters:  08/06/19 122/78          Passed - Valid encounter within last 6 months    Recent Outpatient Visits           4 months ago Left lateral epicondylitis   [place], [place]   9 months ago Generalized body aches   [facility], [place], [place]   11 months ago [place], Vickki Muff, [place]   1 year ago Hyperglycemia   [place], [place]   1 year ago Acute right-sided low back pain with right-sided sciatica   [place], [place]

## 2019-12-24 ENCOUNTER — Encounter
Admission: RE | Admit: 2019-12-24 | Discharge: 2019-12-24 | Disposition: A | Discharge status: Home / Self Care | Payer: BC Managed Care – PPO | Source: Ambulatory Visit | Attending: General Surgery | Admitting: General Surgery

## 2019-12-24 ENCOUNTER — Other Ambulatory Visit: Payer: Self-pay

## 2019-12-24 NOTE — Patient Instructions (Signed)
Your pre-op EKG and COVID test are scheduled on: Wednesday 01/01/2020 @ 11:00 am. Check in at the [facility] main entrance.   Your procedure is scheduled on: Friday 01/03/2020 Report to Same Day Surgery [facility] elevator to 2nd floor.  Check in with surgery information desk.) To find out your arrival time, call [phone] 1:00-3:00 PM on Thursday 01/02/2020  Remember: Instructions that are not followed completely may result in serious medical risk, up to and including death, or upon the discretion of your surgeon and anesthesiologist your surgery may need to be rescheduled.   __x__ 1. Do not eat food (including mints, candies, chewing gum) after midnight the night before your procedure. You may drink clear liquids up to 2 hours before you are scheduled to arrive at the hospital for your procedure.  Do not drink anything within 2 hours of your scheduled arrival to the hospital.  Approved clear liquids:  --Water or Apple juice without pulp  --Clear carbohydrate beverage such as Gatorade or Powerade  --Black Coffee or Clear Tea (No milk, no creamers, do not add anything to the coffee or tea)   __x__ 2. No alcohol or smoking for 24 hours before or after surgery.  __x__ 3. Notify your doctor if there is any change in your medical condition (cold, fever, infections).  __x__ 4. On the morning of surgery brush your teeth with toothpaste and water.  You may rinse your mouth with mouthwash if you wish.  Do not swallow any toothpaste or mouthwash.  Please read over the following fact sheets attached: [facility] Preparing for Surgery   __x__ Use CHG Soap provided as directed on instruction sheet.  Do not wear jewelry, lotions, powders, deodorant, or perfumes on the day of surgery. Do not shave below the face/neck 48 hours prior to surgery.  Do not bring valuables to the hospital.  [facility] is not responsible for any belongings or valuables.   Eyeglasses may not be worn  into surgery. For patients discharged on the day of surgery, you will NOT be permitted to drive yourself home.  You must have a responsible adult with you for 24 hours after surgery.  __x__ Take these medicines on the morning of surgery with a SMALL SIP OF WATER:  1. Amlodipine (Norvasc)  2. Duloxetine (Cymbalta)  3. Gabapentin (Neurontin)  __x__ SKIP your morning dose of Lisinopril (Zestril) only on the day of surgery.  You do not need to stop this ahead of time.  _n/a_ Follow recommendations from Cardiologist, Pulmonologist or PCP regarding stopping blood thinners such as Coumadin, Plavix, Eliquis, Effient, Pradaxa, and Pletal.  __x__ STARTING 7 days prior to surgery: Do not take any anti-inflammatories such as Advil, Ibuprofen, Motrin, Aleve, Naproxen, Naprosyn, BC/Goodies powders or aspirin products. You may take Tylenol if needed.   __x__ Do not take any over the counter herbal supplements or vitamins until after surgery.   Reviewed instructions with patient via preop telephone call.  Patient verbalized understanding, and will receive copy of printed instructions on date of EKG/COVID visit.

## 2019-12-27 ENCOUNTER — Other Ambulatory Visit: Payer: Self-pay | Admitting: Family Medicine

## 2019-12-27 DIAGNOSIS — M792 Neuralgia and neuritis, unspecified: Secondary | ICD-10-CM

## 2020-01-01 ENCOUNTER — Other Ambulatory Visit
Admission: RE | Admit: 2020-01-01 | Discharge: 2020-01-01 | Disposition: A | Discharge status: Home / Self Care | Payer: BC Managed Care – PPO | Source: Ambulatory Visit | Attending: General Surgery | Admitting: General Surgery

## 2020-01-01 ENCOUNTER — Other Ambulatory Visit: Payer: BC Managed Care – PPO

## 2020-01-01 ENCOUNTER — Other Ambulatory Visit: Payer: Self-pay

## 2020-01-01 DIAGNOSIS — I10 Essential (primary) hypertension: Secondary | ICD-10-CM | POA: Insufficient documentation

## 2020-01-01 DIAGNOSIS — Z20822 Contact with and (suspected) exposure to covid-19: Secondary | ICD-10-CM | POA: Diagnosis not present

## 2020-01-01 DIAGNOSIS — Z01818 Encounter for other preprocedural examination: Secondary | ICD-10-CM | POA: Diagnosis present

## 2020-01-01 LAB — SARS CORONAVIRUS 2 (TAT 6-24 HRS): SARS Coronavirus 2: NEGATIVE

## 2020-01-03 ENCOUNTER — Ambulatory Visit: Payer: BC Managed Care – PPO | Admitting: Registered Nurse

## 2020-01-03 ENCOUNTER — Ambulatory Visit
Admission: RE | Admit: 2020-01-03 | Discharge: 2020-01-03 | Disposition: A | Discharge status: Home / Self Care | Payer: BC Managed Care – PPO | Attending: General Surgery | Admitting: General Surgery

## 2020-01-03 ENCOUNTER — Other Ambulatory Visit: Payer: Self-pay

## 2020-01-03 ENCOUNTER — Encounter: Payer: Self-pay | Admitting: General Surgery

## 2020-01-03 ENCOUNTER — Encounter
Admission: RE | Disposition: A | Discharge status: Home / Self Care | Payer: Self-pay | Source: Home / Self Care | Attending: General Surgery

## 2020-01-03 DIAGNOSIS — M1990 Unspecified osteoarthritis, unspecified site: Secondary | ICD-10-CM | POA: Diagnosis not present

## 2020-01-03 DIAGNOSIS — Z806 Family history of leukemia: Secondary | ICD-10-CM | POA: Diagnosis not present

## 2020-01-03 DIAGNOSIS — F419 Anxiety disorder, unspecified: Secondary | ICD-10-CM | POA: Insufficient documentation

## 2020-01-03 DIAGNOSIS — I10 Essential (primary) hypertension: Secondary | ICD-10-CM | POA: Diagnosis not present

## 2020-01-03 DIAGNOSIS — Z96611 Presence of right artificial shoulder joint: Secondary | ICD-10-CM | POA: Insufficient documentation

## 2020-01-03 DIAGNOSIS — Z87891 Personal history of nicotine dependence: Secondary | ICD-10-CM | POA: Insufficient documentation

## 2020-01-03 DIAGNOSIS — Z87442 Personal history of urinary calculi: Secondary | ICD-10-CM | POA: Insufficient documentation

## 2020-01-03 DIAGNOSIS — Z91041 Radiographic dye allergy status: Secondary | ICD-10-CM | POA: Insufficient documentation

## 2020-01-03 DIAGNOSIS — D171 Benign lipomatous neoplasm of skin and subcutaneous tissue of trunk: Secondary | ICD-10-CM | POA: Insufficient documentation

## 2020-01-03 DIAGNOSIS — Z791 Long term (current) use of non-steroidal anti-inflammatories (NSAID): Secondary | ICD-10-CM | POA: Diagnosis not present

## 2020-01-03 DIAGNOSIS — Z96612 Presence of left artificial shoulder joint: Secondary | ICD-10-CM | POA: Diagnosis not present

## 2020-01-03 DIAGNOSIS — Z91013 Allergy to seafood: Secondary | ICD-10-CM | POA: Diagnosis not present

## 2020-01-03 DIAGNOSIS — Z823 Family history of stroke: Secondary | ICD-10-CM | POA: Diagnosis not present

## 2020-01-03 DIAGNOSIS — Z79899 Other long term (current) drug therapy: Secondary | ICD-10-CM | POA: Diagnosis not present

## 2020-01-03 HISTORY — PX: LIPOMA EXCISION: SHX5283

## 2020-01-03 SURGERY — EXCISION LIPOMA
Anesthesia: General | Site: Axilla | Laterality: Right

## 2020-01-03 MED ORDER — ALBUTEROL SULFATE HFA 108 (90 BASE) MCG/ACT IN AERS
INHALATION_SPRAY | RESPIRATORY_TRACT | Status: DC | PRN
  Administered 2020-01-03: 4 via RESPIRATORY_TRACT

## 2020-01-03 MED ORDER — EPINEPHRINE PF 1 MG/ML IJ SOLN
INTRAMUSCULAR | Status: AC
  Filled 2020-01-03: qty 1

## 2020-01-03 MED ORDER — LIDOCAINE HCL (CARDIAC) PF 100 MG/5ML IV SOSY
PREFILLED_SYRINGE | INTRAVENOUS | Status: DC | PRN
  Administered 2020-01-03: 100 mg via INTRAVENOUS

## 2020-01-03 MED ORDER — ONDANSETRON HCL 4 MG/2ML IJ SOLN: 4.0000 mg | Freq: Once | INTRAMUSCULAR | Status: DC | PRN

## 2020-01-03 MED ORDER — MIDAZOLAM HCL 2 MG/2ML IJ SOLN
INTRAMUSCULAR | Status: AC
  Filled 2020-01-03: qty 2

## 2020-01-03 MED ORDER — FENTANYL CITRATE (PF) 100 MCG/2ML IJ SOLN
25.0000 ug | INTRAMUSCULAR | Status: DC | PRN
  Administered 2020-01-03: 50 ug via INTRAVENOUS

## 2020-01-03 MED ORDER — ALBUTEROL SULFATE HFA 108 (90 BASE) MCG/ACT IN AERS
INHALATION_SPRAY | RESPIRATORY_TRACT | Status: AC
  Filled 2020-01-03: qty 6.7

## 2020-01-03 MED ORDER — DEXAMETHASONE SODIUM PHOSPHATE 10 MG/ML IJ SOLN
INTRAMUSCULAR | Status: AC
  Filled 2020-01-03: qty 1

## 2020-01-03 MED ORDER — CEFAZOLIN SODIUM-DEXTROSE 2-4 GM/100ML-% IV SOLN
2.0000 g | INTRAVENOUS | Status: AC
  Administered 2020-01-03: 2 g via INTRAVENOUS

## 2020-01-03 MED ORDER — FENTANYL CITRATE (PF) 100 MCG/2ML IJ SOLN
INTRAMUSCULAR | Status: AC
  Administered 2020-01-03: 16:00:00 50 ug via INTRAVENOUS
  Filled 2020-01-03: qty 2

## 2020-01-03 MED ORDER — KETOROLAC TROMETHAMINE 30 MG/ML IJ SOLN
INTRAMUSCULAR | Status: AC
  Administered 2020-01-03: 30 mg via INTRAVENOUS
  Filled 2020-01-03: qty 1

## 2020-01-03 MED ORDER — DEXAMETHASONE SODIUM PHOSPHATE 10 MG/ML IJ SOLN
INTRAMUSCULAR | Status: DC | PRN
  Administered 2020-01-03: 8 mg via INTRAVENOUS

## 2020-01-03 MED ORDER — PHENYLEPHRINE HCL (PRESSORS) 10 MG/ML IV SOLN
INTRAVENOUS | Status: DC | PRN
  Administered 2020-01-03: 100 ug via INTRAVENOUS
  Administered 2020-01-03: 50 ug via INTRAVENOUS
  Administered 2020-01-03 (×6): 100 ug via INTRAVENOUS

## 2020-01-03 MED ORDER — FENTANYL CITRATE (PF) 100 MCG/2ML IJ SOLN
INTRAMUSCULAR | Status: DC | PRN
  Administered 2020-01-03: 50 ug via INTRAVENOUS
  Administered 2020-01-03 (×2): 25 ug via INTRAVENOUS

## 2020-01-03 MED ORDER — DEXMEDETOMIDINE HCL IN NACL 80 MCG/20ML IV SOLN
INTRAVENOUS | Status: AC
  Filled 2020-01-03: qty 20

## 2020-01-03 MED ORDER — GLYCOPYRROLATE 0.2 MG/ML IJ SOLN
INTRAMUSCULAR | Status: AC
  Filled 2020-01-03: qty 1

## 2020-01-03 MED ORDER — OXYCODONE HCL 5 MG PO TABS
ORAL_TABLET | ORAL | Status: AC
  Administered 2020-01-03: 16:00:00 5 mg via ORAL
  Filled 2020-01-03: qty 1

## 2020-01-03 MED ORDER — BUPIVACAINE-EPINEPHRINE 0.5% -1:200000 IJ SOLN
INTRAMUSCULAR | Status: DC | PRN
  Administered 2020-01-03: 30 mL

## 2020-01-03 MED ORDER — PROPOFOL 10 MG/ML IV BOLUS
INTRAVENOUS | Status: DC | PRN
  Administered 2020-01-03: 200 mg via INTRAVENOUS
  Administered 2020-01-03: 30 mg via INTRAVENOUS
  Administered 2020-01-03 (×2): 20 mg via INTRAVENOUS
  Administered 2020-01-03: 30 mg via INTRAVENOUS

## 2020-01-03 MED ORDER — LACTATED RINGERS IV SOLN: INTRAVENOUS | Status: DC

## 2020-01-03 MED ORDER — KETOROLAC TROMETHAMINE 30 MG/ML IJ SOLN: 30.0000 mg | Freq: Once | INTRAMUSCULAR | Status: AC

## 2020-01-03 MED ORDER — GLYCOPYRROLATE 0.2 MG/ML IJ SOLN
INTRAMUSCULAR | Status: DC | PRN
  Administered 2020-01-03: .2 mg via INTRAVENOUS

## 2020-01-03 MED ORDER — FAMOTIDINE 20 MG PO TABS
ORAL_TABLET | ORAL | Status: AC
  Administered 2020-01-03: 11:00:00 20 mg via ORAL
  Filled 2020-01-03: qty 1

## 2020-01-03 MED ORDER — OXYCODONE HCL 5 MG PO TABS: 5.0000 mg | ORAL_TABLET | Freq: Once | ORAL | Status: AC | PRN

## 2020-01-03 MED ORDER — MIDAZOLAM HCL 2 MG/2ML IJ SOLN
INTRAMUSCULAR | Status: DC | PRN
  Administered 2020-01-03: 2 mg via INTRAVENOUS

## 2020-01-03 MED ORDER — CEFAZOLIN SODIUM-DEXTROSE 2-4 GM/100ML-% IV SOLN
INTRAVENOUS | Status: AC
  Filled 2020-01-03: qty 100

## 2020-01-03 MED ORDER — HYDROCODONE-ACETAMINOPHEN 5-325 MG PO TABS: 1.0000 | ORAL_TABLET | ORAL | 0 refills | Status: DC | PRN

## 2020-01-03 MED ORDER — FENTANYL CITRATE (PF) 100 MCG/2ML IJ SOLN
INTRAMUSCULAR | Status: AC
  Filled 2020-01-03: qty 2

## 2020-01-03 MED ORDER — ONDANSETRON HCL 4 MG/2ML IJ SOLN
INTRAMUSCULAR | Status: DC | PRN
  Administered 2020-01-03: 4 mg via INTRAVENOUS

## 2020-01-03 MED ORDER — ONDANSETRON HCL 4 MG/2ML IJ SOLN
INTRAMUSCULAR | Status: AC
  Filled 2020-01-03: qty 2

## 2020-01-03 MED ORDER — KETAMINE HCL 10 MG/ML IJ SOLN
INTRAMUSCULAR | Status: DC | PRN
  Administered 2020-01-03 (×4): 10 mg via INTRAVENOUS

## 2020-01-03 MED ORDER — DEXMEDETOMIDINE HCL 200 MCG/2ML IV SOLN
INTRAVENOUS | Status: DC | PRN
  Administered 2020-01-03: 12 ug via INTRAVENOUS
  Administered 2020-01-03: 8 ug via INTRAVENOUS
  Administered 2020-01-03: 20 ug via INTRAVENOUS
  Administered 2020-01-03: 8 ug via INTRAVENOUS

## 2020-01-03 MED ORDER — ACETAMINOPHEN 10 MG/ML IV SOLN
INTRAVENOUS | Status: DC | PRN
  Administered 2020-01-03: 1000 mg via INTRAVENOUS

## 2020-01-03 MED ORDER — OXYCODONE HCL 5 MG/5ML PO SOLN: 5.0000 mg | Freq: Once | ORAL | Status: AC | PRN

## 2020-01-03 MED ORDER — PROPOFOL 10 MG/ML IV BOLUS
INTRAVENOUS | Status: AC
  Filled 2020-01-03: qty 20

## 2020-01-03 MED ORDER — ACETAMINOPHEN 10 MG/ML IV SOLN
INTRAVENOUS | Status: AC
  Filled 2020-01-03: qty 100

## 2020-01-03 MED ORDER — FAMOTIDINE 20 MG PO TABS: 20.0000 mg | ORAL_TABLET | Freq: Once | ORAL | Status: AC

## 2020-01-03 SURGICAL SUPPLY — 35 items
APL PRP STRL LF DISP 70% ISPRP (MISCELLANEOUS) ×1
BULB RESERV EVAC DRAIN JP 100C (MISCELLANEOUS) ×1 IMPLANT
CANISTER SUCT 1200ML W/VALVE (MISCELLANEOUS) ×2 IMPLANT
CHLORAPREP W/TINT 26 (MISCELLANEOUS) ×2 IMPLANT
COVER WAND RF STERILE (DRAPES) ×2 IMPLANT
DRAIN CHANNEL JP 15F RND 16 (MISCELLANEOUS) ×1 IMPLANT
DRAPE LAPAROTOMY TRNSV 106X77 (MISCELLANEOUS) ×2 IMPLANT
DRSG TEGADERM 4X4.75 (GAUZE/BANDAGES/DRESSINGS) ×2 IMPLANT
DRSG TELFA 4X3 1S NADH ST (GAUZE/BANDAGES/DRESSINGS) ×3 IMPLANT
ELECT REM PT RETURN 9FT ADLT (ELECTROSURGICAL) ×2
ELECTRODE REM PT RTRN 9FT ADLT (ELECTROSURGICAL) ×1 IMPLANT
GLOVE BIO SURGEON STRL SZ7.5 (GLOVE) ×2 IMPLANT
GLOVE INDICATOR 8.0 STRL GRN (GLOVE) ×2 IMPLANT
GOWN STRL REUS W/ TWL LRG LVL3 (GOWN DISPOSABLE) ×2 IMPLANT
GOWN STRL REUS W/TWL LRG LVL3 (GOWN DISPOSABLE) ×4
KIT TURNOVER KIT A (KITS) ×2 IMPLANT
LABEL OR SOLS (LABEL) ×2 IMPLANT
MARGIN MAP 10MM (MISCELLANEOUS) ×1 IMPLANT
NDL HYPO 25X1 1.5 SAFETY (NEEDLE) ×1 IMPLANT
NEEDLE HYPO 25X1 1.5 SAFETY (NEEDLE) ×2 IMPLANT
NS IRRIG 500ML POUR BTL (IV SOLUTION) ×2 IMPLANT
PACK BASIN MINOR (MISCELLANEOUS) ×2 IMPLANT
RETRACTOR RING XSMALL (MISCELLANEOUS) IMPLANT
RTRCTR WOUND ALEXIS 13CM XS SH (MISCELLANEOUS) ×2
SHEARS HARMONIC 9CM CVD (BLADE) ×1 IMPLANT
STRIP CLOSURE SKIN 1/2X4 (GAUZE/BANDAGES/DRESSINGS) ×2 IMPLANT
SUT ETHILON 3-0 FS-10 30 BLK (SUTURE) ×4
SUT VIC AB 2-0 CT1 27 (SUTURE) ×2
SUT VIC AB 2-0 CT1 TAPERPNT 27 (SUTURE) ×1 IMPLANT
SUT VIC AB 3-0 54X BRD REEL (SUTURE) ×1 IMPLANT
SUT VIC AB 3-0 BRD 54 (SUTURE)
SUT VIC AB 4-0 PS2 18 (SUTURE) ×2 IMPLANT
SUTURE EHLN 3-0 FS-10 30 BLK (SUTURE) IMPLANT
SWABSTK COMLB BENZOIN TINCTURE (MISCELLANEOUS) ×2 IMPLANT
SYR CONTROL 10ML LL (SYRINGE) ×2 IMPLANT

## 2020-01-03 NOTE — Anesthesia Procedure Notes (Signed)
Procedure Name: LMA Insertion Date/Time: 01/03/2020 1:15 PM Performed by: Lia Foyer, CRNA Pre-anesthesia Checklist: Patient identified, Emergency Drugs available, Suction available and Patient being monitored Patient Re-evaluated:Patient Re-evaluated prior to induction Oxygen Delivery Method: Circle system utilized Preoxygenation: Pre-oxygenation with 100% oxygen Induction Type: IV induction Ventilation: Mask ventilation with difficulty LMA: LMA inserted LMA Size: 4.5 Tube type: Oral Number of attempts: 1 Airway Equipment and Method: Patient positioned with wedge pillow Placement Confirmation: positive ETCO2 and breath sounds checked- equal and bilateral Tube secured with: Tape Dental Injury: Teeth and Oropharynx as per pre-operative assessment

## 2020-01-03 NOTE — Progress Notes (Signed)
Nurse called as the patient report acute onset of right posterior calf tenderness.   Exam:  Calves symmetrical. Soft. Tender proximal gastronemius, no popliteal tenderness. No swelling distal. Pedal pulses intact.  Patient had been supported on a pillow with gel pads on the heel.   No evidence of DVT. He will use heat at home.  Some notable poor control of the upper extremity.  Motor is fine.  Marcaine placed into the axilla at closure. Likely sensory effect.  Instructed about advil: 3 tabs TID- QID.

## 2020-01-03 NOTE — Anesthesia Postprocedure Evaluation (Signed)
Anesthesia Post Note  Patient: Stephen Ray  Procedure(s) Performed: EXCISION LIPOMA (Right Axilla)  Patient location during evaluation: PACU Anesthesia Type: General Level of consciousness: awake and alert Pain management: pain level controlled Vital Signs Assessment: post-procedure vital signs reviewed and stable Respiratory status: spontaneous breathing, nonlabored ventilation, respiratory function stable and patient connected to nasal cannula oxygen Cardiovascular status: blood pressure returned to baseline and stable Postop Assessment: no apparent nausea or vomiting Anesthetic complications: no     Last Vitals:  Vitals:   01/03/20 1535 01/03/20 1550  BP: (!) 148/108 (!) 143/81  Pulse: 96 96  Resp: 13 10  Temp: (!) 36.2 C   SpO2: 95% 95%    Last Pain:  Vitals:   01/03/20 1550  TempSrc:   PainSc: 6                  Arita Miss

## 2020-01-03 NOTE — Transfer of Care (Signed)
Immediate Anesthesia Transfer of Care Note  Patient: Stephen Ray  Procedure(s) Performed: EXCISION LIPOMA (Right Axilla)  Patient Location: PACU  Anesthesia Type:General  Level of Consciousness: drowsy  Airway & Oxygen Therapy: Patient Spontanous Breathing and Patient connected to face mask oxygen  Post-op Assessment: Report given to RN and Post -op Vital signs reviewed and stable  Post vital signs: Reviewed and stable  Last Vitals:  Vitals Value Taken Time  BP 148/108 01/03/20 1536  Temp 36.2 C 01/03/20 1535  Pulse 87 01/03/20 1541  Resp 14 01/03/20 1541  SpO2 98 % 01/03/20 1541  Vitals shown include unvalidated device data.  Last Pain:  Vitals:   01/03/20 1535  TempSrc:   PainSc: 6          Complications: No apparent anesthesia complications

## 2020-01-03 NOTE — Anesthesia Preprocedure Evaluation (Signed)
Anesthesia Evaluation  Patient identified by MRN, date of birth, ID band Patient awake    Reviewed: Allergy & Precautions, NPO status , Patient's Chart, lab work & pertinent test results  History of Anesthesia Complications Negative for: history of anesthetic complications  Airway Mallampati: II       Dental  (+) Teeth Intact, Caps   Pulmonary neg pulmonary ROS, former smoker,    Pulmonary exam normal        Cardiovascular hypertension, Pt. on medications Normal cardiovascular exam     Neuro/Psych Anxiety negative neurological ROS     GI/Hepatic negative GI ROS, Neg liver ROS,   Endo/Other  negative endocrine ROS  Renal/GU stones     Musculoskeletal  (+) Arthritis , Osteoarthritis,    Abdominal Normal abdominal exam  (+)   Peds negative pediatric ROS (+)  Hematology negative hematology ROS (+)   Anesthesia Other Findings Past Medical History: No date: Anxiety No date: Arthritis     Comment:  shoulders (before replacements) No date: Asthma     Comment:  as child No date: Hypertension No date: Kidney stones No date: Motion sickness     Comment:  deep sea fishing No date: Shoulder pain     Comment:  left and right  Reproductive/Obstetrics                             Anesthesia Physical  Anesthesia Plan  ASA: II  Anesthesia Plan: General   Post-op Pain Management:    Induction: Intravenous  PONV Risk Score and Plan:   Airway Management Planned: Oral ETT and LMA  Additional Equipment:   Intra-op Plan:   Post-operative Plan: Extubation in OR  Informed Consent: I have reviewed the patients History and Physical, chart, labs and discussed the procedure including the risks, benefits and alternatives for the proposed anesthesia with the patient or authorized representative who has indicated his/her understanding and acceptance.       Plan Discussed with: CRNA and  Surgeon  Anesthesia Plan Comments:         Anesthesia Quick Evaluation

## 2020-01-03 NOTE — Op Note (Addendum)
Preoperative diagnosis: Recurrent right axillary lipoma.  Postoperative diagnosis: Same with involvement of the axillary vein and thoracodorsal nerve, artery and vein bundle.  Operative procedure: Resection of 4 x 6 x 12  cm recurrent right axillary lipoma.  Operating surgeon: Hervey Ard, MD.  Anesthesia: General by LMA, Marcaine 0.5% with 1: 200,000 units of epinephrine, 30 cc.  Clinical note: This 54 year old male underwent excision of a right axillary atypical lipoma on June 15, 2012.  The lesion was described as being 3 x 11 x 22 cm.  Over the last several years the patient is noted recurrence with fullness in the axilla.  He had residual neural loss with tingling down the anterior medial aspect the left arm and numbness from the left elbow on the dorsal surface of the forearm.  He is brought to the operating at this time for reexcision.  Preprocedure MRI failed to show involvement of the axillary vein.  The patient underwent general anesthesia and tolerated this well.  He received Ancef 2 g intravenously.  The axilla and anterior chest was cleansed with ChloraPrep and draped.  Marcaine was infiltrated for postoperative analgesia.  The distal two thirds of the original excision was extended posteriorly.  The adipose layer was elevated off the mass and a extra small Alexis wound protector placed.  The mass was then carefully dissected through dense scar tissue where it was adherent to the axillary vein.  This began on the anterior superior surface of the vein and rolled all the way to the posterior surface of the vein.  There was a large 3 cm pedicle inferiorly that rolled posteriorly back to the area of the latissimus dorsi muscle.  The medial-inferior aspect was densely adherent to the thoracodorsal nerve artery and vein bundle and this was dissected free.  The nerve appeared to be functional at the end of the procedure.  Excellent hemostasis was achieved with the use of the harmonic  scalpel.  After the lesion was excised a 15-gauge Blake drain was brought out through a separate stab wound incision.  This was anchored in place with a 3-0 nylon suture.  The axillary envelope was closed with a running 2-0 Vicryl suture.  The axillary fat was then closed with a similar suture.  The skin was closed with a running 4-0 Vicryl subcuticular suture.  Benzoin and Steri-Strips followed by Telfa and Tegaderm dressing was applied to both the surgical incision and the drain exit site.  The patient tolerated the procedure well and was taken to recovery room in stable condition.

## 2020-01-03 NOTE — Discharge Instructions (Signed)
AMBULATORY SURGERY  DISCHARGE INSTRUCTIONS   1) The drugs that you were given will stay in your system until tomorrow so for the next 24 hours you should not:  A) Drive an automobile B) Make any legal decisions C) Drink any alcoholic beverage   2) You may resume regular meals tomorrow.  Today it is better to start with liquids and gradually work up to solid foods.  You may eat anything you prefer, but it is better to start with liquids, then soup and crackers, and gradually work up to solid foods.   3) Please notify your doctor immediately if you have any unusual bleeding, trouble breathing, redness and pain at the surgery site, drainage, fever, or pain not relieved by medication.    4) Additional Instructions:        Please contact your physician with any problems or Same Day Surgery at [phone], Monday through Friday 6 am to 4 pm, or [place] at [facility] number at [phone].OK to shower.  Support the drain with a string/ belt to prevent tugging on the dressing.

## 2020-01-03 NOTE — Progress Notes (Signed)
Pt arrived to [facility] post op area, pt c/o severe pain in right calf, both calves equal in measurement  Dr Bary Castilla notified and evaluated pt. Pt instructed to call Dr Bary Castilla if pain in calf increases. Dr Bary Castilla stated pt can go home

## 2020-01-03 NOTE — H&P (Signed)
Stephen Ray [id number] 1966-02-25     HPI:  54 y.o male with prior resection of a right axillary lipoma.  Area has recurred. For re-excision. He has areas of tingling and numbness in the right arm since his last resection.   Medications Prior to Admission  Medication Sig Dispense Refill Last Dose  . Acetaminophen (TYLENOL 8 HOUR ARTHRITIS PAIN PO) Take 650 mg by mouth.   01/03/2020 at Unknown time  . amLODipine (NORVASC) 10 MG tablet Take 1 tablet by mouth once daily (Patient taking differently: Take 10 mg by mouth daily. ) 90 tablet 0 01/03/2020 at Unknown time  . DULoxetine (CYMBALTA) 30 MG capsule Take 1 capsule by mouth once daily (Patient taking differently: Take 30 mg by mouth daily. ) 90 capsule 0 01/02/2020 at Unknown time  . gabapentin (NEURONTIN) 300 MG capsule TAKE 1 CAPSULE BY MOUTH TWICE DAILY AND 2 AT BEDTIME 120 capsule 0 01/02/2020 at Unknown time  . ibuprofen (ADVIL,MOTRIN) 200 MG tablet Take 400 mg by mouth every 6 (six) hours as needed for moderate pain.    12/26/2019  . lisinopril (ZESTRIL) 40 MG tablet Take 1 tablet by mouth once daily 90 tablet 0 01/02/2020 at Unknown time  . cyclobenzaprine (FLEXERIL) 5 MG tablet Take 1 tablet (5 mg total) by mouth 3 (three) times daily as needed for muscle spasms. (Patient not taking: Reported on 08/06/2019) 30 tablet 0    Allergies  Allergen Reactions  . Shellfish Allergy Anaphylaxis  . Shellfish-Derived Products Anaphylaxis   Past Medical History:  Diagnosis Date  . Anxiety   . Arthritis    shoulders (before replacements)  . Asthma    as child  . Hypertension   . Kidney stones   . Motion sickness    deep sea fishing  . Shoulder pain    left and right   Past Surgical History:  Procedure Laterality Date  . CERVICAL FUSION  2001  . COLONOSCOPY WITH PROPOFOL N/A 03/31/2016   Procedure: COLONOSCOPY WITH PROPOFOL;  Surgeon: Lucilla Lame, MD;  Location: [place];  Service: Endoscopy;  Laterality: N/A;  . HERNIA REPAIR   2005  . LIPOMA EXCISION Right 2012   under arm  . LITHOTRIPSY  2002  . POLYPECTOMY  03/31/2016   Procedure: POLYPECTOMY;  Surgeon: Lucilla Lame, MD;  Location: [place];  Service: Endoscopy;;  . SHOULDER HEMI-ARTHROPLASTY Left 08/20/2015   Procedure: SHOULDER HEMI-ARTHROPLASTY;  Surgeon: Corky Mull, MD;  Location: [facility];  Service: Orthopedics;  Laterality: Left;  . SHOULDER SURGERY Right 12/25/2014  . TOTAL SHOULDER ARTHROPLASTY Right    Social History   Socioeconomic History  . Marital status: Married    Spouse name: Not on file  . Number of children: Not on file  . Years of education: Not on file  . Highest education level: Not on file  Occupational History  . Occupation: Museum/gallery curator / Architect (Self employed)  Tobacco Use  . Smoking status: Former Research scientist (life sciences)  . Smokeless tobacco: Never Used  . Tobacco comment: quit 18 yrs ago  Substance and Sexual Activity  . Alcohol use: Yes    Alcohol/week: 4.0 standard drinks    Types: 4 Shots of liquor per week    Comment: Occasionally   . Drug use: No  . Sexual activity: Not on file  Other Topics Concern  . Not on file  Social History Narrative  . Not on file   Social Determinants of Health   Financial Resource Strain:   .  Difficulty of Paying Living Expenses:   Food Insecurity:   . Worried About Charity fundraiser in the Last Year:   . Arboriculturist in the Last Year:   Transportation Needs:   . Film/video editor (Medical):   Marland Kitchen Lack of Transportation (Non-Medical):   Physical Activity:   . Days of Exercise per Week:   . Minutes of Exercise per Session:   Stress:   . Feeling of Stress :   Social Connections:   . Frequency of Communication with Friends and Family:   . Frequency of Social Gatherings with Friends and Family:   . Attends Religious Services:   . Active Member of Clubs or Organizations:   . Attends Archivist Meetings:   Marland Kitchen Marital Status:   Intimate Partner Violence:   . Fear of  Current or Ex-Partner:   . Emotionally Abused:   Marland Kitchen Physically Abused:   . Sexually Abused:    Social History   Social History Narrative  . Not on file     ROS: Negative.     PE: HEENT: Negative. Lungs: Clear. Cardio: RR. Axilla: 6+ cm soft tissue mass.    Assessment/Plan:  Proceed with planned excision right axillary lipoma.   [name] [facility] 01/03/2020

## 2020-01-17 LAB — SURGICAL PATHOLOGY

## 2020-01-18 ENCOUNTER — Other Ambulatory Visit: Payer: Self-pay | Admitting: Family Medicine

## 2020-01-18 DIAGNOSIS — M792 Neuralgia and neuritis, unspecified: Secondary | ICD-10-CM

## 2020-01-18 NOTE — Telephone Encounter (Signed)
Requested Prescriptions  Pending Prescriptions Disp Refills  . gabapentin (NEURONTIN) 300 MG capsule [Pharmacy Med Name: Gabapentin 300 MG Oral Capsule] 360 capsule 1    Sig: TAKE 1 CAPSULE BY MOUTH TWICE DAILY AND 2 AT BEDTIME     Neurology: Anticonvulsants - gabapentin Passed - 01/18/2020 12:53 PM      Passed - Valid encounter within last 12 months    Recent Outpatient Visits          5 months ago Left lateral epicondylitis   [place], [place]   9 months ago Generalized body aches   [facility], Vickki Muff, [place]   12 months ago [place], [place], [place]   1 year ago Hyperglycemia   [place], [place]   1 year ago Acute right-sided low back pain with right-sided sciatica   [place], [place]

## 2020-02-23 ENCOUNTER — Other Ambulatory Visit: Payer: Self-pay | Admitting: Family Medicine

## 2020-02-23 DIAGNOSIS — I10 Essential (primary) hypertension: Secondary | ICD-10-CM

## 2020-02-23 DIAGNOSIS — F419 Anxiety disorder, unspecified: Secondary | ICD-10-CM

## 2020-02-23 NOTE — Telephone Encounter (Signed)
Requested Prescriptions  Pending Prescriptions Disp Refills   DULoxetine (CYMBALTA) 30 MG capsule [Pharmacy Med Name: DULoxetine HCl 30 MG Oral Capsule Delayed Release Particles] 30 capsule 0    Sig: Take 1 capsule by mouth once daily     Psychiatry: Antidepressants - SNRI Failed - 02/23/2020  9:45 AM      Failed - Valid encounter within last 6 months    Recent Outpatient Visits          6 months ago Left lateral epicondylitis   [place], [place]   11 months ago Generalized body aches   [facility], [place], [place]   1 year ago [place], [place], [place]   1 year ago Hyperglycemia   [place], [place], [place]   1 year ago Acute right-sided low back pain with right-sided sciatica   [place], [place]      Future Appointments            In 2 days [place], [place], [place], [place] - Last BP in normal range    BP Readings from Last 1 Encounters:  01/03/20 118/80          amLODipine (NORVASC) 10 MG tablet [Pharmacy Med Name: amLODIPine Besylate 10 MG Oral Tablet] 30 tablet 0    Sig: Take 1 tablet by mouth once daily     Cardiovascular:  Calcium Channel Blockers Failed - 02/23/2020  9:45 AM      Failed - Valid encounter within last 6 months    Recent Outpatient Visits          6 months ago Left lateral epicondylitis   [place], [place]   11 months ago Generalized body aches   [facility], [place], [place]   1 year ago [place], V[place] Muff, [place]   1 year ago Hyperglycemia   [place], [place], [place]   1 year ago Acute right-sided low back pain with right-sided sciatica   [place], [place]      Future Appointments            In 2 days [place], Vickki Muff, [place], [place] BP in normal range    BP Readings from Last 1 Encounters:  01/03/20 118/80

## 2020-02-24 IMAGING — CR CHEST - 2 VIEW
1 series · 2 of 2 positions shown · non-contrast
Comparison: None.

CLINICAL DATA: Recent fall.  Rib pain

EXAM:
CHEST - 2 VIEW

[Series 1: dg chest 2 view · 0.14mm/px · 2 of 2 slices shown]
[im 1/2]
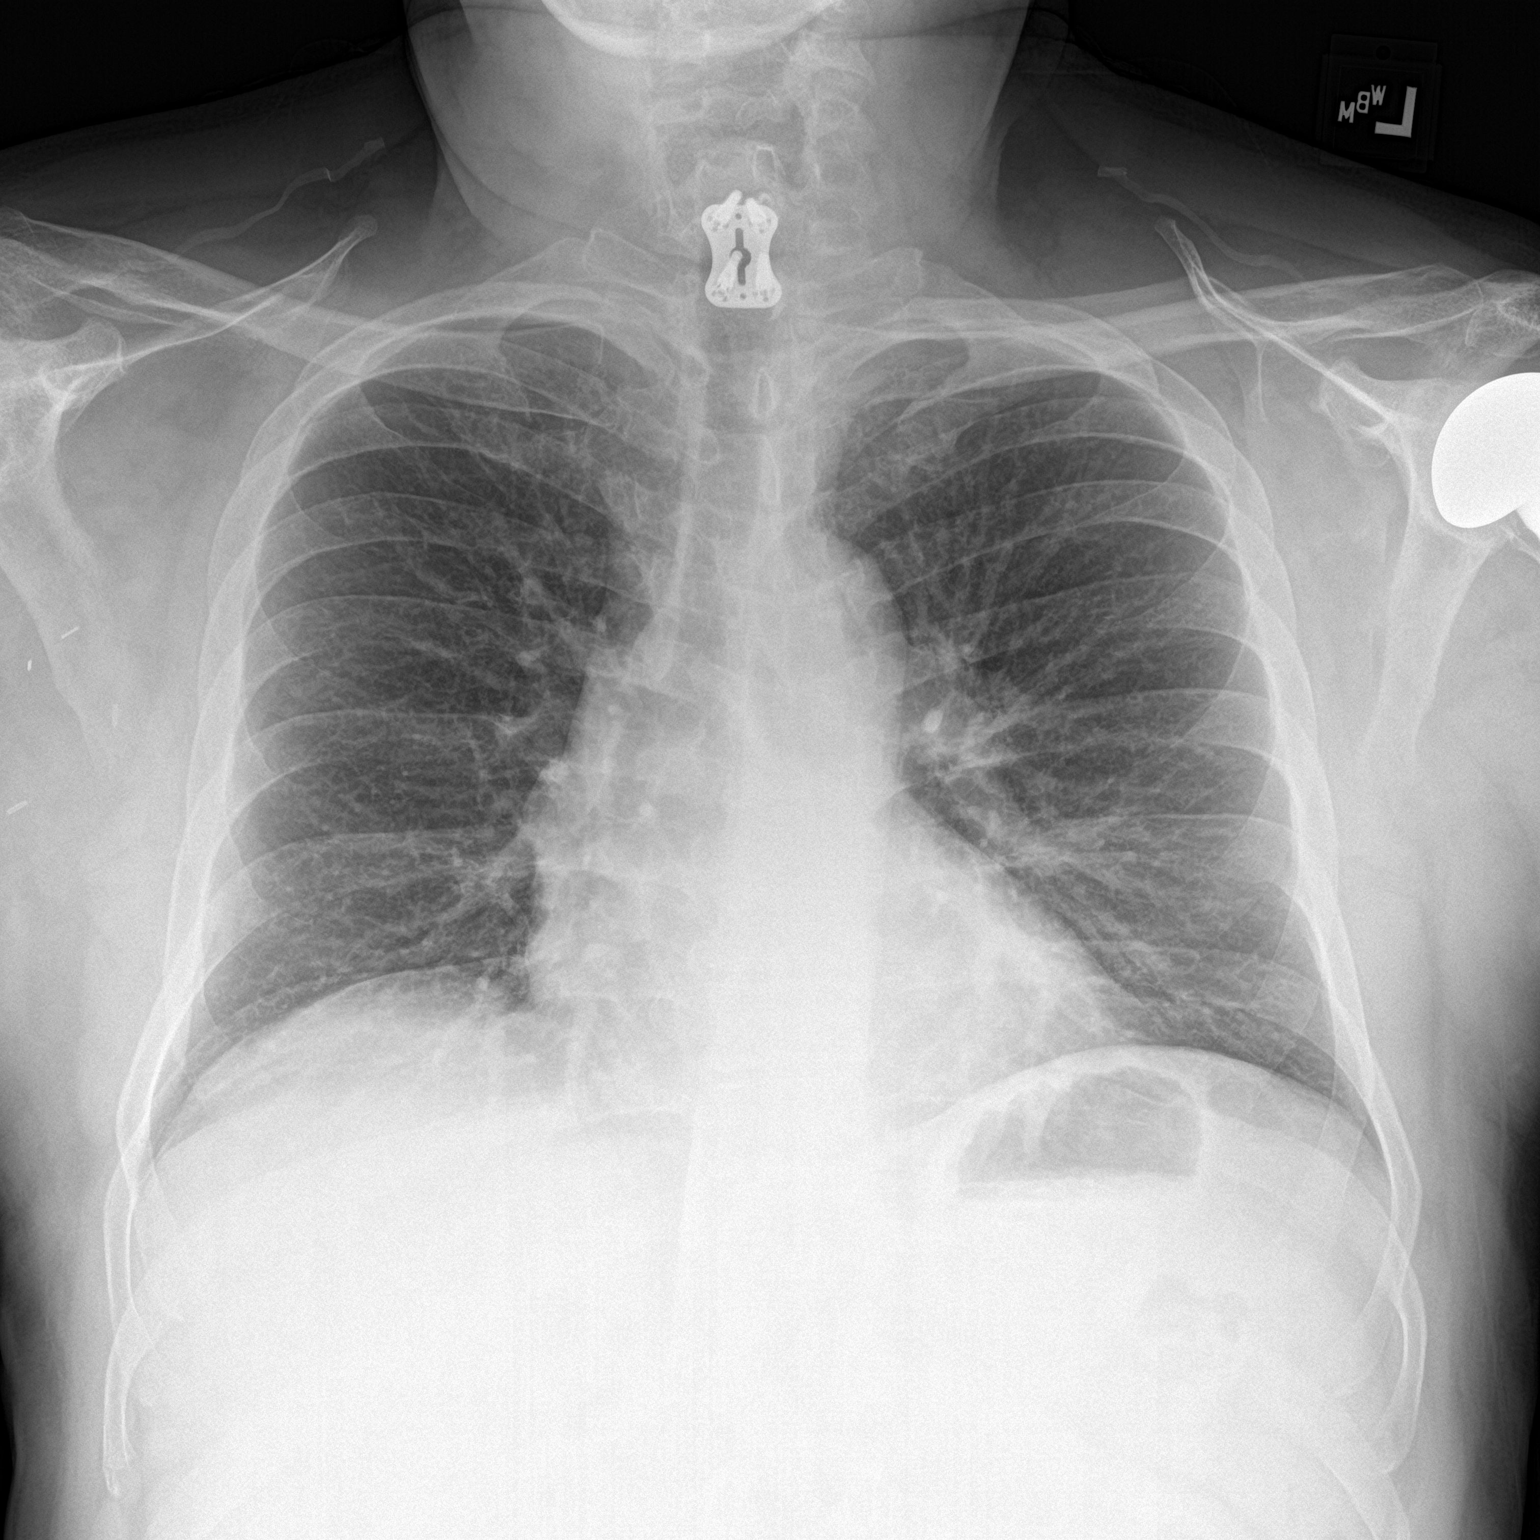
[im 2/2]
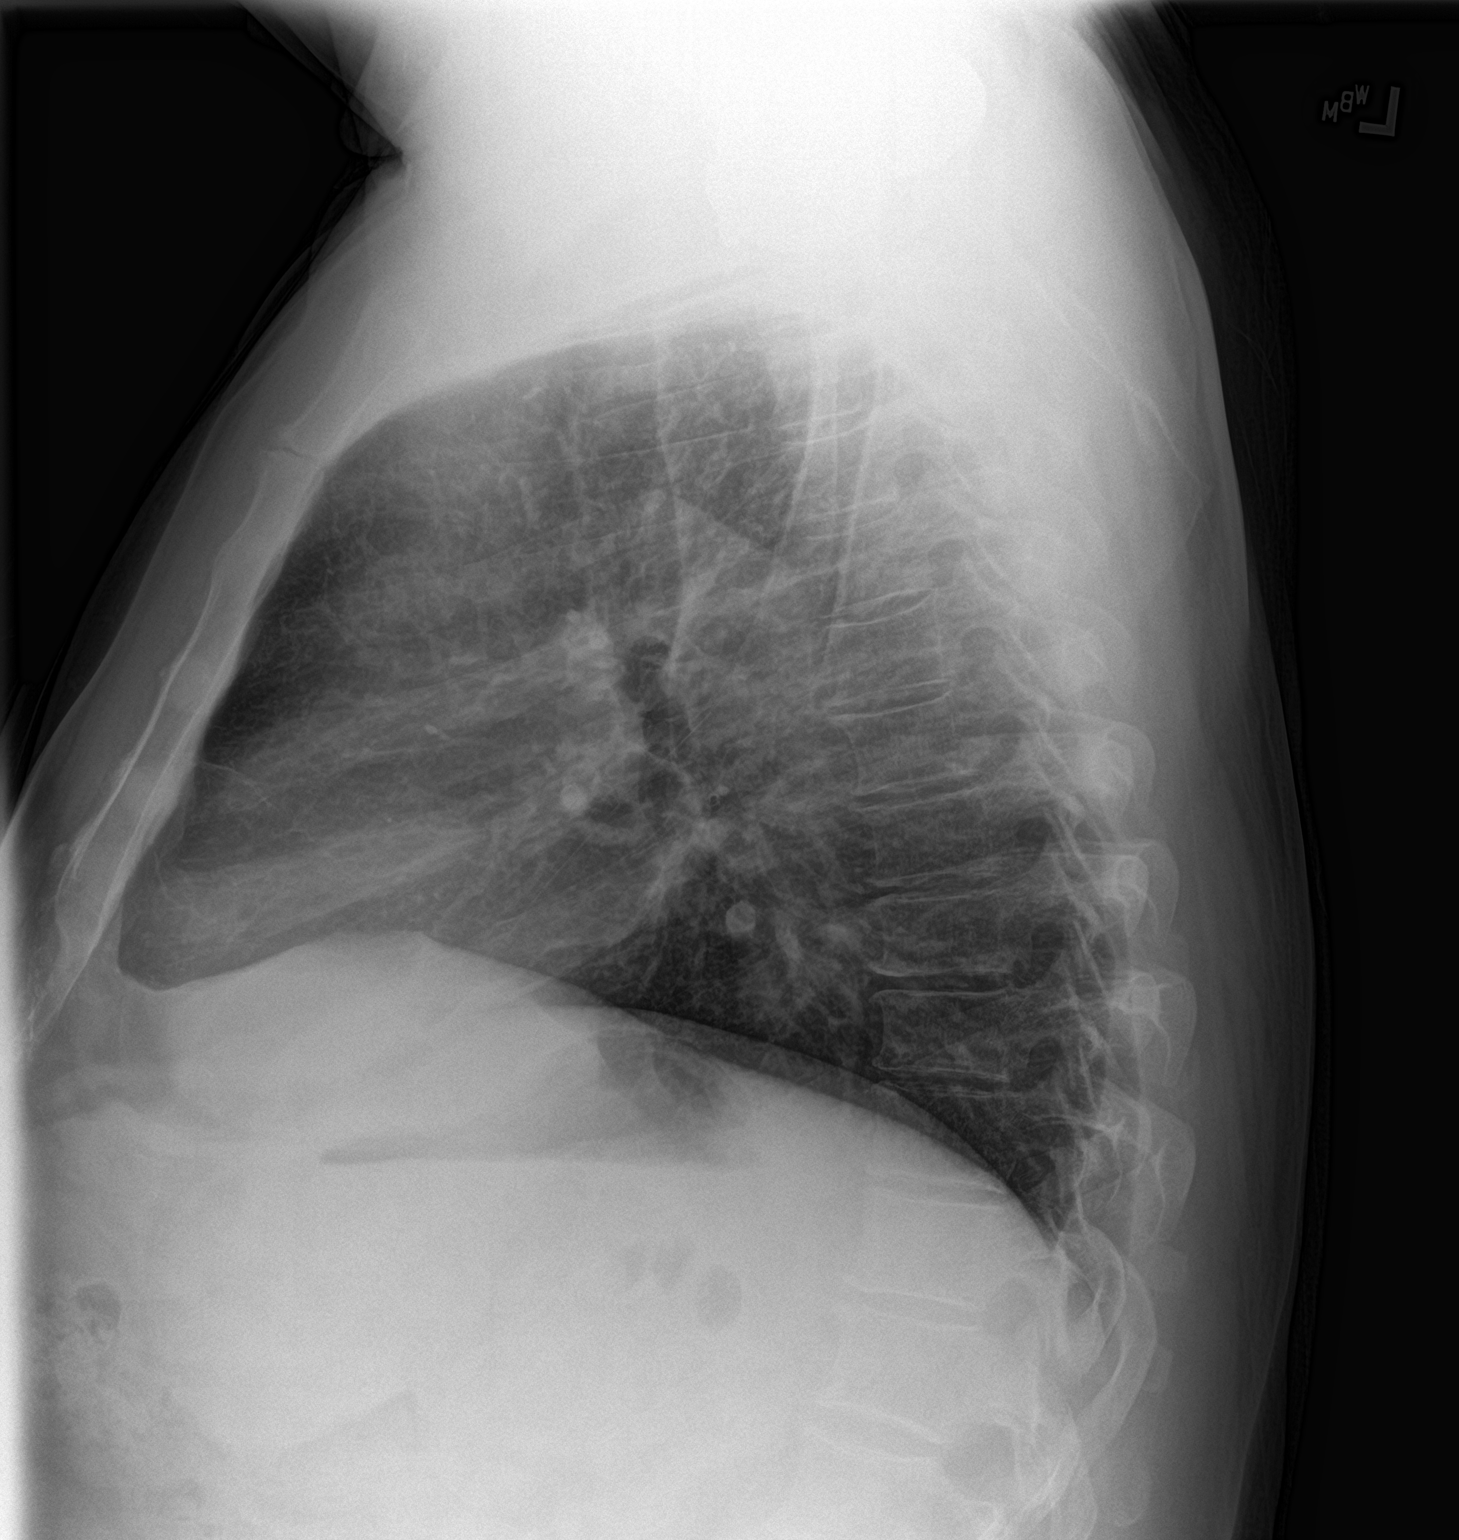

[2 of 2 positions shown; findings below may reference images not displayed]

FINDINGS: Cardiac and mediastinal contours normal. Vascularity normal. Lungs
well aerated and clear. No infiltrate or effusion

ACDF lower cervical spine.  Left shoulder replacement
IMPRESSION: No active cardiopulmonary disease.

## 2020-02-25 ENCOUNTER — Encounter: Payer: Self-pay | Admitting: Family Medicine

## 2020-02-25 ENCOUNTER — Ambulatory Visit (INDEPENDENT_AMBULATORY_CARE_PROVIDER_SITE_OTHER): Payer: BC Managed Care – PPO | Admitting: Family Medicine

## 2020-02-25 ENCOUNTER — Other Ambulatory Visit: Payer: Self-pay

## 2020-02-25 VITALS — BP 150/90 | HR 79 | Temp 97.3°F | Wt 222.0 lb

## 2020-02-25 DIAGNOSIS — I10 Essential (primary) hypertension: Secondary | ICD-10-CM | POA: Diagnosis not present

## 2020-02-25 DIAGNOSIS — Z9889 Other specified postprocedural states: Secondary | ICD-10-CM

## 2020-02-25 DIAGNOSIS — F419 Anxiety disorder, unspecified: Secondary | ICD-10-CM

## 2020-02-25 DIAGNOSIS — M792 Neuralgia and neuritis, unspecified: Secondary | ICD-10-CM | POA: Diagnosis not present

## 2020-02-25 DIAGNOSIS — E781 Pure hyperglyceridemia: Secondary | ICD-10-CM

## 2020-02-25 NOTE — Progress Notes (Signed)
Established patient visit   Patient: Stephen Ray   DOB: 1966/01/24   54 y.o. Male  MRN: [id number] Visit Date: 02/25/2020  Today's healthcare provider: Vernie Murders, PA   Chief Complaint  Patient presents with  . Hypertension   Subjective    HPI  Patient is a 54 year old male who presents for follow up of chronic health.  He was last seen on 08/06/19 for an acute visit.  His last CPE was 08/09/17.  Hypertension, follow-up  BP Readings from Last 3 Encounters:  02/25/20 (!) 150/90  01/03/20 118/80  08/06/19 122/78   Wt Readings from Last 3 Encounters:  02/25/20 222 lb (100.7 kg)  12/24/19 210 lb (95.3 kg)  08/06/19 220 lb (99.8 kg)     Patient takes Amlodipine and Lisinopril for his hypertension.  He reports excellent compliance with treatment. He is not having side effects.  He is following a Regular diet. He is exercising. He does not smoke.  Use of agents associated with hypertension: none.   Outside blood pressures are 120's-140's over 80's.. Symptoms: No chest pain No chest pressure  No palpitations No syncope  No dyspnea No orthopnea  No paroxysmal nocturnal dyspnea No lower extremity edema   Pertinent labs: Lab Results  Component Value Date   CHOL 188 01/22/2019   HDL 45 01/22/2019   LDLCALC 86 01/22/2019   TRIG 287 (H) 01/22/2019   CHOLHDL 4.2 01/22/2019   Lab Results  Component Value Date   NA 140 04/24/2019   K 3.5 04/24/2019   CREATININE 0.85 04/24/2019   GFRNONAA >60 04/24/2019   GFRAA >60 04/24/2019   GLUCOSE 103 (H) 04/24/2019     The 10-year ASCVD risk score Mikey Bussing DC Jr., et al., 2013) is: 8%   Anxiety Follow   Depression screen [facility] 2/9 02/25/2020 08/06/2019 08/09/2017  Decreased Interest 0 0 0  Down, Depressed, Hopeless 0 0 0  PHQ - 2 Score 0 0 0  Altered sleeping 0 - -  Tired, decreased energy 0 - -  Change in appetite 0 - -  Feeling bad or failure about yourself  0 - -  Trouble concentrating 0 - -  Moving slowly  or fidgety/restless 0 - -  Suicidal thoughts 0 - -  PHQ-9 Score 0 - -  Difficult doing work/chores Not difficult at all - -    ----------------------------------------------------------------------------------------- Fall Risk  02/25/2020 03/26/2019 08/09/2017 03/30/2015  Falls in the past year? 0 0 No No     Functional Status Survey: Is the patient deaf or have difficulty hearing?: No Does the patient have difficulty seeing, even when wearing glasses/contacts?: No Does the patient have difficulty concentrating, remembering, or making decisions?: No Does the patient have difficulty walking or climbing stairs?: No Does the patient have difficulty dressing or bathing?: No Does the patient have difficulty doing errands alone such as visiting a doctor's office or shopping?: No     Office Visit from 02/25/2020 in [facility]  AUDIT-C Score  2      Allergies  Allergen Reactions  . Shellfish Allergy Anaphylaxis  . Shellfish-Derived Products Anaphylaxis   Medications: Outpatient Medications Prior to Visit  Medication Sig  . Acetaminophen (TYLENOL 8 HOUR ARTHRITIS PAIN PO) Take 650 mg by mouth.  Marland Kitchen amLODipine (NORVASC) 10 MG tablet Take 1 tablet by mouth once daily  . DULoxetine (CYMBALTA) 30 MG capsule Take 1 capsule by mouth once daily  . gabapentin (NEURONTIN) 300 MG capsule TAKE 1  CAPSULE BY MOUTH TWICE DAILY AND 2 AT BEDTIME  . ibuprofen (ADVIL,MOTRIN) 200 MG tablet Take 400 mg by mouth every 6 (six) hours as needed for moderate pain.   Marland Kitchen lisinopril (ZESTRIL) 40 MG tablet Take 1 tablet by mouth once daily  . [DISCONTINUED] cyclobenzaprine (FLEXERIL) 5 MG tablet Take 1 tablet (5 mg total) by mouth 3 (three) times daily as needed for muscle spasms. (Patient not taking: Reported on 08/06/2019)  . [DISCONTINUED] HYDROcodone-acetaminophen (NORCO/VICODIN) 5-325 MG tablet Take 1 tablet by mouth every 4 (four) hours as needed for moderate pain.   No facility-administered medications  prior to visit.    Review of Systems  Psychiatric/Behavioral: Negative for agitation, behavioral problems, confusion, decreased concentration, dysphoric mood, hallucinations, self-injury, sleep disturbance and suicidal ideas. The patient is not nervous/anxious and is not hyperactive.     Objective    BP (!) 150/90 (BP Location: Left Arm, Patient Position: Sitting, Cuff Size: Normal)   Pulse 79   Temp (!) 97.3 F (36.3 C) (Skin)   Wt 222 lb (100.7 kg)   SpO2 96%   BMI 31.85 kg/m    Physical Exam Constitutional:      General: He is not in acute distress.    Appearance: He is well-developed.  HENT:     Head: Normocephalic and atraumatic.     Right Ear: Hearing normal.     Left Ear: Hearing normal.     Nose: Nose normal.  Eyes:     General: Lids are normal. No scleral icterus.       Right eye: No discharge.        Left eye: No discharge.     Conjunctiva/sclera: Conjunctivae normal.  Cardiovascular:     Rate and Rhythm: Normal rate and regular rhythm.     Pulses: Normal pulses.     Heart sounds: Normal heart sounds.  Pulmonary:     Effort: Pulmonary effort is normal. No respiratory distress.     Breath sounds: Normal breath sounds.  Musculoskeletal:        General: Normal range of motion.     Cervical back: Normal range of motion and neck supple.     Comments: Well healed scars both anterior shoulders and right axilla. Atrophy of the right triceps with zero triceps strength on the right.  Skin:    Findings: No lesion or rash.  Neurological:     Mental Status: He is alert and oriented to person, place, and time.  Psychiatric:        Speech: Speech normal.        Behavior: Behavior normal.        Thought Content: Thought content normal.    No results found for any visits on 02/25/20.    Assessment & Plan     1. Essential (primary) hypertension Slightly elevated BP today. Still taking Amlodipine 10 mg qd with Lisinopril 40 mg qd. No chest pains, peripheral edema or  dyspnea. Labs on 01-08-20 showed normal electrolytes, GFR 88, normal liver enzymes, hgb 13.8, platelets 272,000, glucose 90 with Hgb A1C 5.9%. Will continue present meds and check lipid panel to assess CVD risk. Follow up pending reports.  2. 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 Arm very sensitive to touch and presently on Neurontin 300 mg 1 cap BID and 2 at bedtime. Followed by Dr. Manuella Ghazi (neurologist). Uses Ibuprofen 400 mg QID prn.  All blood tests done by Dr. Manuella Ghazi on 01-08-20 were essentially normal. Continue follow up with him as planned on 03-20-20.  3. S/P rotator cuff repair Had left shoulder total hemiarthroplasty 08-20-15 and right shoulder hemiathtroplasty with curettage/debridement of glenoid cyst 12-25-14 by Dr. Decklin Rack (orthopedist). Well healed with good ROM.  4. Anxiety Stable and less irritability on the Duloxetine 30 mg qd. Some difficulty sleeping with loss of right triceps function and neuropathy. Continue present dosage and follow up in 6 months.  5. Hypertriglyceridemia Encouraged to follow low fat DASH diet and continue Co-Q 10 with Fish Oil supplements. Recheck lipids. - Lipid Panel With LDL/HDL Ratio   No follow-ups on file.      Andres Shad, PA, have reviewed all documentation for this visit. The documentation on 02/25/20 for the exam, diagnosis, procedures, and orders are all accurate and complete.    Vernie Murders, [place] [phone] (phone) [phone] (fax)  [place]

## 2020-02-25 NOTE — Patient Instructions (Signed)
DASH Eating Plan DASH stands for "Dietary Approaches to Stop Hypertension." The DASH eating plan is a healthy eating plan that has been shown to reduce high blood pressure (hypertension). It may also reduce your risk for type 2 diabetes, heart disease, and stroke. The DASH eating plan may also help with weight loss. What are tips for following this plan?  General guidelines  Avoid eating more than 2,300 mg (milligrams) of salt (sodium) a day. If you have hypertension, you may need to reduce your sodium intake to 1,500 mg a day.  Limit alcohol intake to no more than 1 drink a day for nonpregnant women and 2 drinks a day for men. One drink equals 12 oz of beer, 5 oz of wine, or 1 oz of hard liquor.  Work with your health care provider to maintain a healthy body weight or to lose weight. Ask what an ideal weight is for you.  Get at least 30 minutes of exercise that causes your heart to beat faster (aerobic exercise) most days of the week. Activities may include walking, swimming, or biking.  Work with your health care provider or diet and nutrition specialist (dietitian) to adjust your eating plan to your individual calorie needs. Reading food labels   Check food labels for the amount of sodium per serving. Choose foods with less than 5 percent of the Daily Value of sodium. Generally, foods with less than 300 mg of sodium per serving fit into this eating plan.  To find whole grains, look for the word "whole" as the first word in the ingredient list. Shopping  Buy products labeled as "low-sodium" or "no salt added."  Buy fresh foods. Avoid canned foods and premade or frozen meals. Cooking  Avoid adding salt when cooking. Use salt-free seasonings or herbs instead of table salt or sea salt. Check with your health care provider or pharmacist before using salt substitutes.  Do not fry foods. Cook foods using healthy methods such as baking, boiling, grilling, and broiling instead.  Cook with  heart-healthy oils, such as olive, canola, soybean, or sunflower oil. Meal planning  Eat a balanced diet that includes: ? 5 or more servings of fruits and vegetables each day. At each meal, try to fill half of your plate with fruits and vegetables. ? Up to 6-8 servings of whole grains each day. ? Less than 6 oz of lean meat, poultry, or fish each day. A 3-oz serving of meat is about the same size as a deck of cards. One egg equals 1 oz. ? 2 servings of low-fat dairy each day. ? A serving of nuts, seeds, or beans 5 times each week. ? Heart-healthy fats. Healthy fats called Omega-3 fatty acids are found in foods such as flaxseeds and coldwater fish, like sardines, salmon, and mackerel.  Limit how much you eat of the following: ? Canned or prepackaged foods. ? Food that is high in trans fat, such as fried foods. ? Food that is high in saturated fat, such as fatty meat. ? Sweets, desserts, sugary drinks, and other foods with added sugar. ? Full-fat dairy products.  Do not salt foods before eating.  Try to eat at least 2 vegetarian meals each week.  Eat more home-cooked food and less restaurant, buffet, and fast food.  When eating at a restaurant, ask that your food be prepared with less salt or no salt, if possible. What foods are recommended? The items listed may not be a complete list. Talk with your dietitian about   what dietary choices are best for you. Grains Whole-grain or whole-wheat bread. Whole-grain or whole-wheat pasta. Brown rice. Oatmeal. Quinoa. Bulgur. Whole-grain and low-sodium cereals. Pita bread. Low-fat, low-sodium crackers. Whole-wheat flour tortillas. Vegetables Fresh or frozen vegetables (raw, steamed, roasted, or grilled). Low-sodium or reduced-sodium tomato and vegetable juice. Low-sodium or reduced-sodium tomato sauce and tomato paste. Low-sodium or reduced-sodium canned vegetables. Fruits All fresh, dried, or frozen fruit. Canned fruit in natural juice (without  added sugar). Meat and other protein foods Skinless chicken or turkey. Ground chicken or turkey. Pork with fat trimmed off. Fish and seafood. Egg whites. Dried beans, peas, or lentils. Unsalted nuts, nut butters, and seeds. Unsalted canned beans. Lean cuts of beef with fat trimmed off. Low-sodium, lean deli meat. Dairy Low-fat (1%) or fat-free (skim) milk. Fat-free, low-fat, or reduced-fat cheeses. Nonfat, low-sodium ricotta or cottage cheese. Low-fat or nonfat yogurt. Low-fat, low-sodium cheese. Fats and oils Soft margarine without trans fats. Vegetable oil. Low-fat, reduced-fat, or light mayonnaise and salad dressings (reduced-sodium). Canola, safflower, olive, soybean, and sunflower oils. Avocado. Seasoning and other foods Herbs. Spices. Seasoning mixes without salt. Unsalted popcorn and pretzels. Fat-free sweets. What foods are not recommended? The items listed may not be a complete list. Talk with your dietitian about what dietary choices are best for you. Grains Baked goods made with fat, such as croissants, muffins, or some breads. Dry pasta or rice meal packs. Vegetables Creamed or fried vegetables. Vegetables in a cheese sauce. Regular canned vegetables (not low-sodium or reduced-sodium). Regular canned tomato sauce and paste (not low-sodium or reduced-sodium). Regular tomato and vegetable juice (not low-sodium or reduced-sodium). Pickles. Olives. Fruits Canned fruit in a light or heavy syrup. Fried fruit. Fruit in cream or butter sauce. Meat and other protein foods Fatty cuts of meat. Ribs. Fried meat. Bacon. Sausage. Bologna and other processed lunch meats. Salami. Fatback. Hotdogs. Bratwurst. Salted nuts and seeds. Canned beans with added salt. Canned or smoked fish. Whole eggs or egg yolks. Chicken or turkey with skin. Dairy Whole or 2% milk, cream, and half-and-half. Whole or full-fat cream cheese. Whole-fat or sweetened yogurt. Full-fat cheese. Nondairy creamers. Whipped toppings.  Processed cheese and cheese spreads. Fats and oils Butter. Stick margarine. Lard. Shortening. Ghee. Bacon fat. Tropical oils, such as coconut, palm kernel, or palm oil. Seasoning and other foods Salted popcorn and pretzels. Onion salt, garlic salt, seasoned salt, table salt, and sea salt. Worcestershire sauce. Tartar sauce. Barbecue sauce. Teriyaki sauce. Soy sauce, including reduced-sodium. Steak sauce. Canned and packaged gravies. Fish sauce. Oyster sauce. Cocktail sauce. Horseradish that you find on the shelf. Ketchup. Mustard. Meat flavorings and tenderizers. Bouillon cubes. Hot sauce and Tabasco sauce. Premade or packaged marinades. Premade or packaged taco seasonings. Relishes. Regular salad dressings. Where to find more information:  National Heart, Lung, and Blood Institute: www.nhlbi.nih.gov  American Heart Association: www.heart.org Summary  The DASH eating plan is a healthy eating plan that has been shown to reduce high blood pressure (hypertension). It may also reduce your risk for type 2 diabetes, heart disease, and stroke.  With the DASH eating plan, you should limit salt (sodium) intake to 2,300 mg a day. If you have hypertension, you may need to reduce your sodium intake to 1,500 mg a day.  When on the DASH eating plan, aim to eat more fresh fruits and vegetables, whole grains, lean proteins, low-fat dairy, and heart-healthy fats.  Work with your health care provider or diet and nutrition specialist (dietitian) to adjust your eating plan to your   individual calorie needs. This information is not intended to replace advice given to you by your health care provider. Make sure you discuss any questions you have with your health care provider. Document Revised: 08/18/2017 Document Reviewed: 08/29/2016 Elsevier Patient Education  2020 Elsevier Inc.  

## 2020-03-24 ENCOUNTER — Other Ambulatory Visit: Payer: Self-pay | Admitting: Family Medicine

## 2020-03-24 DIAGNOSIS — F419 Anxiety disorder, unspecified: Secondary | ICD-10-CM

## 2020-03-24 NOTE — Telephone Encounter (Signed)
Requested Prescriptions  Pending Prescriptions Disp Refills   DULoxetine (CYMBALTA) 30 MG capsule [Pharmacy Med Name: DULoxetine HCl 30 MG Oral Capsule Delayed Release Particles] 30 capsule 0    Sig: Take 1 capsule by mouth once daily     Psychiatry: Antidepressants - SNRI Failed - 03/24/2020  7:10 PM      Failed - Last BP in normal range    BP Readings from Last 1 Encounters:  02/25/20 (!) 150/90         Passed - Valid encounter within last 6 months    Recent Outpatient Visits          4 weeks ago Essential (primary) hypertension   [facility], [place], [place]   7 months ago Left lateral epicondylitis   [place], [place]   1 year ago Generalized body aches   [facility], [place], [place]   1 year ago [place], [place], [place]   1 year ago Hyperglycemia   [place], [place]              lisinopril (ZESTRIL) 40 MG tablet [Pharmacy Med Name: Lisinopril 40 MG Oral Tablet] 90 tablet 0    Sig: Take 1 tablet by mouth once daily     Cardiovascular:  ACE Inhibitors Failed - 03/24/2020  7:10 PM      Failed - Cr in normal range and within 180 days    Creat  Date Value Ref Range Status  08/09/2017 0.92 0.70 - 1.33 mg/dL Final    Comment:    For patients >59 years of age, the reference limit for Creatinine is approximately 13% higher for people identified as African-American. .    Creatinine, Ser  Date Value Ref Range Status  04/24/2019 0.85 0.61 - 1.24 mg/dL Final         Failed - K in normal range and within 180 days    Potassium  Date Value Ref Range Status  04/24/2019 3.5 3.5 - 5.1 mmol/L Final  12/26/2014 3.4 (L) mmol/L Final    Comment:    3.5-5.1 NOTE: New Reference Range  11/25/14          Failed - Last BP in normal range    BP Readings from Last 1 Encounters:  02/25/20 (!) 150/90         Passed - Patient is not  pregnant      Passed - Valid encounter within last 6 months    Recent Outpatient Visits          4 weeks ago Essential (primary) hypertension   [facility], [place], [place]   7 months ago Left lateral epicondylitis   [facility], Vickki Muff, [place]   1 year ago Generalized body aches   [facility], [place], [place]   1 year ago [place], [place], [place]   1 year ago Hyperglycemia   [place], [place], [place]             Patient seen by provider on 02/25/2020 at which time the plan was to continue current antihypertensive agents as well as Cymbalta and to f/u in 6 months.  Labs from [facility] on 02/07/2020 with normal creatinine of 0.9, serum potassium of 3.6, and GFR of 88.

## 2020-04-02 ENCOUNTER — Other Ambulatory Visit: Payer: Self-pay | Admitting: Family Medicine

## 2020-04-02 DIAGNOSIS — I10 Essential (primary) hypertension: Secondary | ICD-10-CM

## 2020-04-03 ENCOUNTER — Telehealth: Payer: Self-pay | Admitting: Family Medicine

## 2020-04-03 NOTE — Telephone Encounter (Signed)
Pt called and is requesting to speak with PCP. He states that he would like to start taking urozinc and is requesting to know if it will counteract his other medications. Please advise.

## 2020-04-07 NOTE — Telephone Encounter (Signed)
I can't say there is any evidence that it works, but there is nothing harmful in it and is should not interact with any of his medications.

## 2020-04-09 NOTE — Telephone Encounter (Signed)
Patient advised and verbalized understanding 

## 2020-05-21 ENCOUNTER — Other Ambulatory Visit: Payer: Self-pay | Admitting: Family Medicine

## 2020-05-21 DIAGNOSIS — M792 Neuralgia and neuritis, unspecified: Secondary | ICD-10-CM

## 2020-06-19 ENCOUNTER — Encounter: Payer: Self-pay | Admitting: Family Medicine

## 2020-07-06 ENCOUNTER — Other Ambulatory Visit: Payer: Self-pay | Admitting: Family Medicine

## 2020-07-06 DIAGNOSIS — I10 Essential (primary) hypertension: Secondary | ICD-10-CM

## 2020-07-20 HISTORY — PX: NERVE TRANSFER: SHX2084

## 2020-08-05 ENCOUNTER — Telehealth (INDEPENDENT_AMBULATORY_CARE_PROVIDER_SITE_OTHER): Payer: BC Managed Care – PPO | Admitting: Physician Assistant

## 2020-08-05 DIAGNOSIS — Z5321 Procedure and treatment not carried out due to patient leaving prior to being seen by health care provider: Secondary | ICD-10-CM

## 2020-08-05 NOTE — Progress Notes (Signed)
    MyChart Video Visit    Virtual Visit via Video Note   This visit type was conducted due to national recommendations for restrictions regarding the COVID-19 Pandemic (e.g. social distancing) in an effort to limit this patient's exposure and mitigate transmission in our community. This patient is at least at moderate risk for complications without adequate follow up. This format is felt to be most appropriate for this patient at this time. Physical exam was limited by quality of the video and audio technology used for the visit.   Patient location: home Provider location: [facility]  I discussed the limitations of evaluation and management by telemedicine and the availability of in person appointments. The patient expressed understanding and agreed to proceed.  Patient: Stephen Ray   DOB: 12-23-1965   54 y.o. Male  MRN: [id number] Visit Date: 08/05/2020  Today's healthcare provider: Mar Daring, PA-C   Chief Complaint  Patient presents with  . Cough   Subjective    HPI  Called patient to go over his medications and type the note. He said it has been 15 minutes and if I knew how much longer it will be. I told him I couldn't give him a time. He said you know what if I don't feel better by tomorrow I will call my doctor because I can't keep my eyes open. Thank you very much but couldn't wait any longer.     Medications: Outpatient Medications Prior to Visit  Medication Sig  . Acetaminophen (TYLENOL 8 HOUR ARTHRITIS PAIN PO) Take 650 mg by mouth.  Marland Kitchen amLODipine (NORVASC) 10 MG tablet Take 1 tablet by mouth once daily  . DULoxetine (CYMBALTA) 30 MG capsule Take 1 capsule by mouth once daily  . gabapentin (NEURONTIN) 300 MG capsule TAKE 1 CAPSULE BY MOUTH TWICE DAILY AND 2 AT BEDTIME  . ibuprofen (ADVIL,MOTRIN) 200 MG tablet Take 400 mg by mouth every 6 (six) hours as needed for moderate pain.   Marland Kitchen lisinopril (ZESTRIL) 40 MG tablet Take 1 tablet by mouth once daily   No  facility-administered medications prior to visit.    Review of Systems     Objective    There were no vitals taken for this visit.    Physical Exam     Assessment & Plan     Patient left  No follow-ups on file.     I discussed the assessment and treatment plan with the patient. The patient was provided an opportunity to ask questions and all were answered. The patient agreed with the plan and demonstrated an understanding of the instructions.   The patient was advised to call back or seek an in-person evaluation if the symptoms worsen or if the condition fails to improve as anticipated.  I provided 0 minutes of non-face-to-face time during this encounter.  Reynolds Bowl, PA-C, have reviewed all documentation for this visit. The documentation on 08/06/20 for the exam, diagnosis, procedures, and orders are all accurate and complete.  [place] [facility] [phone] (phone) [phone] (fax)  [place]

## 2020-09-04 ENCOUNTER — Other Ambulatory Visit: Payer: Self-pay | Admitting: Family Medicine

## 2020-09-04 DIAGNOSIS — M792 Neuralgia and neuritis, unspecified: Secondary | ICD-10-CM

## 2020-09-04 NOTE — Telephone Encounter (Signed)
Requested Prescriptions  Pending Prescriptions Disp Refills  . gabapentin (NEURONTIN) 300 MG capsule [Pharmacy Med Name: Gabapentin 300 MG Oral Capsule] 360 capsule 1    Sig: TAKE 1 CAPSULE BY MOUTH TWICE DAILY AND 2 CAPSULES AT BEDTIME     Neurology: Anticonvulsants - gabapentin Passed - 09/04/2020  9:14 AM      Passed - Valid encounter within last 12 months    Recent Outpatient Visits          1 month ago Patient left without being seen   [place], PA-C   6 months ago Essential (primary) hypertension   [facility], [place] Muff, PA-C   1 year ago Left lateral epicondylitis   [place], PA-C   1 year ago Generalized body aches   [facility], Vickki Muff, PA-C   1 year ago [place], [place]

## 2020-09-19 ENCOUNTER — Other Ambulatory Visit: Payer: Self-pay | Admitting: Family Medicine

## 2020-09-19 DIAGNOSIS — F419 Anxiety disorder, unspecified: Secondary | ICD-10-CM

## 2020-09-21 ENCOUNTER — Other Ambulatory Visit: Payer: Self-pay | Admitting: Family Medicine

## 2020-09-21 NOTE — Telephone Encounter (Signed)
Requested medication (s) are due for refill today: no  Requested medication (s) are on the active medication list: yes  Last refill:  07/14/2020  Future visit scheduled: no  Notes to clinic:  overdue for follow up appt Patient left without being seen at last appt    Requested Prescriptions  Pending Prescriptions Disp Refills   DULoxetine (CYMBALTA) 30 MG capsule [Pharmacy Med Name: DULoxetine HCl 30 MG Oral Capsule Delayed Release Particles] 90 capsule 0    Sig: Take 1 capsule by mouth once daily      Psychiatry: Antidepressants - SNRI Failed - 09/19/2020 10:12 AM      Failed - Last BP in normal range    BP Readings from Last 1 Encounters:  02/25/20 (!) 150/90          Failed - Valid encounter within last 6 months    Recent Outpatient Visits           1 month ago Patient left without being seen   [facility] [place], Alessandra Bevels, PA-C   6 months ago Essential (primary) hypertension   [facility], Jodell Cipro, PA-C   1 year ago Left lateral epicondylitis    [facility] [place], Jodell Cipro, PA-C   1 year ago Generalized body aches   [facility], Jodell Cipro, PA-C   1 year ago Dysesthesia   [facility], Jodell Cipro, [place]

## 2020-09-21 NOTE — Telephone Encounter (Signed)
refilled 

## 2020-10-01 ENCOUNTER — Ambulatory Visit: Payer: BC Managed Care – PPO | Admitting: Family Medicine

## 2020-10-05 ENCOUNTER — Other Ambulatory Visit: Payer: Self-pay | Admitting: Family Medicine

## 2020-10-05 DIAGNOSIS — I10 Essential (primary) hypertension: Secondary | ICD-10-CM

## 2020-10-06 ENCOUNTER — Encounter: Payer: Self-pay | Admitting: Family Medicine

## 2020-10-06 ENCOUNTER — Telehealth (INDEPENDENT_AMBULATORY_CARE_PROVIDER_SITE_OTHER): Payer: BC Managed Care – PPO | Admitting: Family Medicine

## 2020-10-06 VITALS — BP 138/92

## 2020-10-06 DIAGNOSIS — E781 Pure hyperglyceridemia: Secondary | ICD-10-CM | POA: Diagnosis not present

## 2020-10-06 DIAGNOSIS — I10 Essential (primary) hypertension: Secondary | ICD-10-CM | POA: Diagnosis not present

## 2020-10-06 DIAGNOSIS — S4421XD Injury of radial nerve at upper arm level, right arm, subsequent encounter: Secondary | ICD-10-CM

## 2020-10-06 MED ORDER — OXYCODONE HCL 5 MG PO CAPS
5.0000 mg | ORAL_CAPSULE | Freq: Four times a day (QID) | ORAL | 0 refills | Status: DC | PRN
Start: 2020-10-06 — End: 2020-12-16

## 2020-10-06 NOTE — Progress Notes (Signed)
Virtual telephone visit    Virtual Visit via Telephone Note   This visit type was conducted due to national recommendations for restrictions regarding the COVID-19 Pandemic (e.g. social distancing) in an effort to limit this patient's exposure and mitigate transmission in our community. Due to his co-morbid illnesses, this patient is at least at moderate risk for complications without adequate follow up. This format is felt to be most appropriate for this patient at this time. The patient did not have access to video technology or had technical difficulties with video requiring transitioning to audio format only (telephone). Physical exam was limited to content and character of the telephone converstion.    Patient location: home Provider location: home  I discussed the limitations of evaluation and management by telemedicine and the availability of in person appointments. The patient expressed understanding and agreed to proceed.   Visit Date: 10/06/2020  Today's healthcare provider: Vernie Murders, PA-C   No chief complaint on file.  Subjective    HPI  Hypertension, follow-up  BP Readings from Last 3 Encounters:  02/25/20 (!) 150/90  01/03/20 118/80  08/06/19 122/78   Wt Readings from Last 3 Encounters:  02/25/20 222 lb (100.7 kg)  12/24/19 210 lb (95.3 kg)  08/06/19 220 lb (99.8 kg)     He was last seen for hypertension 7 months ago.  BP at that visit was 150/90. Management since that visit includes none.  Labs were ordered however, patient has not had these done.   He reports good compliance with treatment. He is not having side effects.    Use of agents associated with hypertension: none.   Outside blood pressures are are being checked.  Patient did not give the readings.  He did say that they have been running a little higher than normal due to . Symptoms: No chest pain No chest pressure  No palpitations No syncope  No dyspnea No orthopnea  No paroxysmal  nocturnal dyspnea No lower extremity edema   Pertinent labs: Lab Results  Component Value Date   CHOL 188 01/22/2019   HDL 45 01/22/2019   LDLCALC 86 01/22/2019   TRIG 287 (H) 01/22/2019   CHOLHDL 4.2 01/22/2019   Lab Results  Component Value Date   NA 140 04/24/2019   K 3.5 04/24/2019   CREATININE 0.85 04/24/2019   GFRNONAA >60 04/24/2019   GFRAA >60 04/24/2019   GLUCOSE 103 (H) 04/24/2019     The 10-year ASCVD risk score Mikey Bussing DC Jr., et al., 2013) is: 7%   ---------------------------------------------------------------------------------------------------    Past Medical History:  Diagnosis Date  . Anxiety   . Arthritis    shoulders (before replacements)  . Asthma    as child  . Hypertension   . Kidney stones   . Motion sickness    deep sea fishing  . Shoulder pain    left and right   Past Surgical History:  Procedure Laterality Date  . CERVICAL FUSION  2001  . COLONOSCOPY WITH PROPOFOL N/A 03/31/2016   Procedure: COLONOSCOPY WITH PROPOFOL;  Surgeon: Lucilla Lame, MD;  Location: [facility];  Service: Endoscopy;  Laterality: N/A;  . HERNIA REPAIR  2005  . LIPOMA EXCISION Right 2012   under arm  . LIPOMA EXCISION Right 01/03/2020   Procedure: EXCISION LIPOMA;  Surgeon: Robert Bellow, MD;  Location: [facility];  Service: General;  Laterality: Right;  . LITHOTRIPSY  2002  . POLYPECTOMY  03/31/2016   Procedure: POLYPECTOMY;  Surgeon: Lucilla Lame,  MD;  Location: [facility];  Service: Endoscopy;;  . SHOULDER HEMI-ARTHROPLASTY Left 08/20/2015   Procedure: SHOULDER HEMI-ARTHROPLASTY;  Surgeon: Corky Mull, MD;  Location: [facility];  Service: Orthopedics;  Laterality: Left;  . SHOULDER SURGERY Right 12/25/2014  . TOTAL SHOULDER ARTHROPLASTY Right    Social History   Tobacco Use  . Smoking status: Former Research scientist (life sciences)  . Smokeless tobacco: Never Used  . Tobacco comment: quit 18 yrs ago  Vaping Use  . Vaping Use: Never used  Substance Use Topics  .  Alcohol use: Yes    Alcohol/week: 4.0 standard drinks    Types: 4 Shots of liquor per week    Comment: Occasionally   . Drug use: No   Family History  Problem Relation Age of Onset  . Nephrolithiasis Father   . Cancer Father        Leukemia  . Kidney cancer Neg Hx   . Prostate cancer Neg Hx    Allergies  Allergen Reactions  . Shellfish Allergy Anaphylaxis  . Shellfish-Derived Products Anaphylaxis    Medications: Outpatient Medications Prior to Visit  Medication Sig  . Acetaminophen (TYLENOL 8 HOUR ARTHRITIS PAIN PO) Take 650 mg by mouth.  Marland Kitchen amLODipine (NORVASC) 10 MG tablet Take 1 tablet by mouth once daily  . DULoxetine (CYMBALTA) 30 MG capsule Take 1 capsule by mouth once daily  . gabapentin (NEURONTIN) 300 MG capsule TAKE 1 CAPSULE BY MOUTH TWICE DAILY AND 2 CAPSULES AT BEDTIME  . ibuprofen (ADVIL,MOTRIN) 200 MG tablet Take 400 mg by mouth every 6 (six) hours as needed for moderate pain.   Marland Kitchen lisinopril (ZESTRIL) 40 MG tablet Take 1 tablet by mouth once daily   No facility-administered medications prior to visit.    Review of Systems  Constitutional: Negative.   HENT: Negative.   Respiratory: Negative.   Cardiovascular: Negative.   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       Objective    BP (!) 138/92   During telephonic interview, no acute respiratory distress. Oriented x 3.    Assessment & Plan     1. Essential (primary) hypertension Tolerating the Lisinopril 40 mg qd and Amlodipine 10 mg qd. BP was 138/92 today and went to the lab for follow up blood tests. No side effects. - CBC with Differential/Platelet - Comprehensive metabolic panel - TSH  2. Hypertriglyceridemia Got follow up labs today. Continue to follow a low fat diet. 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 Still on Duloxetine and Gabapentin for chronic nerve pain. Having more pain awaking him from sleep for a couple hours at night. Requests refill of Oxycodone to use rarely prn until his pain management appointment on 10-22-20. - oxycodone (OXY-IR) 5 MG capsule; Take 1 capsule (5 mg total) by mouth every 6 (six) hours as needed.  Dispense: 30 capsule; Refill: 0    No follow-ups on file.    I discussed the assessment and treatment plan with the patient. The patient was provided an opportunity to ask questions and all were answered. The patient agreed with the plan and  demonstrated an understanding of the instructions.   The patient was advised to call back or seek an in-person evaluation if the symptoms worsen or if the condition fails to improve as anticipated.  I provided 22 minutes of non-face-to-face time during this encounter.  I,  , PA-C, have reviewed all documentation for this visit. The documentation on 10/06/20 for the exam, diagnosis, procedures, and orders are all accurate and complete.   Vernie Murders, PA-C [employer] [phone] (phone) [phone] (fax)  [place]

## 2020-10-07 LAB — COMPREHENSIVE METABOLIC PANEL
ALT: 62 IU/L — ABNORMAL HIGH (ref 0–44)
AST: 31 IU/L (ref 0–40)
Albumin/Globulin Ratio: 1.8 (ref 1.2–2.2)
Albumin: 4.8 g/dL (ref 3.8–4.9)
Alkaline Phosphatase: 112 IU/L (ref 44–121)
BUN/Creatinine Ratio: 10 (ref 9–20)
BUN: 10 mg/dL (ref 6–24)
Bilirubin Total: 0.4 mg/dL (ref 0.0–1.2)
CO2: 24 mmol/L (ref 20–29)
Calcium: 9.9 mg/dL (ref 8.7–10.2)
Chloride: 101 mmol/L (ref 96–106)
Creatinine, Ser: 0.97 mg/dL (ref 0.76–1.27)
GFR calc Af Amer: 102 mL/min/{1.73_m2} (ref >59)
GFR calc non Af Amer: 88 mL/min/{1.73_m2} (ref >59)
Globulin, Total: 2.6 g/dL (ref 1.5–4.5)
Glucose: 120 mg/dL — ABNORMAL HIGH (ref 65–99)
Potassium: 3.5 mmol/L (ref 3.5–5.2)
Sodium: 142 mmol/L (ref 134–144)
Total Protein: 7.4 g/dL (ref 6.0–8.5)

## 2020-10-07 LAB — CBC WITH DIFFERENTIAL/PLATELET
Basophils Absolute: 0.1 10*3/uL (ref 0.0–0.2)
Basos: 1 %
EOS (ABSOLUTE): 0.2 10*3/uL (ref 0.0–0.4)
Eos: 2 %
Hematocrit: 42 % (ref 37.5–51.0)
Hemoglobin: 14.9 g/dL (ref 13.0–17.7)
Immature Grans (Abs): 0 10*3/uL (ref 0.0–0.1)
Immature Granulocytes: 0 %
Lymphocytes Absolute: 2.4 10*3/uL (ref 0.7–3.1)
Lymphs: 33 %
MCH: 32.1 pg (ref 26.6–33.0)
MCHC: 35.5 g/dL (ref 31.5–35.7)
MCV: 91 fL (ref 79–97)
Monocytes Absolute: 0.6 10*3/uL (ref 0.1–0.9)
Monocytes: 8 %
Neutrophils Absolute: 3.9 10*3/uL (ref 1.4–7.0)
Neutrophils: 56 %
Platelets: 298 10*3/uL (ref 150–450)
RBC: 4.64 x10E6/uL (ref 4.14–5.80)
RDW: 12.2 % (ref 11.6–15.4)
WBC: 7.1 10*3/uL (ref 3.4–10.8)

## 2020-10-07 LAB — LIPID PANEL WITH LDL/HDL RATIO
Cholesterol, Total: 227 mg/dL — ABNORMAL HIGH (ref 100–199)
HDL: 48 mg/dL (ref >39)
LDL Chol Calc (NIH): 122 mg/dL — ABNORMAL HIGH (ref 0–99)
LDL/HDL Ratio: 2.5 ratio (ref 0.0–3.6)
Triglycerides: 324 mg/dL — ABNORMAL HIGH (ref 0–149)
VLDL Cholesterol Cal: 57 mg/dL — ABNORMAL HIGH (ref 5–40)

## 2020-10-07 LAB — TSH: TSH: 1.3 u[IU]/mL (ref 0.450–4.500)

## 2020-10-08 ENCOUNTER — Telehealth: Payer: Self-pay

## 2020-10-08 DIAGNOSIS — E781 Pure hyperglyceridemia: Secondary | ICD-10-CM

## 2020-10-08 MED ORDER — ATORVASTATIN CALCIUM 40 MG PO TABS: 40.0000 mg | ORAL_TABLET | Freq: Every day | ORAL | 3 refills | Status: DC

## 2020-10-08 NOTE — Telephone Encounter (Signed)
-----   Message from Margo Common, PA-C sent at 10/07/2020  3:21 PM EST ----- Blood tests essentially normal except total cholesterol, LDL cholesterol and triglycerides are high. Blood sugar is borderline and one liver enzyme is increased. If this was a truly fasting blood test, need to be on a low fat diet and Atorvastatin 40 mg qd #90 & 3 RF. If not a fasting test, should recheck after an 8-10 hour fast. Assess levels in 3 months.

## 2020-10-08 NOTE — Telephone Encounter (Signed)
Patient advised and verbalized understanding. He reports he was fasting for these labs. He agrees to start medication. Prescription sent into pharmacy. Follow up appointment scheduled for 01/07/2021.

## 2020-10-13 ENCOUNTER — Encounter: Payer: Self-pay | Admitting: Family Medicine

## 2020-10-13 ENCOUNTER — Other Ambulatory Visit: Payer: Self-pay | Admitting: Family Medicine

## 2020-10-13 DIAGNOSIS — R7309 Other abnormal glucose: Secondary | ICD-10-CM

## 2020-10-21 ENCOUNTER — Other Ambulatory Visit: Payer: Self-pay

## 2020-10-21 ENCOUNTER — Telehealth: Payer: Self-pay

## 2020-10-21 DIAGNOSIS — R7309 Other abnormal glucose: Secondary | ICD-10-CM

## 2020-10-21 DIAGNOSIS — R768 Other specified abnormal immunological findings in serum: Secondary | ICD-10-CM

## 2020-10-21 NOTE — Telephone Encounter (Signed)
Copied from [place] [id number]. Topic: General - Other >> Oct 21, 2020  2:37 PM Leward Quan A wrote: Reason for CRM: Patient called in to inquire of one of the providers if he can come in and be checked for Hep B. Stated that he gave blood 8 yrs ago and was told he have hep B and just gave blood again and was told the same thing. Asking for orders to be placed so he can come in tomorrow and have a Hep B check. Please advise Ph# [phone]

## 2020-10-21 NOTE — Telephone Encounter (Signed)
Labs ordered.

## 2020-10-22 ENCOUNTER — Ambulatory Visit: Payer: BC Managed Care – PPO | Admitting: Student in an Organized Health Care Education/Training Program

## 2020-10-22 LAB — HEMOGLOBIN A1C
Est. average glucose Bld gHb Est-mCnc: 123 mg/dL
Hgb A1c MFr Bld: 5.9 % — ABNORMAL HIGH (ref 4.8–5.6)

## 2020-10-22 NOTE — Telephone Encounter (Signed)
Patient advised.

## 2020-10-23 LAB — HEPATIC FUNCTION PANEL
ALT: 39 IU/L (ref 0–44)
AST: 19 IU/L (ref 0–40)
Albumin: 4.5 g/dL (ref 3.8–4.9)
Alkaline Phosphatase: 104 IU/L (ref 44–121)
Bilirubin Total: 0.4 mg/dL (ref 0.0–1.2)
Bilirubin, Direct: 0.11 mg/dL (ref 0.00–0.40)
Total Protein: 6.9 g/dL (ref 6.0–8.5)

## 2020-10-23 LAB — HEPATITIS PANEL, ACUTE
Hep A IgM: NEGATIVE
Hep B C IgM: NEGATIVE
Hep C Virus Ab: <0.1 s/co ratio (ref 0.0–0.9)
Hepatitis B Surface Ag: NEGATIVE

## 2020-10-23 LAB — HEPATITIS B CORE AB W/REFLEX: Hep B Core Total Ab: POSITIVE — AB

## 2020-11-02 ENCOUNTER — Encounter: Payer: Self-pay | Admitting: Physician Assistant

## 2020-11-02 DIAGNOSIS — B181 Chronic viral hepatitis B without delta-agent: Secondary | ICD-10-CM

## 2020-11-03 DIAGNOSIS — B181 Chronic viral hepatitis B without delta-agent: Secondary | ICD-10-CM | POA: Insufficient documentation

## 2020-11-16 ENCOUNTER — Other Ambulatory Visit: Payer: Self-pay

## 2020-11-16 ENCOUNTER — Other Ambulatory Visit (HOSPITAL_COMMUNITY): Payer: Self-pay | Admitting: Surgery

## 2020-11-16 ENCOUNTER — Other Ambulatory Visit: Payer: Self-pay | Admitting: Surgery

## 2020-11-16 ENCOUNTER — Encounter: Payer: Self-pay | Admitting: Student in an Organized Health Care Education/Training Program

## 2020-11-16 ENCOUNTER — Ambulatory Visit
Payer: BC Managed Care – PPO | Attending: Student in an Organized Health Care Education/Training Program | Admitting: Student in an Organized Health Care Education/Training Program

## 2020-11-16 VITALS — BP 169/95 | HR 84 | Temp 98.0°F | Resp 16 | Ht 70.0 in | Wt 222.0 lb

## 2020-11-16 DIAGNOSIS — M792 Neuralgia and neuritis, unspecified: Secondary | ICD-10-CM | POA: Insufficient documentation

## 2020-11-16 DIAGNOSIS — S5421XS Injury of radial nerve at forearm level, right arm, sequela: Secondary | ICD-10-CM | POA: Insufficient documentation

## 2020-11-16 DIAGNOSIS — G894 Chronic pain syndrome: Secondary | ICD-10-CM

## 2020-11-16 DIAGNOSIS — S83232A Complex tear of medial meniscus, current injury, left knee, initial encounter: Secondary | ICD-10-CM

## 2020-11-16 MED ORDER — PREGABALIN 75 MG PO CAPS
ORAL_CAPSULE | ORAL | 0 refills | Status: DC
Start: 2020-11-16 — End: 2020-12-16

## 2020-11-16 NOTE — Progress Notes (Signed)
Patient: Stephen Ray  Service Category: E/M  Provider: Gillis Santa, MD  DOB: May 01, 1966  DOS: 11/16/2020  Referring Provider: Tania Ray  MRN: [id number]  Setting: Ambulatory outpatient  PCP: Stephen Common, PA-C  Type: New Patient  Specialty: Interventional Pain Management    Location: Office  Delivery: Face-to-face     Primary Reason(s) for Visit: Encounter for initial evaluation of one or more chronic problems (new to examiner) potentially causing chronic pain, and posing a threat to normal musculoskeletal function. (Level of risk: High) CC: Arm Pain (Right   result of a lymphoma removal, provider accidentally severed a nerve.  )  HPI  Stephen Ray is a 55 y.o. year old, male patient, who comes for the first time to our practice referred by Stephen Ray, Stephen Muff, PA-C for our initial evaluation of his chronic pain. He has Anxiety; S/P rotator cuff repair; Chronic pain associated with significant psychosocial dysfunction; Essential (primary) hypertension; Neuropathic pain; Renal colic; Calculus of kidney; Arthritis or polyarthritis, rheumatoid (Stephen Ray); Benign neoplasm of cecum; Recurrent nephrolithiasis; Post-traumatic osteoarthritis of right shoulder; Post-traumatic osteoarthritis, left shoulder; Lumbar spondylosis; History of arthroplasty of left shoulder; History of arthroplasty of right shoulder; Lipoma of right axilla; Carpal tunnel syndrome, left; and Chronic hepatitis B (HCC) on their problem list. Today he comes in for evaluation of his Arm Pain (Right   result of a lymphoma removal, provider accidentally severed a nerve.  )  Pain Assessment: Location: Right Arm Radiating: going from arm pit to fingers in right hand Onset: More than a month ago Duration: Chronic pain Quality: Discomfort,Constant,Nagging,Burning,Tingling,Aching,Numbness (electrifying) Severity: 7 /10 (subjective, self-reported pain score)  Effect on ADL: is not able to work right now because of surgical  injury Timing: Constant Modifying factors: advil and gabapentin but don't help much BP: (!) 169/95 (counseled, reported to provider.  s/s elevated BP reviewed)  HR: 84  Onset and Duration: Sudden and Date of onset: April 2021 Cause of pain: Surgery Severity: Getting worse, NAS-11 at its worse: 10/10, NAS-11 at its best: 6/10, NAS-11 now: 7/10 and NAS-11 on the average: 7/10 Timing: Not influenced by the time of the day Aggravating Factors: Surgery made it worse Alleviating Factors: nothing Associated Problems: Numbness, Tingling, Pain that wakes patient up and Pain that does not allow patient to sleep Quality of Pain: Aching, Agonizing, Annoying, Burning, Deep, Sharp, Shooting, Stabbing and Tingling Previous Examinations or Tests: Nerve conduction test Previous Treatments: Narcotic medications and Physical Therapy  Stephen Ray is a pleasant 55 year old male who presents with a chief complaint of right arm pain inferior to his axilla surgical site.  Of note he has a history of right ulnar fascicle to triceps branch nerve transfer, ulnar nerve internal neurolysis which was done by Dr. Tyler Ray at [facility] on 07/20/2020.  He has pain along his triceps and has weakness with overhead lift.  He states that the pain radiates into his dorsal aspect of his index and middle finger.  He has done physical therapy in the past.  He is on gabapentin 300 mg in the morning 600 mg at night.  Limited benefit although he can notice when he does not take the gabapentin.  Denies having trialed Lyrica in the past.  He is also on Cymbalta 30 mg daily for mental health.  Does receive mild to moderate benefit with oxycodone 5 mg as needed.   Historic Controlled Substance Pharmacotherapy Review  Historical Monitoring: The patient  reports no history of drug use. List of  all UDS Test(s): Lab Results  Component Value Date   MDMA NONE DETECTED 02/29/2016   COCAINSCRNUR NONE DETECTED 02/29/2016   PCPSCRNUR NONE DETECTED  02/29/2016   THCU POSITIVE (A) 02/29/2016   List of other Serum/Urine Drug Screening Test(s):  Lab Results  Component Value Date   COCAINSCRNUR NONE DETECTED 02/29/2016   THCU POSITIVE (A) 02/29/2016   Historical Background Evaluation: [place] PMP: PDMP not reviewed this encounter. Online review of the past 88-monthperiod conducted.              [place] Department of public safety, offender search: (Editor, commissioningInformation) Non-contributory Risk Assessment Profile: Aberrant behavior: None observed or detected today Risk factors for fatal opioid overdose: None identified today Fatal overdose hazard ratio (HR): Calculation deferred Non-fatal overdose hazard ratio (HR): Calculation deferred Risk of opioid abuse or dependence: 0.7-3.0% with doses ? 36 MME/day and 6.1-26% with doses ? 120 MME/day. Substance use disorder (SUD) risk level: Pending results of Medical Psychology Evaluation for SUD Personal History of Substance Abuse (SUD-Substance use disorder):  Alcohol: Negative  Illegal Drugs: Negative  Rx Drugs: Negative  ORT Risk Level calculation: Low Risk  Opioid Risk Tool - 11/16/20 1312      Family History of Substance Abuse   Alcohol Positive Male    Illegal Drugs Negative    Rx Drugs Negative      Personal History of Substance Abuse   Alcohol Negative    Illegal Drugs Negative    Rx Drugs Negative      Psychological Disease   Psychological Disease Negative    Depression Negative      Total Score   Opioid Risk Tool Scoring 3    Opioid Risk Interpretation Low Risk          ORT Scoring interpretation table:  Score <3 = Low Risk for SUD  Score between 4-7 = Moderate Risk for SUD  Score >8 = High Risk for Opioid Abuse   PHQ-2 Depression Scale:  Total score:    PHQ-2 Scoring interpretation table: (Score and probability of major depressive disorder)  Score 0 = No depression  Score 1 = 15.4% Probability  Score 2 = 21.1% Probability  Score 3 = 38.4% Probability  Score 4 = 45.5%  Probability  Score 5 = 56.4% Probability  Score 6 = 78.6% Probability   PHQ-9 Depression Scale:  Total score:    PHQ-9 Scoring interpretation table:  Score 0-4 = No depression  Score 5-9 = Mild depression  Score 10-14 = Moderate depression  Score 15-19 = Moderately severe depression  Score 20-27 = Severe depression (2.4 times higher risk of SUD and 2.89 times higher risk of overuse)   Pharmacologic Plan: As per protocol, I have not taken over any controlled substance management, pending the results of ordered tests and/or consults.            Initial impression: Pending review of available data and ordered tests.  Meds   Current Outpatient Medications:  .  DULoxetine (CYMBALTA) 30 MG capsule, Take 1 capsule by mouth once daily, Disp: 90 capsule, Rfl: 0 .  ibuprofen (ADVIL,MOTRIN) 200 MG tablet, Take 400 mg by mouth every 6 (six) hours as needed for moderate pain. , Disp: , Rfl:  .  lisinopril (ZESTRIL) 40 MG tablet, Take 1 tablet by mouth once daily, Disp: 90 tablet, Rfl: 0 .  pregabalin (LYRICA) 75 MG capsule, 75 mg qday, 150 qhs, Disp: 90 capsule, Rfl: 0 .  oxycodone (OXY-IR) 5 MG capsule,  Take 1 capsule (5 mg total) by mouth every 6 (six) hours as needed. (Patient not taking: Reported on 11/16/2020), Disp: 30 capsule, Rfl: 0  Imaging Review  DG Cervical Spine 1 View  Narrative FINDINGS CLINICAL:   ANTERIOR FUSION CERVICAL SPINE A LATERAL VIEW OF THE CERVICAL SPINE IS COMPARED TO A LATERAL VIEW OF 08/03/99.   THE ANTERIOR FUSION AT C6-7 APPEARS STABLE WITH NO CHANGE IN THE SOMEWHAT STRAIGHTENED ALIGNMENT. IMPRESSION MR SHOULDER RIGHT W WO CONTRAST  Narrative CLINICAL DATA:  Palpable lesion in the right axilla. The patient is status post lipoma removal in 2013.  EXAM: MRI OF THE RIGHT SHOULDER WITHOUT AND WITH CONTRAST  TECHNIQUE: Multiplanar, multisequence MR imaging of the right shoulder was performed before and after the administration of  intravenous contrast.  CONTRAST:  10 cc Gadavist IV.  COMPARISON:  MRI right shoulder 11/12/2014.  FINDINGS: Bones/Joint/Cartilage  Right shoulder arthroplasty results in artifact on the examination. No acute or focal bony abnormality is seen.  Ligaments  Negative.  Muscles and Tendons  Rotator Cuff tendons are largely obscured. No tear or focal lesion is identified.  Soft tissues  There is a fatty tumor in the right axilla anterior to the subscapularis and extending into the medial aspect of the upper arm. The lesion measures 6.5 cm craniocaudal by 6.1 cm transverse by 2.9 cm AP. It is posterior to the axillary vessels and nerve and does not invade them. The lesion has multiple thin septations. There is almost complete signal dropout on fat saturation sequences including postcontrast imaging.  IMPRESSION: Fatty tumor in the axilla has an appearance most compatible with a simple lipoma but given its size and recurrence, atypical lipoma is possible. Excisional biopsy should be considered. No other abnormality is identified.   Electronically Signed By: Inge Rise M.D. On: 04/10/2019 14:46  MR Shoulder Left Wo Contrast  Narrative CLINICAL DATA:  Left shoulder pain, limited range of motion  EXAM: MRI OF THE LEFT SHOULDER WITHOUT CONTRAST  TECHNIQUE: Multiplanar, multisequence MR imaging of the shoulder was performed. No intravenous contrast was administered.  COMPARISON:  None.  FINDINGS: Rotator cuff: Moderate tendinosis of the supraspinatus and infraspinatus tendons. Teres minor tendon is intact. Subscapularis tendon is intact.  Muscles: No atrophy or fatty replacement of nor abnormal signal within, the muscles of the rotator cuff.  Biceps long head:  Intact.  Acromioclavicular Joint: Mild degenerative changes of the acromioclavicular joint. Small amount of subacromial/subdeltoid bursal fluid. Type I acromion.  Glenohumeral Joint: Moderate  joint effusion. Full-thickness cartilage loss of the glenohumeral joint with marginal osteophytosis and subchondral reactive marrow changes. Multiple loose bodies within the joint space and in the long head of the biceps tendon sheath.  Labrum: Increased signal within the superior posterior labrum likely reflecting degeneration.  Bones: Subchondral reactive marrow changes in the humeral head. No acute fracture or dislocation.  IMPRESSION: 1. Severe osteoarthritis of the glenohumeral joint with multiple loose bodies in a moderate joint effusion. Multiple loose bodies extending into the long head of the biceps tendon sheath. 2. Moderate tendinosis of the supraspinatus and infraspinatus tendons.   Electronically Signed By: Kathreen Devoid On: 03/16/2015 15:32   Narrative FINDINGS CLINICAL DATA:  LOWER BACK PAIN AND LEFT LEG PAIN. LUMBOSACRAL SPINE PLAIN FILM EXAM, INCLUDING BILATERAL OBLIQUES AND FLEXION AND EXTENSION VIEWS: FINDINGS:  THERE ARE FIVE NON-RIB BEARING LUMBAR TYPE VERTEBRAE.  NO EVIDENCE OF SLIP OR PARS DEFECT.  MINIMAL L5-S1 DISC SPACE NARROWING.  RIGHT-SIDED RENAL CALCULI. IMPRESSION MINIMAL L5-S1  DISC SPACE NARROWING. RIGHT-SIDED RENAL CALCULI. MR OF THE LUMBAR SPINE: COMPARISON 04/10/00. FINDINGS:  CONUS IS LOCATED AT THE UPPER L1 LEVEL.  HEMANGIOMA T12 VERTEBRA.  L3-4 AND ABOVE UNREMARKABLE. L4-5:  MINIMAL BULGE. L5-S1:  MILD DISC DEGENERATION.  SMALL BROAD-BASED LEFT POSTEROLATERAL DISC PROTRUSION.  THIS HAS NOT CHANGED IN THE INTERIM. IMPRESSION SMALL BROAD-BASED LEFT POSTEROLATERAL L5-S1 DISC PROTRUSION AND MILD DEGENERATIVE DISC DISEASE, UNCHANGED.  DG Lumbar Spine Complete  Narrative CLINICAL DATA:  Low back pain radiating into the right hip for 5 weeks. No known injury.  EXAM: LUMBAR SPINE - COMPLETE 4+ VIEW  COMPARISON:  Abdominal radiographs 02/09/2017. Abdominal CT 03/07/2013.  FINDINGS: Five lumbar type vertebral bodies. The alignment is  normal. There is mild disc space narrowing at L5-S1. The additional disc spaces are preserved. No evidence of acute fracture or pars defect. There are mild facet degenerative changes bilaterally. Two calcifications overlie the left kidney, likely renal calculi based on previous CT.  IMPRESSION: No acute lumbar spine findings. Mild degenerative disc disease at L5-S1 and mild facet hypertrophy. Left renal calculi, similar to previous CT.   Electronically Signed By: Richardean Sale M.D. On: 05/01/2018 17:22   Narrative FINDINGS CLINICAL DATA:  LOWER BACK PAIN AND LEFT LEG PAIN. LUMBOSACRAL SPINE PLAIN FILM EXAM, INCLUDING BILATERAL OBLIQUES AND FLEXION AND EXTENSION VIEWS: FINDINGS:  THERE ARE FIVE NON-RIB BEARING LUMBAR TYPE VERTEBRAE.  NO EVIDENCE OF SLIP OR PARS DEFECT.  MINIMAL L5-S1 DISC SPACE NARROWING.  RIGHT-SIDED RENAL CALCULI. IMPRESSION MINIMAL L5-S1 DISC SPACE NARROWING. RIGHT-SIDED RENAL CALCULI. MR OF THE LUMBAR SPINE: COMPARISON 04/10/00. FINDINGS:  CONUS IS LOCATED AT THE UPPER L1 LEVEL.  HEMANGIOMA T12 VERTEBRA.  L3-4 AND ABOVE UNREMARKABLE. L4-5:  MINIMAL BULGE. L5-S1:  MILD DISC DEGENERATION.  SMALL BROAD-BASED LEFT POSTEROLATERAL DISC PROTRUSION.  THIS HAS NOT CHANGED IN THE INTERIM. IMPRESSION SMALL BROAD-BASED LEFT POSTEROLATERAL L5-S1 DISC PROTRUSION AND MILD DEGENERATIVE DISC DISEASE,   Complexity Note: Imaging results reviewed. Results shared with Mr. Zeidman, using Layman's terms.                         ROS  Cardiovascular: High blood pressure Pulmonary or Respiratory: No reported pulmonary signs or symptoms such as wheezing and difficulty taking a deep full breath (Asthma), difficulty blowing air out (Emphysema), coughing up mucus (Bronchitis), persistent dry cough, or temporary stoppage of breathing during sleep Neurological: No reported neurological signs or symptoms such as seizures, abnormal skin sensations, urinary and/or fecal incontinence,  being born with an abnormal open spine and/or a tethered spinal cord Psychological-Psychiatric: Anxiousness Gastrointestinal: No reported gastrointestinal signs or symptoms such as vomiting or evacuating blood, reflux, heartburn, alternating episodes of diarrhea and constipation, inflamed or scarred liver, or pancreas or irrregular and/or infrequent bowel movements Genitourinary: Passing kidney stones Hematological: No reported hematological signs or symptoms such as prolonged bleeding, low or poor functioning platelets, bruising or bleeding easily, hereditary bleeding problems, low energy levels due to low hemoglobin or being anemic Endocrine: No reported endocrine signs or symptoms such as high or low blood sugar, rapid heart rate due to high thyroid levels, obesity or weight gain due to slow thyroid or thyroid disease Rheumatologic: No reported rheumatological signs and symptoms such as fatigue, joint pain, tenderness, swelling, redness, heat, stiffness, decreased range of motion, with or without associated rash Musculoskeletal: Negative for myasthenia gravis, muscular dystrophy, multiple sclerosis or malignant hyperthermia Work History: Out of work due to pain  Allergies  Mr. Bevins is allergic  to shellfish allergy and shellfish-derived products.  Laboratory Chemistry Profile   Renal Lab Results  Component Value Date   BUN 10 10/06/2020   CREATININE 0.97 10/06/2020   BCR 10 10/06/2020   GFRAA 102 10/06/2020   GFRNONAA 88 10/06/2020   SPECGRAV 1.015 02/06/2017   PHUR 7.5 02/06/2017   PROTEINUR Negative 02/06/2017     Electrolytes Lab Results  Component Value Date   NA 142 10/06/2020   K 3.5 10/06/2020   CL 101 10/06/2020   CALCIUM 9.9 10/06/2020   MG 2.2 02/29/2016     Hepatic Lab Results  Component Value Date   AST 19 10/22/2020   ALT 39 10/22/2020   ALBUMIN 4.5 10/22/2020   ALKPHOS 104 10/22/2020   AMYLASE 40 06/02/2016   LIPASE 43 06/02/2016     ID Lab Results   Component Value Date   SARSCOV2NAA NEGATIVE 01/01/2020   STAPHAUREUS NEGATIVE 08/05/2015   MRSAPCR NEGATIVE 08/05/2015     Bone No results found for: VD25OH, [id number], NK5397QB3, AL9379KW4, 25OHVITD1, 25OHVITD2, 25OHVITD3, TESTOFREE, TESTOSTERONE   Endocrine Lab Results  Component Value Date   GLUCOSE 120 (H) 10/06/2020   GLUCOSEU Negative 02/06/2017   HGBA1C 5.9 (H) 10/21/2020   TSH 1.300 10/06/2020     Neuropathy Lab Results  Component Value Date   VITAMINB12 305 02/29/2016   HGBA1C 5.9 (H) 10/21/2020     CNS No results found for: COLORCSF, APPEARCSF, RBCCOUNTCSF, WBCCSF, POLYSCSF, LYMPHSCSF, EOSCSF, PROTEINCSF, GLUCCSF, JCVIRUS, CSFOLI, IGGCSF, LABACHR, ACETBL, LABACHR, ACETBL   Inflammation (CRP: Acute  ESR: Chronic) No results found for: CRP, ESRSEDRATE, LATICACIDVEN   Rheumatology No results found for: RF, ANA, LABURIC, URICUR, LYMEIGGIGMAB, LYMEABIGMQN, HLAB27   Coagulation Lab Results  Component Value Date   INR 0.89 08/05/2015   LABPROT 12.3 08/05/2015   PLT 298 10/06/2020     Cardiovascular Lab Results  Component Value Date   HGB 14.9 10/06/2020   HCT 42.0 10/06/2020     Screening Lab Results  Component Value Date   SARSCOV2NAA NEGATIVE 01/01/2020   STAPHAUREUS NEGATIVE 08/05/2015   MRSAPCR NEGATIVE 08/05/2015     Cancer No results found for: CEA, CA125, LABCA2   Allergens No results found for: ALMOND, APPLE, ASPARAGUS, AVOCADO, BANANA, BARLEY, BASIL, BAYLEAF, GREENBEAN, LIMABEAN, WHITEBEAN, BEEFIGE, REDBEET, BLUEBERRY, BROCCOLI, CABBAGE, MELON, CARROT, CASEIN, CASHEWNUT, CAULIFLOWER, CELERY     Note: Lab results reviewed.  [place]  Drug: Mr. Hannen  reports no history of drug use. Alcohol:  reports current alcohol use of about 4.0 standard drinks of alcohol per week. Tobacco:  reports that he has quit smoking. He has never used smokeless tobacco. Medical:  has a past medical history of Anxiety, Arthritis, Asthma, Hypertension, Kidney  stones, Motion sickness, and Shoulder pain. Family: family history includes Cancer in his father; Nephrolithiasis in his father.  Past Surgical History:  Procedure Laterality Date  . CERVICAL FUSION  2001  . COLONOSCOPY WITH PROPOFOL N/A 03/31/2016   Procedure: COLONOSCOPY WITH PROPOFOL;  Surgeon: Lucilla Lame, MD;  Location: [place];  Service: Endoscopy;  Laterality: N/A;  . HERNIA REPAIR  2005  . LIPOMA EXCISION Right 2012   under arm  . LIPOMA EXCISION Right 01/03/2020   Procedure: EXCISION LIPOMA;  Surgeon: Robert Bellow, MD;  Location: [facility];  Service: General;  Laterality: Right;  . LITHOTRIPSY  2002  . POLYPECTOMY  03/31/2016   Procedure: POLYPECTOMY;  Surgeon: Lucilla Lame, MD;  Location: [place];  Service: Endoscopy;;  . SHOULDER HEMI-ARTHROPLASTY Left  08/20/2015   Procedure: SHOULDER HEMI-ARTHROPLASTY;  Surgeon: Corky Mull, MD;  Location: [facility];  Service: Orthopedics;  Laterality: Left;  . SHOULDER SURGERY Right 12/25/2014  . TOTAL SHOULDER ARTHROPLASTY Right    Active Ambulatory Problems    Diagnosis Date Noted  . Anxiety 03/30/2015  . S/P rotator cuff repair 03/30/2015  . Chronic pain associated with significant psychosocial dysfunction 01/17/2013  . Essential (primary) hypertension 03/30/2015  . Neuropathic pain 08/29/2013  . Renal colic 94/50/3888  . Calculus of kidney 03/30/2015  . Arthritis or polyarthritis, rheumatoid ([place]) 03/30/2015  . Benign neoplasm of cecum   . Recurrent nephrolithiasis 01/17/2013  . Post-traumatic osteoarthritis of right shoulder 03/20/2014  . Post-traumatic osteoarthritis, left shoulder 03/18/2015  . Lumbar spondylosis 05/11/2018  . History of arthroplasty of left shoulder 08/20/2015  . History of arthroplasty of right shoulder 12/25/2014  . Lipoma of right axilla 03/27/2019  . Carpal tunnel syndrome, left 10/15/2018  . Chronic hepatitis B ([place]) 11/03/2020   Resolved Ambulatory Problems    Diagnosis Date  Noted  . Biceps tendinitis 01/28/2013  . Bursitis of shoulder 01/28/2013  . 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 Status post left shoulder hemiarthroplasty 08/20/2015  . Special screening for malignant neoplasms, colon   . Hypokalemia 08/07/2015  . Olecranon bursitis of right elbow 03/18/2015  . Speech disturbance 01/17/2013  . Lipoma 04/11/2012  . Fatigue 10/10/2016  . Lumbar strain 05/11/2018   Past Medical History:  Diagnosis Date  . Arthritis   . Asthma   . Hypertension   . Kidney stones   . Motion sickness   . Shoulder pain    Constitutional Exam  General appearance: Well nourished, well developed, and well hydrated. In no apparent acute distress Vitals:   11/16/20 1305 11/16/20 1342  BP: (!) 170/113 (!) 169/95  Pulse: 84   Resp: 16   Temp: 98 F (36.7 C)   TempSrc: Temporal   SpO2: 98%   Weight: 222 lb (100.7 kg)   Height: 5' 10" (1.778 m)    BMI Assessment: Estimated body mass index is 31.85 kg/m as calculated from the following:   Height as of this encounter: 5' 10" (1.778 m).   Weight as of this encounter: 222 lb (100.7 kg).  BMI interpretation table: BMI level Category Range association with higher incidence of chronic pain  <18 kg/m2 Underweight   18.5-24.9 kg/m2 Ideal body weight   25-29.9 kg/m2 Overweight Increased incidence by 20%  30-34.9 kg/m2 Obese (Class I) Increased incidence by 68%  35-39.9 kg/m2 Severe obesity (Class II) Increased incidence by 136%  >40 kg/m2 Extreme obesity (Class III) Increased incidence by 254%   Patient's current BMI Ideal Body weight  Body mass index is 31.85 kg/m. Ideal body weight: 73 kg (160 lb 15 oz) Adjusted ideal body weight: 84.1 kg (185 lb 5.8 oz)   BMI Readings from Last 4 Encounters:  11/16/20 31.85 kg/m  02/25/20 31.85 kg/m  12/24/19 30.13 kg/m  08/06/19 31.57 kg/m   Wt Readings from Last 4 Encounters:  11/16/20 222 lb (100.7 kg)  02/25/20 222 lb (100.7 kg)  12/24/19 210 lb (95.3 kg)   08/06/19 220 lb (99.8 kg)    Psych/Mental status: Alert, oriented x 3 (person, place, & time)       Eyes: PERLA Respiratory: No evidence of acute respiratory distress  Cervical Spine Exam  Skin & Axial Inspection: Well healed scar from previous spine surgery detected Alignment: Symmetrical Functional ROM: Unrestricted ROM  Stability: No instability detected Muscle Tone/Strength: Functionally intact. No obvious neuro-muscular anomalies detected. Sensory (Neurological): Unimpaired Palpation: No palpable anomalies              Upper Extremity (UE) Exam    Side: Right upper extremity  Side: Left upper extremity  Skin & Extremity Inspection: Below elbow amputation (BEA)  Skin & Extremity Inspection: Evidence of prior arthroplastic surgery  Functional ROM: Pain restricted ROM for all joints of upper extremity  Functional ROM: Unrestricted ROM          Muscle Tone/Strength: Functionally intact. No obvious neuro-muscular anomalies detected.   Muscle Tone/Strength: Functionally intact. No obvious neuro-muscular anomalies detected.  Sensory (Neurological): Neuropathic pain pattern          Sensory (Neurological): Unimpaired          Palpation: No palpable anomalies              Palpation: No palpable anomalies              Provocative Test(s):  Phalen's test: deferred Tinel's test: deferred Apley's scratch test (touch opposite shoulder):  Action 1 (Across chest): Decreased ROM Action 2 (Overhead): Decreased ROM Action 3 (LB reach): Decreased ROM   Provocative Test(s):  Phalen's test: deferred Tinel's test: deferred Apley's scratch test (touch opposite shoulder):  Action 1 (Across chest): deferred Action 2 (Overhead): deferred Action 3 (LB reach): deferred    Thoracic Spine Area Exam  Skin & Axial Inspection: No masses, redness, or swelling Alignment: Symmetrical Functional ROM: Unrestricted ROM Stability: No instability detected Muscle Tone/Strength: Functionally intact. No  obvious neuro-muscular anomalies detected.  Lumbar Exam  Skin & Axial Inspection: No masses, redness, or swelling Alignment: Symmetrical Functional ROM: Unrestricted ROM       Stability: No instability detected Muscle Tone/Strength: Functionally intact. No obvious neuro-muscular anomalies detected. Sensory (Neurological): Unimpaired  Gait & Posture Assessment  Ambulation: Unassisted Gait: Relatively normal for age and body habitus Posture: WNL   Lower Extremity Exam    Side: Right lower extremity  Side: Left lower extremity  Stability: No instability observed          Stability: No instability observed          Skin & Extremity Inspection: Skin color, temperature, and hair growth are WNL. No peripheral edema or cyanosis. No masses, redness, swelling, asymmetry, or associated skin lesions. No contractures.  Skin & Extremity Inspection: Skin color, temperature, and hair growth are WNL. No peripheral edema or cyanosis. No masses, redness, swelling, asymmetry, or associated skin lesions. No contractures.  Functional ROM: Unrestricted ROM                  Functional ROM: Unrestricted ROM                  Muscle Tone/Strength: Functionally intact. No obvious neuro-muscular anomalies detected.  Muscle Tone/Strength: Functionally intact. No obvious neuro-muscular anomalies detected.  Sensory (Neurological): Unimpaired        Sensory (Neurological): Unimpaired        DTR: Patellar: deferred today Achilles: deferred today Plantar: deferred today  DTR: Patellar: deferred today Achilles: deferred today Plantar: deferred today  Palpation: No palpable anomalies  Palpation: No palpable anomalies   Assessment  Primary Diagnosis & Pertinent Problem List: The primary encounter diagnosis was Right radial sensory nerve injury, sequela. Diagnoses of Neuropathic pain of forearm, right and Chronic pain syndrome were also pertinent to this visit.  Visit Diagnosis (New problems to examiner): 1. Right  radial  sensory nerve injury, sequela   2. Neuropathic pain of forearm, right   3. Chronic pain syndrome    Plan of Care (Initial workup plan)  General Recommendations: The pain condition that the patient suffers from is best treated with a multidisciplinary approach that involves an increase in physical activity to prevent de-conditioning and worsening of the pain cycle, as well as psychological counseling (formal and/or informal) to address the co-morbid psychological affects of pain. Treatment will often involve judicious use of pain medications and interventional procedures to decrease the pain, allowing the patient to participate in the physical activity that will ultimately produce long-lasting pain reductions. The goal of the multidisciplinary approach is to return the patient to a higher level of overall function and to restore their ability to perform activities of daily living.  Note: Mr. Lalone was reminded that as per protocol, today's visit has been an evaluation only. We have not taken over the patient's controlled substance management.  1.  Urine toxicology screen, referral to Dr Shea Evans for risk assessment prior to considering chronic opioid therapy 2.  Stop gabapentin.  Trial of Lyrica as below.  Continue Cymbalta as prescribed 3.  Consider right brachial plexus nerve block in future, stellate ganglion nerve block   Lab Orders     Compliance Drug Analysis, Ur  Referral Orders     Ambulatory referral to Psychology Pharmacotherapy (current): Medications ordered:  Meds ordered this encounter  Medications  . pregabalin (LYRICA) 75 MG capsule    Sig: 75 mg qday, 150 qhs    Dispense:  90 capsule    Refill:  0    Fill one day early if pharmacy is closed on scheduled refill date. May substitute for generic if available.   Medications administered during this visit: Jadarius R. Mullens had no medications administered during this visit.   Pharmacological management options:  Opioid  Analgesics: The patient was informed that there is no guarantee that he would be a candidate for opioid analgesics. The decision will be made following CDC guidelines. This decision will be based on the results of diagnostic studies, as well as Mr. Warda's risk profile.   Membrane stabilizer: Currently on gabapentin and Cymbalta.  Continue Cymbalta, trial Lyrica.  Stop gabapentin.  Muscle relaxant: To be determined at a later time  NSAID: Ibuprofen  Other analgesic(s): To be determined at a later time   Interventional management options: Mr. Brideau was informed that there is no guarantee that he would be a candidate for interventional therapies. The decision will be based on the results of diagnostic studies, as well as Mr. Pavlich's risk profile.  Procedure(s) under consideration:  Right brachial plexus nerve block Right stellate ganglion nerve block   Provider-requested follow-up: Return for pt will call after Psych assessment to sch 2nd visit.  Future Appointments  Date Time Provider [place]  11/17/2020 10:15 AM Tsosie Billing, MD [facility] None  01/07/2021  8:40 AM Stephen Ray, Stephen Muff, PA-C [facility]    Note by: Stephen Santa, MD Date: 11/16/2020; Time: 2:08 PM

## 2020-11-17 ENCOUNTER — Encounter: Payer: Self-pay | Admitting: Infectious Diseases

## 2020-11-17 ENCOUNTER — Other Ambulatory Visit
Admission: RE | Admit: 2020-11-17 | Discharge: 2020-11-17 | Disposition: A | Discharge status: Home / Self Care | Payer: BC Managed Care – PPO | Source: Ambulatory Visit | Attending: Infectious Diseases | Admitting: Infectious Diseases

## 2020-11-17 ENCOUNTER — Ambulatory Visit: Payer: BC Managed Care – PPO | Attending: Infectious Diseases | Admitting: Infectious Diseases

## 2020-11-17 VITALS — BP 151/100 | HR 80 | Resp 16 | Ht 70.0 in | Wt 222.0 lb

## 2020-11-17 DIAGNOSIS — Z79899 Other long term (current) drug therapy: Secondary | ICD-10-CM | POA: Insufficient documentation

## 2020-11-17 DIAGNOSIS — Z96612 Presence of left artificial shoulder joint: Secondary | ICD-10-CM | POA: Diagnosis not present

## 2020-11-17 DIAGNOSIS — R768 Other specified abnormal immunological findings in serum: Secondary | ICD-10-CM | POA: Insufficient documentation

## 2020-11-17 DIAGNOSIS — S5421XS Injury of radial nerve at forearm level, right arm, sequela: Secondary | ICD-10-CM | POA: Insufficient documentation

## 2020-11-17 DIAGNOSIS — Y838 Other surgical procedures as the cause of abnormal reaction of the patient, or of later complication, without mention of misadventure at the time of the procedure: Secondary | ICD-10-CM | POA: Diagnosis not present

## 2020-11-17 DIAGNOSIS — I10 Essential (primary) hypertension: Secondary | ICD-10-CM | POA: Diagnosis not present

## 2020-11-17 DIAGNOSIS — Z981 Arthrodesis status: Secondary | ICD-10-CM | POA: Diagnosis not present

## 2020-11-17 DIAGNOSIS — Z96611 Presence of right artificial shoulder joint: Secondary | ICD-10-CM | POA: Diagnosis not present

## 2020-11-17 DIAGNOSIS — Z87891 Personal history of nicotine dependence: Secondary | ICD-10-CM | POA: Insufficient documentation

## 2020-11-17 LAB — HEPATITIS A ANTIBODY, TOTAL: hep A Total Ab: NONREACTIVE

## 2020-11-17 LAB — HEPATITIS B SURFACE ANTIGEN: Hepatitis B Surface Ag: NONREACTIVE

## 2020-11-17 LAB — HIV ANTIBODY (ROUTINE TESTING W REFLEX): HIV Screen 4th Generation wRfx: NONREACTIVE

## 2020-11-17 NOTE — Patient Instructions (Signed)
You are here for a positive hepatitis B core antibody- you dont have a positive hepatitis B surface antigen- No Blood transfusion, No IVDA. This could be a false positive- will check HEPB DNA , surface antibody and HEP A antibody

## 2020-11-17 NOTE — Progress Notes (Signed)
NAME: Stephen Ray  DOB: Mar 28, 1966  MRN: [id number]  Date/Time: 11/17/2020 10:32 AM  Subjective:    ? 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 Pt denies any IVDA, no blood transfusion. No h/o hepatitis in the past. There is a possibility of blood contamination with his Brother's wounds . His brother has some form of hepatitis related to IVDA    Past Medical History:  Diagnosis Date  . Anxiety   . Arthritis    shoulders (before replacements)  . Asthma    as child  . Hypertension   . Kidney stones   . Motion sickness    deep sea fishing  . Shoulder pain    left and right    Past Surgical History:  Procedure Laterality Date  . CERVICAL FUSION  2001  . COLONOSCOPY WITH PROPOFOL N/A 03/31/2016   Procedure: COLONOSCOPY WITH PROPOFOL;  Surgeon: Lucilla Lame, MD;  Location: [place];  Service: Endoscopy;  Laterality: N/A;  . HERNIA REPAIR  2005  . LIPOMA EXCISION Right 2012   under arm  . LIPOMA EXCISION Right 01/03/2020   Procedure: EXCISION LIPOMA;  Surgeon: Robert Bellow, MD;  Location: [facility];  Service: General;  Laterality: Right;  . LITHOTRIPSY  2002  . POLYPECTOMY  03/31/2016   Procedure: POLYPECTOMY;  Surgeon: Lucilla Lame, MD;  Location: [place];  Service: Endoscopy;;  . SHOULDER HEMI-ARTHROPLASTY Left 08/20/2015   Procedure: SHOULDER HEMI-ARTHROPLASTY;  Surgeon: Corky Mull, MD;  Location: [facility];  Service: Orthopedics;  Laterality: Left;  . SHOULDER SURGERY Right 12/25/2014  . TOTAL SHOULDER ARTHROPLASTY Right     Social History   Socioeconomic History  . Marital status: Married    Spouse name: Not on file  . Number of children: Not on file  . Years of education: Not on file  . Highest  education level: Not on file  Occupational History  . Occupation: Museum/gallery curator / Architect (Self employed)  Tobacco Use  . Smoking status: Former Research scientist (life sciences)  . Smokeless tobacco: Never Used  . Tobacco comment: quit 18 yrs ago  Vaping Use  . Vaping Use: Never used  Substance and Sexual Activity  . Alcohol use: Yes    Alcohol/week: 4.0 standard drinks    Types: 4 Shots of liquor per week    Comment: Occasionally   . Drug use: No  . Sexual activity: Not on file  Other Topics Concern  . Not on file  Social History Narrative  . Not on file   Social Determinants of Health   Financial Resource Strain: Not on file  Food Insecurity: Not on file  Transportation Needs: Not on file  Physical Activity: Not on file  Stress: Not on file  Social Connections: Not on file  Intimate Partner Violence: Not on file    Family History  Problem Relation Age of Onset  . Nephrolithiasis Father   . Cancer Father        Leukemia  . Kidney cancer Neg Hx   . Prostate cancer Neg Hx    Allergies  Allergen Reactions  . Shellfish Allergy Anaphylaxis  . Shellfish-Derived Products Anaphylaxis   I? Current Outpatient Medications  Medication Sig Dispense Refill  . DULoxetine (CYMBALTA) 30 MG capsule Take 1  capsule by mouth once daily 90 capsule 0  . ibuprofen (ADVIL,MOTRIN) 200 MG tablet Take 400 mg by mouth every 6 (six) hours as needed for moderate pain.     Marland Kitchen lisinopril (ZESTRIL) 40 MG tablet Take 1 tablet by mouth once daily 90 tablet 0  . pregabalin (LYRICA) 75 MG capsule 75 mg qday, 150 qhs 90 capsule 0  . oxycodone (OXY-IR) 5 MG capsule Take 1 capsule (5 mg total) by mouth every 6 (six) hours as needed. (Patient not taking: No sig reported) 30 capsule 0   No current facility-administered medications for this visit.     Abtx:  Anti-infectives (From admission, onward)   None      REVIEW OF SYSTEMS:  Const: negative fever, negative chills, negative weight loss Eyes: negative diplopia or visual  changes, negative eye pain ENT: negative coryza, negative sore throat Resp: negative cough, hemoptysis, dyspnea Cards: negative for chest pain, palpitations, lower extremity edema GU: negative for frequency, dysuria and hematuria GI: Negative for abdominal pain, diarrhea, bleeding, constipation Skin: negative for rash and pruritus Heme: negative for easy bruising and gum/nose bleeding MS: negative for myalgias, arthralgias, back pain and muscle weakness Neurolo: weakness rt arm with some numbness and sharp pain following nerve injury after lipoma 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  Head: Normocephalic, without obvious abnormality, atraumatic. Eyes: Conjunctivae clear, anicteric sclerae. Pupils are equal ENT Nares normal. No drainage or sinus tenderness. Lips, mucosa, and tongue normal. No Thrush Neck: Supple, symmetrical, no adenopathy, thyroid: non tender no carotid bruit and no JVD. Back: No CVA tenderness. Lungs: Clear to auscultation bilaterally. No Wheezing or Rhonchi. No rales. Heart: Regular rate and rhythm, no murmur, rub or gallop. Abdomen: Soft, non-tender,not distended. Bowel sounds normal. No masses Extremities: surgical scars both shoulder. Rt inner arm, no cyanosis. No edema. No clubbing Skin: No rashes or lesions. Or bruising Lymph: Cervical, supraclavicular normal. Neurologic:numness over rt elbow Muscle wasting triceps Rt arm Pertinent Labs Lab Results CBC    Component Value Date/Time   WBC 7.1 10/06/2020 1035   WBC 7.4 04/24/2019 1547   RBC 4.64 10/06/2020 1035   RBC 4.38 04/24/2019 1547   HGB 14.9 10/06/2020 1035    HCT 42.0 10/06/2020 1035   PLT 298 10/06/2020 1035   MCV 91 10/06/2020 1035   MCV 96 12/26/2014 0432   MCH 32.1 10/06/2020 1035   MCH 32.2 04/24/2019 1547   MCHC 35.5 10/06/2020 1035   MCHC 34.6 04/24/2019 1547   RDW 12.2 10/06/2020 1035   RDW 13.1 12/26/2014 0432   LYMPHSABS 2.4 10/06/2020 1035   LYMPHSABS 1.6 12/26/2014 0432   MONOABS 0.6 04/24/2019 1547   MONOABS 0.9 12/26/2014 0432   EOSABS 0.2 10/06/2020 1035   EOSABS 0.0 12/26/2014 0432   BASOSABS 0.1 10/06/2020 1035   BASOSABS 0.0 12/26/2014 0432    CMP Latest Ref Rng & Units 10/22/2020 10/06/2020 04/24/2019  Glucose 65 - 99 mg/dL - 120(H) 103(H)  BUN 6 - 24 mg/dL - 10 18  Creatinine 0.76 - 1.27 mg/dL - 0.97 0.85  Sodium 134 - 144 mmol/L - 142 140  Potassium 3.5 - 5.2 mmol/L - 3.5 3.5  Chloride 96 - 106 mmol/L - 101 102  CO2 20 - 29 mmol/L - 24 28  Calcium 8.7 - 10.2 mg/dL - 9.9 9.7  Total Protein 6.0 - 8.5 g/dL 6.9 7.4 -  Total Bilirubin 0.0 - 1.2 mg/dL 0.4 0.4 -  Alkaline Phos 44 - 121 IU/L 104 112 -  AST 0 - 40 IU/L 19 31 -  ALT 0 - 44 IU/L 39 62(H) -   HEPB core antibody pos surgface antigen neg HePC neg   ? Impression/Recommendation ? Hepatitis  B core antibody positive with neg IgM, neg surface antigen and normal LFTS. This explains 3 scenarios A)recovering acute infection- which is not the case as LFTS normal b) f he was chronically infected then we should have Hepatitis DNA positive- so will send one today C)or a false positive test  We can differentiate between the three by repeating the test, sending HEPB DNA, e antigen/antibody Will also check HIV/HEP A total  Discussed the significance of the test result and other tests that are ordered today We cannot say he has chronic Hepatitis B infection at this time ?  B/l shoulder arthroplasties  Rt axialla lipoma removal with injury to the radial nerve leading to triceps weakness , underwent nerve transfer surgery at [facility] and function has improved-  still has pain and numbness rt arm  HTn on lisinopril ___________________________________________________ Discussed with patient, requesting provider Note:  This document was prepared using Dragon voice recognition software and may include unintentional dictation errors.

## 2020-11-18 LAB — HEPATITIS B DNA, ULTRAQUANTITATIVE, PCR
HBV DNA SERPL PCR-ACNC: NOT DETECTED IU/mL
HBV DNA SERPL PCR-LOG IU: UNDETERMINED log10 IU/mL

## 2020-11-18 LAB — HEPATITIS B E ANTIGEN: Hep B E Ag: NEGATIVE

## 2020-11-18 LAB — HEPATITIS B CORE ANTIBODY, TOTAL: Hep B Core Total Ab: REACTIVE — AB

## 2020-11-18 LAB — HEPATITIS B SURFACE ANTIBODY, QUANTITATIVE: Hep B S AB Quant (Post): 51.7 m[IU]/mL (ref >9.9)

## 2020-11-24 LAB — COMPLIANCE DRUG ANALYSIS, UR

## 2020-11-26 ENCOUNTER — Ambulatory Visit
Admission: RE | Admit: 2020-11-26 | Discharge: 2020-11-26 | Disposition: A | Discharge status: Home / Self Care | Payer: BC Managed Care – PPO | Source: Ambulatory Visit | Attending: Surgery | Admitting: Surgery

## 2020-11-26 ENCOUNTER — Other Ambulatory Visit: Payer: Self-pay

## 2020-11-26 DIAGNOSIS — S83232A Complex tear of medial meniscus, current injury, left knee, initial encounter: Secondary | ICD-10-CM | POA: Insufficient documentation

## 2020-12-04 ENCOUNTER — Telehealth: Payer: Self-pay | Admitting: Family Medicine

## 2020-12-04 NOTE — Telephone Encounter (Signed)
Please review

## 2020-12-04 NOTE — Telephone Encounter (Signed)
Pt is calling office manger regarding bill with DOS 10/06/20 Essential (primary) hypertension +2 more Patient states that his insurance needs to be billed. Pt has BCBS state plan.  Please advise Cb- [phone]

## 2020-12-07 ENCOUNTER — Other Ambulatory Visit: Payer: Self-pay | Admitting: Surgery

## 2020-12-07 NOTE — Telephone Encounter (Signed)
Stephen Ray - Can you go back in and attach his insurance to that DOS.  Thanks

## 2020-12-16 ENCOUNTER — Other Ambulatory Visit
Admission: RE | Admit: 2020-12-16 | Discharge: 2020-12-16 | Disposition: A | Discharge status: Home / Self Care | Payer: BC Managed Care – PPO | Source: Ambulatory Visit | Attending: Surgery | Admitting: Surgery

## 2020-12-16 ENCOUNTER — Encounter
Admission: RE | Admit: 2020-12-16 | Discharge: 2020-12-16 | Disposition: A | Discharge status: Home / Self Care | Payer: BC Managed Care – PPO | Source: Ambulatory Visit | Attending: Surgery | Admitting: Surgery

## 2020-12-16 ENCOUNTER — Other Ambulatory Visit: Payer: Self-pay

## 2020-12-16 DIAGNOSIS — I10 Essential (primary) hypertension: Secondary | ICD-10-CM | POA: Diagnosis not present

## 2020-12-16 DIAGNOSIS — Z01818 Encounter for other preprocedural examination: Secondary | ICD-10-CM | POA: Diagnosis not present

## 2020-12-16 HISTORY — DX: Pure hypercholesterolemia, unspecified: E78.00

## 2020-12-16 HISTORY — DX: Personal history of urinary calculi: Z87.442

## 2020-12-16 HISTORY — DX: Polyneuropathy, unspecified: G62.9

## 2020-12-16 NOTE — Patient Instructions (Addendum)
Your procedure is scheduled on:  Thursday, April 7 Report to the Registration Desk on the 1st floor of the [facility]. To find out your arrival time, please call [phone] between 1PM - 3PM on: Wednesday, April 6  REMEMBER: Instructions that are not followed completely may result in serious medical risk, up to and including death; or upon the discretion of your surgeon and anesthesiologist your surgery may need to be rescheduled.  Do not eat food after midnight the night before surgery.  No gum chewing, lozengers or hard candies.  You may however, drink CLEAR liquids up to 2 hours before you are scheduled to arrive for your surgery. Do not drink anything within 2 hours of your scheduled arrival time.  Clear liquids include: - water  - apple juice without pulp - gatorade (not RED, PURPLE, OR BLUE) - black coffee or tea (Do NOT add milk or creamers to the coffee or tea) Do NOT drink anything that is not on this list.  In addition, your doctor has ordered for you to drink the provided  Ensure Pre-Surgery Clear Carbohydrate Drink  Drinking this carbohydrate drink up to two hours before surgery helps to reduce insulin resistance and improve patient outcomes. Please complete drinking 2 hours prior to scheduled arrival time.  TAKE THESE MEDICATIONS THE MORNING OF SURGERY WITH A SIP OF WATER:  1.  Amlodipine 2.  Duloxetine 3.  Gabapentin  One week prior to surgery: starting March 31 Stop Anti-inflammatories (NSAIDS) such as Advil, Aleve, Ibuprofen, Motrin, Naproxen, Naprosyn and Aspirin based products such as Excedrin, Goodys Powder, BC Powder. Stop ANY OVER THE COUNTER supplements until after surgery. May take Tylenol if needed for pain.  No Alcohol for 24 hours before or after surgery.  No Smoking including e-cigarettes for 24 hours prior to surgery.  No chewable tobacco products for at least 6 hours prior to surgery.  No nicotine patches on the day of surgery.  Do not use  any "recreational" drugs for at least a week prior to your surgery.  Please be advised that the combination of cocaine and anesthesia may have negative outcomes, up to and including death. If you test positive for cocaine, your surgery will be cancelled.  On the morning of surgery brush your teeth with toothpaste and water, you may rinse your mouth with mouthwash if you wish. Do not swallow any toothpaste or mouthwash.  Do not wear jewelry, make-up, hairpins, clips or nail polish.  Do not wear lotions, powders, or perfumes.   Do not shave body from the neck down 48 hours prior to surgery just in case you cut yourself which could leave a site for infection.  Also, freshly shaved skin may become irritated if using the CHG soap.  Do not bring valuables to the hospital. [facility] is not responsible for any missing/lost belongings or valuables.   Use CHG Soap as directed on instruction sheet.  Notify your doctor if there is any change in your medical condition (cold, fever, infection).  Wear comfortable clothing (specific to your surgery type) to the hospital.  Plan for stool softeners for home use; pain medications have a tendency to cause constipation. You can also help prevent constipation by eating foods high in fiber such as fruits and vegetables and drinking plenty of fluids as your diet allows.  After surgery, you can help prevent lung complications by doing breathing exercises.  Take deep breaths and cough every 1-2 hours. Your doctor may order a device called an  Incentive Spirometer to help you take deep breaths.  If you are being discharged the day of surgery, you will not be allowed to drive home. You will need a responsible adult (18 years or older) to drive you home and stay with you that night.   If you are taking public transportation, you will need to have a responsible adult (18 years or older) with you. Please confirm with your physician that it is acceptable to use  public transportation.   Please call the [place] [facility] at [phone] if you have any questions about these instructions.  Surgery Visitation Policy:  Patients undergoing a surgery or procedure may have one family member or support person with them as long as that person is not COVID-19 positive or experiencing its symptoms.  That person may remain in the waiting area during the procedure.

## 2020-12-21 ENCOUNTER — Other Ambulatory Visit: Payer: Self-pay | Admitting: Family Medicine

## 2020-12-21 DIAGNOSIS — F419 Anxiety disorder, unspecified: Secondary | ICD-10-CM

## 2020-12-21 NOTE — Telephone Encounter (Signed)
Notes to clinic: medication filled by a historical provider Review for refill    Requested Prescriptions  Pending Prescriptions Disp Refills   amLODipine (NORVASC) 10 MG tablet [Pharmacy Med Name: amLODIPine Besylate 10 MG Oral Tablet] 90 tablet 0    Sig: Take 1 tablet by mouth once daily      Cardiovascular:  Calcium Channel Blockers Failed - 12/21/2020 11:31 AM      Failed - Last BP in normal range    BP Readings from Last 1 Encounters:  11/17/20 (!) 151/100          Passed - Valid encounter within last 6 months    Recent Outpatient Visits           2 months ago Essential (primary) hypertension   [place], PA-C   4 months ago Patient left without being seen   [facility], Anderson Malta M, PA-C   10 months ago Essential (primary) hypertension   [facility], Vickki Muff, PA-C   1 year ago Left lateral epicondylitis   [place], PA-C   1 year ago Generalized body aches   [facility], Vickki Muff, PA-C       Future Appointments             In 2 weeks [place]ism[place], Vickki Muff, PA-C [facility], [facility]              Signed Prescriptions Disp Refills   lisinopril (ZESTRIL) 40 MG tablet 90 tablet 0    Sig: Take 1 tablet by mouth once daily      Cardiovascular:  ACE Inhibitors Failed - 12/21/2020 11:31 AM      Failed - Last BP in normal range    BP Readings from Last 1 Encounters:  11/17/20 (!) 151/100          Passed - Cr in normal range and within 180 days    Creat  Date Value Ref Range Status  08/09/2017 0.92 0.70 - 1.33 mg/dL Final    Comment:    For patients >63 years of age, the reference limit for Creatinine is approximately 13% higher for people identified as African-American. .    Creatinine, Ser  Date Value Ref Range Status  10/06/2020 0.97 0.76 - 1.27 mg/dL Final          Passed - K in normal range and  within 180 days    Potassium  Date Value Ref Range Status  10/06/2020 3.5 3.5 - 5.2 mmol/L Final  12/26/2014 3.4 (L) mmol/L Final    Comment:    3.5-5.1 NOTE: New Reference Range  11/25/14           Passed - Patient is not pregnant      Passed - Valid encounter within last 6 months    Recent Outpatient Visits           2 months ago Essential (primary) hypertension   [place], PA-C   4 months ago Patient left without being seen   [facility], Anderson Malta M, PA-C   10 months ago Essential (primary) hypertension   [facility], Vickki Muff, PA-C   1 year ago Left lateral epicondylitis   [place], PA-C   1 year ago Generalized body aches   [place], [place]       Future Appointments  In 2 weeks Chrismon, Vickki Muff, PA-C [facility], [facility]               DULoxetine (CYMBALTA) 30 MG capsule 90 capsule 0    Sig: Take 1 capsule by mouth once daily      Psychiatry: Antidepressants - SNRI Failed - 12/21/2020 11:31 AM      Failed - Last BP in normal range    BP Readings from Last 1 Encounters:  11/17/20 (!) 151/100          Passed - Valid encounter within last 6 months    Recent Outpatient Visits           2 months ago Essential (primary) hypertension   [place], PA-C   4 months ago Patient left without being seen   [facility], Clearnce Sorrel, PA-C   10 months ago Essential (primary) hypertension   [facility], Vickki Muff, PA-C   1 year ago Left lateral epicondylitis   [place], PA-C   1 year ago Generalized body aches   [facility], Vickki Muff, PA-C       Future Appointments             In 2 weeks Chrismon, Vickki Muff, PA-C [facility],  [place]

## 2020-12-22 ENCOUNTER — Other Ambulatory Visit
Admission: RE | Admit: 2020-12-22 | Discharge: 2020-12-22 | Disposition: A | Discharge status: Home / Self Care | Payer: BC Managed Care – PPO | Source: Ambulatory Visit | Attending: Surgery | Admitting: Surgery

## 2020-12-22 ENCOUNTER — Other Ambulatory Visit: Payer: Self-pay

## 2020-12-22 DIAGNOSIS — S83232A Complex tear of medial meniscus, current injury, left knee, initial encounter: Secondary | ICD-10-CM | POA: Diagnosis not present

## 2020-12-22 DIAGNOSIS — Z91013 Allergy to seafood: Secondary | ICD-10-CM | POA: Diagnosis not present

## 2020-12-22 DIAGNOSIS — M6752 Plica syndrome, left knee: Secondary | ICD-10-CM | POA: Diagnosis not present

## 2020-12-22 DIAGNOSIS — Z20822 Contact with and (suspected) exposure to covid-19: Secondary | ICD-10-CM | POA: Diagnosis not present

## 2020-12-22 DIAGNOSIS — I10 Essential (primary) hypertension: Secondary | ICD-10-CM | POA: Diagnosis not present

## 2020-12-22 DIAGNOSIS — Z01812 Encounter for preprocedural laboratory examination: Secondary | ICD-10-CM | POA: Insufficient documentation

## 2020-12-22 DIAGNOSIS — M1712 Unilateral primary osteoarthritis, left knee: Secondary | ICD-10-CM | POA: Diagnosis not present

## 2020-12-22 DIAGNOSIS — Z79899 Other long term (current) drug therapy: Secondary | ICD-10-CM | POA: Diagnosis not present

## 2020-12-22 DIAGNOSIS — X58XXXA Exposure to other specified factors, initial encounter: Secondary | ICD-10-CM | POA: Diagnosis not present

## 2020-12-22 LAB — SARS CORONAVIRUS 2 (TAT 6-24 HRS): SARS Coronavirus 2: NEGATIVE

## 2020-12-24 ENCOUNTER — Ambulatory Visit: Payer: BC Managed Care – PPO | Admitting: Anesthesiology

## 2020-12-24 ENCOUNTER — Other Ambulatory Visit: Payer: Self-pay

## 2020-12-24 ENCOUNTER — Encounter
Admission: RE | Disposition: A | Discharge status: Home / Self Care | Payer: Self-pay | Source: Home / Self Care | Attending: Surgery

## 2020-12-24 ENCOUNTER — Encounter: Payer: Self-pay | Admitting: Surgery

## 2020-12-24 ENCOUNTER — Ambulatory Visit
Admission: RE | Admit: 2020-12-24 | Discharge: 2020-12-24 | Disposition: A | Discharge status: Home / Self Care | Payer: BC Managed Care – PPO | Attending: Surgery | Admitting: Surgery

## 2020-12-24 DIAGNOSIS — Z79899 Other long term (current) drug therapy: Secondary | ICD-10-CM | POA: Insufficient documentation

## 2020-12-24 DIAGNOSIS — S83232A Complex tear of medial meniscus, current injury, left knee, initial encounter: Secondary | ICD-10-CM | POA: Diagnosis not present

## 2020-12-24 DIAGNOSIS — Z20822 Contact with and (suspected) exposure to covid-19: Secondary | ICD-10-CM | POA: Insufficient documentation

## 2020-12-24 DIAGNOSIS — X58XXXA Exposure to other specified factors, initial encounter: Secondary | ICD-10-CM | POA: Insufficient documentation

## 2020-12-24 DIAGNOSIS — I10 Essential (primary) hypertension: Secondary | ICD-10-CM | POA: Insufficient documentation

## 2020-12-24 DIAGNOSIS — M1712 Unilateral primary osteoarthritis, left knee: Secondary | ICD-10-CM | POA: Insufficient documentation

## 2020-12-24 DIAGNOSIS — Z91013 Allergy to seafood: Secondary | ICD-10-CM | POA: Insufficient documentation

## 2020-12-24 DIAGNOSIS — M6752 Plica syndrome, left knee: Secondary | ICD-10-CM | POA: Insufficient documentation

## 2020-12-24 HISTORY — PX: KNEE ARTHROSCOPY: SHX127

## 2020-12-24 LAB — URINE DRUG SCREEN, QUALITATIVE (ARMC ONLY)
Amphetamines, Ur Screen: NOT DETECTED
Barbiturates, Ur Screen: NOT DETECTED
Benzodiazepine, Ur Scrn: NOT DETECTED
Cannabinoid 50 Ng, Ur ~~LOC~~: POSITIVE — AB
Cocaine Metabolite,Ur ~~LOC~~: NOT DETECTED
MDMA (Ecstasy)Ur Screen: NOT DETECTED
Methadone Scn, Ur: NOT DETECTED
Opiate, Ur Screen: NOT DETECTED
Phencyclidine (PCP) Ur S: NOT DETECTED
Tricyclic, Ur Screen: POSITIVE — AB

## 2020-12-24 SURGERY — ARTHROSCOPY, KNEE
Anesthesia: General | Site: Knee | Laterality: Left

## 2020-12-24 MED ORDER — POTASSIUM CHLORIDE IN NACL 20-0.9 MEQ/L-% IV SOLN
INTRAVENOUS | Status: DC
  Filled 2020-12-24 (×3): qty 1000

## 2020-12-24 MED ORDER — LIDOCAINE HCL (PF) 2 % IJ SOLN
INTRAMUSCULAR | Status: AC
  Filled 2020-12-24: qty 5

## 2020-12-24 MED ORDER — FENTANYL CITRATE (PF) 100 MCG/2ML IJ SOLN
INTRAMUSCULAR | Status: AC
  Administered 2020-12-24: 25 ug via INTRAVENOUS
  Filled 2020-12-24: qty 2

## 2020-12-24 MED ORDER — METOCLOPRAMIDE HCL 10 MG PO TABS: 5.0000 mg | ORAL_TABLET | Freq: Three times a day (TID) | ORAL | Status: DC | PRN

## 2020-12-24 MED ORDER — ONDANSETRON HCL 4 MG PO TABS: 4.0000 mg | ORAL_TABLET | Freq: Four times a day (QID) | ORAL | Status: DC | PRN

## 2020-12-24 MED ORDER — CEFAZOLIN SODIUM-DEXTROSE 2-4 GM/100ML-% IV SOLN
2.0000 g | INTRAVENOUS | Status: AC
  Administered 2020-12-24: 2 g via INTRAVENOUS

## 2020-12-24 MED ORDER — MIDAZOLAM HCL 2 MG/2ML IJ SOLN
INTRAMUSCULAR | Status: DC | PRN
  Administered 2020-12-24: 2 mg via INTRAVENOUS

## 2020-12-24 MED ORDER — FAMOTIDINE 20 MG PO TABS
20.0000 mg | ORAL_TABLET | Freq: Once | ORAL | Status: AC
  Administered 2020-12-24: 20 mg via ORAL

## 2020-12-24 MED ORDER — LIDOCAINE HCL (CARDIAC) PF 100 MG/5ML IV SOSY
PREFILLED_SYRINGE | INTRAVENOUS | Status: DC | PRN
  Administered 2020-12-24: 100 mg via INTRAVENOUS

## 2020-12-24 MED ORDER — BUPIVACAINE-EPINEPHRINE (PF) 0.5% -1:200000 IJ SOLN
INTRAMUSCULAR | Status: AC
  Filled 2020-12-24: qty 60

## 2020-12-24 MED ORDER — BUPIVACAINE-EPINEPHRINE (PF) 0.5% -1:200000 IJ SOLN
INTRAMUSCULAR | Status: DC | PRN
  Administered 2020-12-24: 30 mL
  Administered 2020-12-24: 20 mL

## 2020-12-24 MED ORDER — PROPOFOL 10 MG/ML IV BOLUS
INTRAVENOUS | Status: DC | PRN
  Administered 2020-12-24: 200 mg via INTRAVENOUS
  Administered 2020-12-24: 50 mg via INTRAVENOUS

## 2020-12-24 MED ORDER — HYDROCODONE-ACETAMINOPHEN 5-325 MG PO TABS: 1.0000 | ORAL_TABLET | ORAL | Status: DC | PRN

## 2020-12-24 MED ORDER — LIDOCAINE HCL 1 % IJ SOLN
INTRAMUSCULAR | Status: DC | PRN
  Administered 2020-12-24: 30 mL

## 2020-12-24 MED ORDER — METOCLOPRAMIDE HCL 5 MG/ML IJ SOLN: 5.0000 mg | Freq: Three times a day (TID) | INTRAMUSCULAR | Status: DC | PRN

## 2020-12-24 MED ORDER — LACTATED RINGERS IV SOLN: INTRAVENOUS | Status: DC

## 2020-12-24 MED ORDER — ORAL CARE MOUTH RINSE: 15.0000 mL | Freq: Once | OROMUCOSAL | Status: AC

## 2020-12-24 MED ORDER — MIDAZOLAM HCL 2 MG/2ML IJ SOLN
INTRAMUSCULAR | Status: AC
  Filled 2020-12-24: qty 2

## 2020-12-24 MED ORDER — DEXAMETHASONE SODIUM PHOSPHATE 10 MG/ML IJ SOLN
INTRAMUSCULAR | Status: DC | PRN
  Administered 2020-12-24: 5 mg via INTRAVENOUS

## 2020-12-24 MED ORDER — ONDANSETRON HCL 4 MG/2ML IJ SOLN: 4.0000 mg | Freq: Four times a day (QID) | INTRAMUSCULAR | Status: DC | PRN

## 2020-12-24 MED ORDER — ONDANSETRON HCL 4 MG/2ML IJ SOLN
INTRAMUSCULAR | Status: DC | PRN
  Administered 2020-12-24: 4 mg via INTRAVENOUS

## 2020-12-24 MED ORDER — LIDOCAINE HCL (PF) 1 % IJ SOLN
INTRAMUSCULAR | Status: AC
  Filled 2020-12-24: qty 30

## 2020-12-24 MED ORDER — CHLORHEXIDINE GLUCONATE 0.12 % MT SOLN
OROMUCOSAL | Status: AC
  Administered 2020-12-24: 15 mL via OROMUCOSAL
  Filled 2020-12-24: qty 15

## 2020-12-24 MED ORDER — ONDANSETRON HCL 4 MG/2ML IJ SOLN: 4.0000 mg | Freq: Once | INTRAMUSCULAR | Status: DC | PRN

## 2020-12-24 MED ORDER — FENTANYL CITRATE (PF) 100 MCG/2ML IJ SOLN
INTRAMUSCULAR | Status: DC | PRN
  Administered 2020-12-24 (×2): 50 ug via INTRAVENOUS

## 2020-12-24 MED ORDER — CEFAZOLIN SODIUM-DEXTROSE 2-4 GM/100ML-% IV SOLN
INTRAVENOUS | Status: AC
  Filled 2020-12-24: qty 100

## 2020-12-24 MED ORDER — FAMOTIDINE 20 MG PO TABS
ORAL_TABLET | ORAL | Status: AC
  Filled 2020-12-24: qty 1

## 2020-12-24 MED ORDER — PROPOFOL 10 MG/ML IV BOLUS
INTRAVENOUS | Status: AC
  Filled 2020-12-24: qty 20

## 2020-12-24 MED ORDER — CHLORHEXIDINE GLUCONATE 0.12 % MT SOLN: 15.0000 mL | Freq: Once | OROMUCOSAL | Status: AC

## 2020-12-24 MED ORDER — HYDROCODONE-ACETAMINOPHEN 5-325 MG PO TABS: 1.0000 | ORAL_TABLET | Freq: Four times a day (QID) | ORAL | 0 refills | Status: DC | PRN

## 2020-12-24 MED ORDER — PHENYLEPHRINE HCL (PRESSORS) 10 MG/ML IV SOLN
INTRAVENOUS | Status: DC | PRN
  Administered 2020-12-24 (×3): 100 ug via INTRAVENOUS

## 2020-12-24 MED ORDER — HYDROCODONE-ACETAMINOPHEN 5-325 MG PO TABS
ORAL_TABLET | ORAL | Status: AC
  Administered 2020-12-24: 1 via ORAL
  Filled 2020-12-24: qty 1

## 2020-12-24 MED ORDER — FENTANYL CITRATE (PF) 100 MCG/2ML IJ SOLN
INTRAMUSCULAR | Status: AC
  Filled 2020-12-24: qty 2

## 2020-12-24 MED ORDER — FENTANYL CITRATE (PF) 100 MCG/2ML IJ SOLN
25.0000 ug | INTRAMUSCULAR | Status: DC | PRN
  Administered 2020-12-24 (×3): 25 ug via INTRAVENOUS

## 2020-12-24 SURGICAL SUPPLY — 40 items
APL PRP STRL LF DISP 70% ISPRP (MISCELLANEOUS) ×1
ARTHREX 3.8MM BONE CUTTER ×1 IMPLANT
BAG COUNTER SPONGE EZ (MISCELLANEOUS) IMPLANT
BAG SPNG 4X4 CLR HAZ (MISCELLANEOUS)
BLADE FULL RADIUS 3.5 (BLADE) ×2 IMPLANT
BLADE SHAVER 4.5X7 STR FR (MISCELLANEOUS) ×2 IMPLANT
BNDG ELASTIC 6X5.8 VLCR STR LF (GAUZE/BANDAGES/DRESSINGS) ×2 IMPLANT
CHLORAPREP W/TINT 26 (MISCELLANEOUS) ×2 IMPLANT
COVER WAND RF STERILE (DRAPES) ×2 IMPLANT
CUFF TOURN SGL QUICK 24 (TOURNIQUET CUFF)
CUFF TOURN SGL QUICK 30 (TOURNIQUET CUFF)
CUFF TRNQT CYL 24X4X16.5-23 (TOURNIQUET CUFF) IMPLANT
CUFF TRNQT CYL 30X4X21-28X (TOURNIQUET CUFF) IMPLANT
DRAPE IMP U-DRAPE 54X76 (DRAPES) ×2 IMPLANT
ELECT REM PT RETURN 9FT ADLT (ELECTROSURGICAL) ×2
ELECTRODE REM PT RTRN 9FT ADLT (ELECTROSURGICAL) ×1 IMPLANT
GAUZE SPONGE 4X4 12PLY STRL (GAUZE/BANDAGES/DRESSINGS) ×2 IMPLANT
GLOVE SURG ENC MOIS LTX SZ8 (GLOVE) ×4 IMPLANT
GLOVE SURG ENC TEXT LTX SZ7 (GLOVE) ×4 IMPLANT
GLOVE SURG UNDER LTX SZ8 (GLOVE) ×2 IMPLANT
GLOVE SURG UNDER POLY LF SZ7.5 (GLOVE) ×2 IMPLANT
GOWN STRL REUS W/ TWL LRG LVL3 (GOWN DISPOSABLE) ×1 IMPLANT
GOWN STRL REUS W/ TWL XL LVL3 (GOWN DISPOSABLE) ×2 IMPLANT
GOWN STRL REUS W/TWL LRG LVL3 (GOWN DISPOSABLE) ×2
GOWN STRL REUS W/TWL XL LVL3 (GOWN DISPOSABLE) ×4
IV LACTATED RINGER IRRG 3000ML (IV SOLUTION) ×2
IV LR IRRIG 3000ML ARTHROMATIC (IV SOLUTION) ×1 IMPLANT
KIT TURNOVER KIT A (KITS) ×2 IMPLANT
MANIFOLD NEPTUNE II (INSTRUMENTS) ×4 IMPLANT
NDL HYPO 21X1.5 SAFETY (NEEDLE) ×1 IMPLANT
NEEDLE HYPO 21X1.5 SAFETY (NEEDLE) ×2 IMPLANT
PACK ARTHROSCOPY KNEE (MISCELLANEOUS) ×2 IMPLANT
PENCIL ELECTRO HAND CTR (MISCELLANEOUS) ×2 IMPLANT
PORT APPOLLO RF 90DEGREE MULTI (SURGICAL WAND) ×1 IMPLANT
SUT PROLENE 4 0 PS 2 18 (SUTURE) ×3 IMPLANT
SUT TICRON COATED BLUE 2 0 30 (SUTURE) IMPLANT
SYR 50ML LL SCALE MARK (SYRINGE) ×2 IMPLANT
TUBING ARTHRO INFLOW-ONLY STRL (TUBING) ×2 IMPLANT
TUBING CONNECTING 10 (TUBING) ×1 IMPLANT
WAND WEREWOLF FLOW 90D (MISCELLANEOUS) ×2 IMPLANT

## 2020-12-24 NOTE — Discharge Instructions (Addendum)
AMBULATORY SURGERY  DISCHARGE INSTRUCTIONS   1) The drugs that you were given will stay in your system until tomorrow so for the next 24 hours you should not:  A) Drive an automobile B) Make any legal decisions C) Drink any alcoholic beverage   2) You may resume regular meals tomorrow.  Today it is better to start with liquids and gradually work up to solid foods.  You may eat anything you prefer, but it is better to start with liquids, then soup and crackers, and gradually work up to solid foods.   3) Please notify your doctor immediately if you have any unusual bleeding, trouble breathing, redness and pain at the surgery site, drainage, fever, or pain not relieved by medication.    4) Additional Instructions:        Please contact your physician with any problems or Same Day Surgery at [phone], Monday through Friday 6 am to 4 pm, or [place] at [facility] number at [phone].Orthopedic discharge instructions: Keep dressing dry and intact.  May shower after dressing changed on post-op day #4 (Monday).  Cover sutures with Band-Aids after drying off. Apply ice frequently to knee. Take ibuprofen 600-800 mg TID with meals for 7-10 days, then as necessary. Take pain medication as prescribed or ES Tylenol when needed.  May weight-bear as tolerated - use crutches or walker as needed. Follow-up in 10-14 days or as scheduled.

## 2020-12-24 NOTE — Op Note (Signed)
12/24/2020  9:09 AM  Patient:   Stephen Ray  Pre-Op Diagnosis:   Complex medial meniscus tear with underlying degenerative joint disease, left knee.  Postoperative diagnosis:   Complex medial meniscus tear and symptomatic suprapatella plica with underlying degenerative joint disease, left knee.  Procedure:   Arthroscopic partial medial meniscectomy, arthroscopic debridement of suprapatellar plica, and abrasion chondroplasty of medial patellar facet, medial femoral condyle, and lateral tibial plateau, left knee.  Surgeon:   Pascal Lux, MD  Anesthesia:   General LMA  Findings:   As above. There were focal grade 3 chondromalacial changes involving the medial patellar facet, consistent with a "kissing lesion". There also were grade 3 chondromalacial changes involving the central weightbearing surface of the medial femoral condyle and lateral tibial plateau. There were grade 4 chondromalacial changes involving the medial edge of the medial tibial plateau. The lateral femoral condyle and femoral trochlea reticular surfaces were in satisfactory condition. The lateral meniscus was in satisfactory condition, as were the anterior and posterior cruciate ligaments.  Complications:   None  EBL:   5 cc.  Total fluids:   800 cc of crystalloid.  Tourniquet time:   None  Drains:   None  Closure:   4-0 Prolene interrupted sutures.  Brief clinical note:   The patient is a 55 year old male with a history of progressive worsening medial sided left knee pain. His symptoms have progressed despite medications, activity modification, etc. His history and examination are consistent with a medial meniscus tear, confirmed by MRI scan. The MRI scan also demonstrated evidence of focal degenerative changes of involving the medial compartment. The patient presents at this time for arthroscopy, debridement, and partial medial meniscectomy.  Procedure:   The patient was brought into the operating room and lain  in the supine position. After adequate general laryngeal mask anesthesia was obtained, a timeout was performed to verify the appropriate side. The patient's left knee was injected sterilely using a solution of 30 cc of 1% lidocaine and 30 cc of 0.5% Sensorcaine with epinephrine. The left lower extremity was prepped with ChloraPrep solution before being draped sterilely. Preoperative antibiotics were administered. The expected portal sites were injected with 0.5% Sensorcaine with epinephrine before the camera was placed in the anterolateral portal and instrumentation performed through the anteromedial portal.   The knee was sequentially examined beginning in the suprapatellar pouch, then progressing to the patellofemoral space, the medial gutter and compartment, the notch, and finally the lateral compartment and gutter. The findings were as described above. Abundant reactive synovial tissues anteriorly were debrided using the full-radius resector in order to improve visualization. This debridement demonstrated a very large thickened suprapatellar plica which appeared to be causing abrasive wear to the medial patellar facet. Therefore, the plica was debrided back to the medial capsular attachment. The medial meniscal tear was identified in the posterior horn and the unstable portion debrided back to stable margins using a combination of the baskets and full-radius resector. Subsequent probing of the remaining rim demonstrated excellent stability. Finally, areas of grade 3 chondromalacial changes involving the medial patellar facet, the medial femoral condyle, and the lateral tibial plateau were debrided back to stable margins using the full-radius resector. The instruments were removed from the joint after suctioning the excess fluid.   The portal sites were closed using 4-0 Prolene interrupted sutures before a sterile bulky dressing was applied to the knee. The patient was then awakened, extubated, and returned  to the recovery room in satisfactory condition after  tolerating the procedure well.

## 2020-12-24 NOTE — Anesthesia Procedure Notes (Signed)
Procedure Name: LMA Insertion Performed by: Fredderick Phenix, CRNA Pre-anesthesia Checklist: Patient identified, Emergency Drugs available, Suction available and Patient being monitored Patient Re-evaluated:Patient Re-evaluated prior to induction Oxygen Delivery Method: Circle system utilized Preoxygenation: Pre-oxygenation with 100% oxygen Induction Type: IV induction Ventilation: Mask ventilation without difficulty LMA: LMA inserted LMA Size: 3.5 Tube type: Oral Number of attempts: 1 Airway Equipment and Method: Oral airway Placement Confirmation: positive ETCO2 and breath sounds checked- equal and bilateral Tube secured with: Tape Dental Injury: Teeth and Oropharynx as per pre-operative assessment

## 2020-12-24 NOTE — Anesthesia Postprocedure Evaluation (Signed)
Anesthesia Post Note  Patient: Stephen Ray  Procedure(s) Performed: ARTHROSCOPY KNEE WITH DEBRIDEMENT AND PARTIAL MENISCECTOMY (Left Knee)  Patient location during evaluation: PACU Anesthesia Type: General Level of consciousness: awake and alert Pain management: pain level controlled Vital Signs Assessment: post-procedure vital signs reviewed and stable Respiratory status: spontaneous breathing and respiratory function stable Cardiovascular status: stable Anesthetic complications: no   No complications documented.   Last Vitals:  Vitals:   12/24/20 0900 12/24/20 0915  BP: (!) 154/98 (!) 139/98  Pulse: 99 90  Resp: 10 10  Temp:    SpO2: 97% 93%    Last Pain:  Vitals:   12/24/20 0915  TempSrc:   PainSc: 6                  , K

## 2020-12-24 NOTE — H&P (Signed)
History of Present Illness: Stephen Ray. is a 55 y.o.male who is being seen for a new patient visit for left knee pain. The symptoms began about 6 months ago and developed without any specific cause or injury. He saw Vance Peper, PA, who gave him a steroid injection which he states provided relief for only 2 to 3 weeks before his symptoms recurred. Since then, he has been trying to "deal with it". However, his symptoms are worsened over the past several weeks, prompting him to make this appointment. He reports 6/10 pain on today's visit. The pain is located along the medial and posterior aspect of the knee. The pain is described as aching, stabbing and throbbing. The symptoms are aggravated using stairs, at higher levels of activity, walking, standing and standing pivot. He also describes no mechanical symptoms. He has associated mild swelling and no deformity. He has tried over-the-counter medications, anti-inflammatories, narcotic medications and steroid injections with limited benefit.  Current Outpatient Medications: . amLODIPine (NORVASC) 10 MG tablet Take 1 tablet by mouth once daily  . atorvastatin (LIPITOR) 40 MG tablet Take 40 mg by mouth once daily  . DULoxetine (CYMBALTA) 30 MG DR capsule Take 30 mg by mouth once daily  . gabapentin (NEURONTIN) 300 MG capsule Take 600 mg by mouth 3 (three) times daily  . lisinopriL (ZESTRIL) 40 MG tablet  . oxyCODONE (ROXICODONE) 5 MG immediate release tablet Take 1-2 tablets (5-10 mg total) by mouth every 6 (six) hours as needed for Pain for up to 15 doses (Patient not taking: Reported on 10/13/2020 ) 15 tablet 0  . oxyCODONE (ROXICODONE) 5 MG immediate release tablet Take 1 tablet (5 mg total) by mouth every 6 (six) hours as needed for Pain for up to 15 doses (Patient not taking: Reported on 10/13/2020 ) 15 tablet 0   Allergies:  . Shellfish Containing Products Anaphylaxis   Past Medical History:  . Hypercholesteremia  . Hypertension  . Neuropathy    Past Surgical History:  . back fusion  . HERNIA REPAIR  . INT NEUROLYSIS Right 07/20/2020  Procedure: Procedure: Right ulnar fascicle to triceps branch nerve transfer; Surgeon: Haroldine Laws, MD; Location: [facility] OR; Service: Neurosurgery; Laterality: Right;  . kidney stones removed  . Left total hemiarthroplasty Left 08/20/2015 (Dr.)  . NERVE PEDICLE TRANSFER 1ST STAGE Right 07/20/2020  Procedure: Procedure: Right ulnar fascicle to triceps branch nerve transfer; Surgeon: Haroldine Laws, MD; Location: [facility] OR; Service: Neurosurgery; Laterality: Right;  . Right shoulder hemiarthroplasty with curettage/debridement of glenoid cyst, right shoulder Right 12/25/2014 (Dr.)   Family History:  . Stroke Mother  . Anesthesia problems Neg Hx   Social History:   Socioeconomic History:  Marland Kitchen Marital status: Married  Spouse name: Not on file  . Number of children: Not on file  . Years of education: Not on file  . Highest education level: Not on file  Occupational History  . 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 Smoking status: Never Smoker  . Smokeless tobacco: Never Used  Vaping Use  . Vaping Use: Never used  Substance and Sexual Activity  . Alcohol use: Yes  . Drug use: Not Currently  . Sexual activity: Not Currently  Other Topics Concern  . Not on file  Social History Narrative  . Not on file   Social Determinants of Health:   Financial Resource Strain: Not on file  Food Insecurity: Not on file  Transportation Needs: Not on file   Review of  Systems:  A comprehensive 14 point ROS was performed, reviewed, and the pertinent orthopaedic findings are documented in the HPI.  Physical Exam: Vitals:  11/16/20 0922  BP: (!) 146/96  Weight: (!) 102.5 kg (226 lb)  Height: 177.8 cm (5\' 10" )  PainSc: 6  PainLoc: Knee   General/Constitutional: The patient appears to be well-nourished, well-developed, and in no acute distress. Neuro/Psych: Normal mood and  affect, oriented to person, place and time. Eyes: Non-icteric. Pupils are equal, round, and reactive to light, and exhibit synchronous movement. Lymphatic: No palpable adenopathy. Respiratory: No wheezes and Non-labored breathing Cardiovascular: No edema, swelling or tenderness, except as noted in detailed exam. Vascular: No edema, swelling or tenderness, except as noted in detailed exam. Integumentary: No impressive skin lesions present, except as noted in detailed exam. Musculoskeletal: Unremarkable, except as noted in detailed exam.  Left knee exam: GAIT: normal and uses no assistive devices. ALIGNMENT: normal SKIN: unremarkable SWELLING: minimal EFFUSION: trace WARMTH: no warmth TENDERNESS: moderate over the medial joint line ROM: 0 to 125 degrees with mild pain in maximal flexion. McMURRAY'S: positive PATELLOFEMORAL: normal tracking with no peri-patellar tenderness and negative apprehension sign CREPITUS: no LACHMAN'S: negative PIVOT SHIFT: negative ANTERIOR DRAWER: negative POSTERIOR DRAWER: negative VARUS/VALGUS: stable  He is neurovascularly intact to the left lower extremity and foot  Knee Imaging: Recent AP weightbearing of both knees, as well as lateral and merchant views of the left knee are available for review. These films demonstrate at most minimal degenerative changes as manifest by minimal medial compartment clear space narrowing. No significant osteophyte formation is noted. The lateral patellofemoral compartments are well-maintained. Overall alignment is neutral. No fractures, lytic lesions, or abnormal calcifications are noted.  Assessment:  1. Complex tear of medial meniscus of left knee.   Plan: The treatment options were discussed with the patient. In addition, patient educational materials were provided regarding the diagnosis and treatment options. The patient is quite frustrated by symptoms and functional limitations, and is ready to consider more  aggressive treatment options. Therefore, I have recommended that we proceed with obtaining an MRI scan to evaluate for meniscal pathology. All of the patient's questions and concerns were answered. He can call any time with further concerns. He will follow up in 2 to 3 weeks to review the results of his MRI scan.   H&P reviewed and patient re-examined. No changes.

## 2020-12-24 NOTE — Transfer of Care (Signed)
Immediate Anesthesia Transfer of Care Note  Patient: Stephen Ray  Procedure(s) Performed: ARTHROSCOPY KNEE WITH DEBRIDEMENT AND PARTIAL MENISCECTOMY (Left Knee)  Patient Location: PACU  Anesthesia Type:General  Level of Consciousness: awake  Airway & Oxygen Therapy: Patient Spontanous Breathing and Patient connected to face mask oxygen  Post-op Assessment: Report given to RN and Post -op Vital signs reviewed and stable  Post vital signs: Reviewed and stable  Last Vitals:  Vitals Value Taken Time  BP 142/102 12/24/20 0853  Temp 37.1 C 12/24/20 0853  Pulse 101 12/24/20 0856  Resp 10 12/24/20 0856  SpO2 97 % 12/24/20 0856  Vitals shown include unvalidated device data.  Last Pain:  Vitals:   12/24/20 0606  TempSrc: Temporal  PainSc: 8          Complications: No complications documented.

## 2020-12-24 NOTE — Anesthesia Preprocedure Evaluation (Signed)
Anesthesia Evaluation  Patient identified by MRN, date of birth, ID band Patient awake    Reviewed: Allergy & Precautions, NPO status , Patient's Chart, lab work & pertinent test results  History of Anesthesia Complications Negative for: history of anesthetic complications  Airway Mallampati: III       Dental   Pulmonary neg sleep apnea, neg COPD, Not current smoker, former smoker,           Cardiovascular hypertension, Pt. on medications (-) Past MI and (-) CHF (-) dysrhythmias (-) Valvular Problems/Murmurs     Neuro/Psych neg Seizures Anxiety    GI/Hepatic neg GERD  ,(+) Hepatitis -, B  Endo/Other  neg diabetes  Renal/GU Renal disease (stones)     Musculoskeletal   Abdominal   Peds  Hematology   Anesthesia Other Findings   Reproductive/Obstetrics                             Anesthesia Physical Anesthesia Plan  ASA: III  Anesthesia Plan: General   Post-op Pain Management:    Induction:   PONV Risk Score and Plan: 2 and Ondansetron and Dexamethasone  Airway Management Planned: LMA  Additional Equipment:   Intra-op Plan:   Post-operative Plan:   Informed Consent: I have reviewed the patients History and Physical, chart, labs and discussed the procedure including the risks, benefits and alternatives for the proposed anesthesia with the patient or authorized representative who has indicated his/her understanding and acceptance.       Plan Discussed with:   Anesthesia Plan Comments:         Anesthesia Quick Evaluation

## 2020-12-25 ENCOUNTER — Encounter: Payer: Self-pay | Admitting: Surgery

## 2021-01-07 ENCOUNTER — Ambulatory Visit: Payer: Self-pay | Admitting: Family Medicine

## 2021-01-22 ENCOUNTER — Ambulatory Visit: Payer: Self-pay | Admitting: Family Medicine

## 2021-02-18 ENCOUNTER — Encounter: Payer: Self-pay | Admitting: Family Medicine

## 2021-02-18 ENCOUNTER — Telehealth (INDEPENDENT_AMBULATORY_CARE_PROVIDER_SITE_OTHER): Payer: BC Managed Care – PPO | Admitting: Family Medicine

## 2021-02-18 ENCOUNTER — Other Ambulatory Visit: Payer: Self-pay

## 2021-02-18 VITALS — BP 120/80

## 2021-02-18 DIAGNOSIS — E782 Mixed hyperlipidemia: Secondary | ICD-10-CM | POA: Diagnosis not present

## 2021-02-18 NOTE — Progress Notes (Signed)
MyChart Video Visit    Virtual Visit via Video Note   This visit type was conducted due to national recommendations for restrictions regarding the COVID-19 Pandemic (e.g. social distancing) in an effort to limit this patient's exposure and mitigate transmission in our community. This patient is at least at moderate risk for complications without adequate follow up. This format is felt to be most appropriate for this patient at this time. Physical exam was limited by quality of the video and audio technology used for the visit.   Patient location: Home Provider location: Office  I discussed the limitations of evaluation and management by telemedicine and the availability of in person appointments. The patient expressed understanding and agreed to proceed.  Patient: Stephen Ray   DOB: 10/16/1965   55 y.o. Male  MRN: [id number] Visit Date: 02/18/2021  Today's healthcare provider: Vernie Murders, PA-C   No chief complaint on file.  Subjective    HPI  Lipid/Cholesterol, Follow-up  Last lipid panel Other pertinent labs  Lab Results  Component Value Date   CHOL 227 (H) 10/06/2020   HDL 48 10/06/2020   LDLCALC 122 (H) 10/06/2020   TRIG 324 (H) 10/06/2020   CHOLHDL 4.2 01/22/2019   Lab Results  Component Value Date   ALT 39 10/22/2020   AST 19 10/22/2020   PLT 298 10/06/2020   TSH 1.300 10/06/2020     He was last seen for this 6 months ago.  Management since that visit includes working on lifestyle changes.  He reports excellent compliance with treatment. He is not having side effects.   Symptoms: No chest pain No chest pressure/discomfort  No dyspnea No lower extremity edema  No numbness or tingling of extremity No orthopnea  No palpitations No paroxysmal nocturnal dyspnea  No speech difficulty No syncope   Current diet: in general, a "healthy" diet   Current exercise: regularly  The 10-year ASCVD risk score Mikey Bussing DC Brooke Bonito., et al., 2013) is:  7.8%  ---------------------------------------------------------------------------------------------------   Patient Active Problem List   Diagnosis Date Noted  . Chronic hepatitis B ([place]) 11/03/2020  . Lipoma of right axilla 03/27/2019  . Carpal tunnel syndrome, left 10/15/2018  . Lumbar spondylosis 05/11/2018  . Benign neoplasm of cecum   . History of arthroplasty of left shoulder 08/20/2015  . Anxiety 03/30/2015  . S/P rotator cuff repair 03/30/2015  . Essential (primary) hypertension 03/30/2015  . Calculus of kidney 03/30/2015  . Arthritis or polyarthritis, rheumatoid ([place]) 03/30/2015  . Post-traumatic osteoarthritis, left shoulder 03/18/2015  . History of arthroplasty of right shoulder 12/25/2014  . Post-traumatic osteoarthritis of right shoulder 03/20/2014  . Neuropathic pain 08/29/2013  . Chronic pain associated with significant psychosocial dysfunction 01/17/2013  . Recurrent nephrolithiasis 01/17/2013  . Renal colic 54/56/2563   Past Medical History:  Diagnosis Date  . Anxiety   . Arthritis    shoulders (before replacements)  . Asthma    as child  . History of kidney stones   . Hypercholesteremia   . Hypertension   . Motion sickness    deep sea fishing  . Neuropathy   . Shoulder pain    left and right  . Umbilical hernia 8937   Social History   Tobacco Use  . Smoking status: Former Smoker    Types: Cigarettes  . Smokeless tobacco: Never Used  Vaping Use  . Vaping Use: Never used  Substance Use Topics  . Alcohol use: Yes    Alcohol/week: 4.0 standard drinks  Types: 4 Shots of liquor per week    Comment: Occasionally   . Drug use: Not Currently    Types: Marijuana    Comment: last documented use 2017   Allergies  Allergen Reactions  . Shellfish Allergy Anaphylaxis    Medications: Outpatient Medications Prior to Visit  Medication Sig  . amLODipine (NORVASC) 10 MG tablet Take 1 tablet by mouth once daily  . diclofenac Sodium (VOLTAREN) 1 %  GEL Apply 1 application topically 2 (two) times daily as needed (pain).  . DULoxetine (CYMBALTA) 30 MG capsule Take 1 capsule by mouth once daily  . fluticasone (FLONASE) 50 MCG/ACT nasal spray Place 1 spray into both nostrils daily as needed for allergies or rhinitis.  Marland Kitchen gabapentin (NEURONTIN) 300 MG capsule Take 300-600 mg by mouth See admin instructions. Take 300 mg in the morning, 300 mg in the afternoon, and 600 mg in the evening  . HYDROcodone-acetaminophen (NORCO) 5-325 MG tablet Take 1-2 tablets by mouth every 6 (six) hours as needed for moderate pain or severe pain. MAXIMUM TOTAL ACETAMINOPHEN DOSE IS 4000 MG PER DAY  . ibuprofen (ADVIL,MOTRIN) 200 MG tablet Take 600 mg by mouth 2 (two) times daily.  Marland Kitchen lisinopril (ZESTRIL) 40 MG tablet Take 1 tablet by mouth once daily   No facility-administered medications prior to visit.    Review of Systems    Objective    BP 120/80    Physical Exam: WDWN male in no apparent distress.  Head: Normocephalic, atraumatic. Neck: Supple, NROM Respiratory: No apparent distress Psych: Normal mood and affect     Assessment & Plan     1. Mixed hyperlipidemia Has been changing lifestyle and diet. Exercising more and lost 8 lbs in the past 3 months. Did not start statin as ordered. Will recheck labs. - Comprehensive metabolic panel - Lipid panel - TSH - CBC with Differential/Platelet   No follow-ups on file.     I discussed the assessment and treatment plan with the patient. The patient was provided an opportunity to ask questions and all were answered. The patient agreed with the plan and demonstrated an understanding of the instructions.   The patient was advised to call back or seek an in-person evaluation if the symptoms worsen or if the condition fails to improve as anticipated.  I provided 10 minutes of non-face-to-face time during this encounter.  I, Dennis Chrismon, PA-C, have reviewed all documentation for this visit. The  documentation on 02/18/21 for the exam, diagnosis, procedures, and orders are all accurate and complete.   Vernie Murders, PA-C [employer] [phone] (phone) [phone] (fax)  [place]

## 2021-02-20 ENCOUNTER — Other Ambulatory Visit: Payer: Self-pay | Admitting: Family Medicine

## 2021-02-20 NOTE — Telephone Encounter (Signed)
Requested medication (s) are due for refill today: -  Requested medication (s) are on the active medication list: historical med  Last refill:  12/07/20  Future visit scheduled: no  Notes to clinic:  historical provider   Requested Prescriptions  Pending Prescriptions Disp Refills   gabapentin (NEURONTIN) 300 MG capsule [Pharmacy Med Name: Gabapentin 300 MG Oral Capsule] 360 capsule 0    Sig: TAKE 1 CAPSULE BY MOUTH TWICE DAILY AND 2 AT BEDTIME      Neurology: Anticonvulsants - gabapentin Passed - 02/20/2021  6:30 PM      Passed - Valid encounter within last 12 months    Recent Outpatient Visits           2 days ago Mixed hyperlipidemia   [facility], Vickki Muff, PA-C   4 months ago Essential (primary) hypertension   [facility], PA-C   6 months ago Patient left without being seen   [facility], Clearnce Sorrel, PA-C   12 months ago Essential (primary) hypertension   [facility], Vickki Muff, PA-C   1 year ago Left lateral epicondylitis   [facility], Vickki Muff, [place]

## 2021-03-02 LAB — CBC WITH DIFFERENTIAL/PLATELET
Basophils Absolute: 0.1 10*3/uL (ref 0.0–0.2)
Basos: 1 %
EOS (ABSOLUTE): 0.2 10*3/uL (ref 0.0–0.4)
Eos: 4 %
Hematocrit: 40.9 % (ref 37.5–51.0)
Hemoglobin: 14.2 g/dL (ref 13.0–17.7)
Immature Grans (Abs): 0 10*3/uL (ref 0.0–0.1)
Immature Granulocytes: 0 %
Lymphocytes Absolute: 2.2 10*3/uL (ref 0.7–3.1)
Lymphs: 39 %
MCH: 31.4 pg (ref 26.6–33.0)
MCHC: 34.7 g/dL (ref 31.5–35.7)
MCV: 91 fL (ref 79–97)
Monocytes Absolute: 0.5 10*3/uL (ref 0.1–0.9)
Monocytes: 8 %
Neutrophils Absolute: 2.7 10*3/uL (ref 1.4–7.0)
Neutrophils: 48 %
Platelets: 290 10*3/uL (ref 150–450)
RBC: 4.52 x10E6/uL (ref 4.14–5.80)
RDW: 12.6 % (ref 11.6–15.4)
WBC: 5.7 10*3/uL (ref 3.4–10.8)

## 2021-03-02 LAB — COMPREHENSIVE METABOLIC PANEL
ALT: 35 IU/L (ref 0–44)
AST: 22 IU/L (ref 0–40)
Albumin/Globulin Ratio: 1.5 (ref 1.2–2.2)
Albumin: 4.3 g/dL (ref 3.8–4.9)
Alkaline Phosphatase: 103 IU/L (ref 44–121)
BUN/Creatinine Ratio: 17 (ref 9–20)
BUN: 15 mg/dL (ref 6–24)
Bilirubin Total: 0.3 mg/dL (ref 0.0–1.2)
CO2: 23 mmol/L (ref 20–29)
Calcium: 9.5 mg/dL (ref 8.7–10.2)
Chloride: 101 mmol/L (ref 96–106)
Creatinine, Ser: 0.9 mg/dL (ref 0.76–1.27)
Globulin, Total: 2.8 g/dL (ref 1.5–4.5)
Glucose: 130 mg/dL — ABNORMAL HIGH (ref 65–99)
Potassium: 3.6 mmol/L (ref 3.5–5.2)
Sodium: 137 mmol/L (ref 134–144)
Total Protein: 7.1 g/dL (ref 6.0–8.5)
eGFR: 101 mL/min/{1.73_m2} (ref >59)

## 2021-03-02 LAB — LIPID PANEL
Chol/HDL Ratio: 4.4 ratio (ref 0.0–5.0)
Cholesterol, Total: 200 mg/dL — ABNORMAL HIGH (ref 100–199)
HDL: 45 mg/dL (ref >39)
LDL Chol Calc (NIH): 106 mg/dL — ABNORMAL HIGH (ref 0–99)
Triglycerides: 289 mg/dL — ABNORMAL HIGH (ref 0–149)
VLDL Cholesterol Cal: 49 mg/dL — ABNORMAL HIGH (ref 5–40)

## 2021-03-02 LAB — TSH: TSH: 1.04 u[IU]/mL (ref 0.450–4.500)

## 2021-03-03 ENCOUNTER — Encounter: Payer: Self-pay | Admitting: Family Medicine

## 2021-03-09 LAB — HEMOGLOBIN A1C
Est. average glucose Bld gHb Est-mCnc: 128 mg/dL
Hgb A1c MFr Bld: 6.1 % — ABNORMAL HIGH (ref 4.8–5.6)

## 2021-03-09 LAB — SPECIMEN STATUS REPORT

## 2021-03-17 ENCOUNTER — Other Ambulatory Visit: Payer: Self-pay | Admitting: Family Medicine

## 2021-03-17 DIAGNOSIS — F419 Anxiety disorder, unspecified: Secondary | ICD-10-CM

## 2021-03-29 ENCOUNTER — Telehealth: Payer: Self-pay

## 2021-03-29 NOTE — Telephone Encounter (Signed)
Copied from [place] [id number]. Topic: Complaint - Billing/Coding >> Mar 29, 2021 10:00 AM Yvette Rack wrote: DOS: 10/06/20 Details of complaint: Pt stated he continues to be billed for a virtual visit that the office called and changed from an in office visit.  How would the patient like to see this issue resolved? Pt would like billing to be corrected because his normal co-pay is $10 for office visits. Pt questions why he is being charged $100 for a virtual visit   Route to Engineer, building services.

## 2021-04-06 ENCOUNTER — Other Ambulatory Visit: Payer: Self-pay | Admitting: Family Medicine

## 2021-04-29 ENCOUNTER — Encounter: Payer: Self-pay | Admitting: Family Medicine

## 2021-05-05 ENCOUNTER — Other Ambulatory Visit: Payer: Self-pay | Admitting: Family Medicine

## 2021-05-05 DIAGNOSIS — D122 Benign neoplasm of ascending colon: Secondary | ICD-10-CM

## 2021-05-12 ENCOUNTER — Other Ambulatory Visit: Payer: Self-pay

## 2021-05-12 ENCOUNTER — Telehealth: Payer: Self-pay

## 2021-05-12 DIAGNOSIS — Z86010 Personal history of colonic polyps: Secondary | ICD-10-CM

## 2021-05-12 MED ORDER — NA SULFATE-K SULFATE-MG SULF 17.5-3.13-1.6 GM/177ML PO SOLN: 354.0000 mL | Freq: Once | ORAL | 0 refills | Status: AC

## 2021-05-12 NOTE — Telephone Encounter (Signed)
Gastroenterology Pre-Procedure Review  Request Date: 07/06/2021 Requesting Physician: Dr. Allen Norris   PATIENT REVIEW QUESTIONS: The patient responded to the following health history questions as indicated:    1. Are you having any GI issues? no 2. Do you have a personal history of Polyps? Yes history of colon polyps last procedure with wohl  3. Do you have a family history of Colon Cancer or Polyps? no 4. Diabetes Mellitus? no 5. Joint replacements in the past 12 months?no 6. Major health problems in the past 3 months?no 7. Any artificial heart valves, MVP, or defibrillator?no    MEDICATIONS & ALLERGIES:    Patient reports the following regarding taking any anticoagulation/antiplatelet therapy:   Plavix, Coumadin, Eliquis, Xarelto, Lovenox, Pradaxa, Brilinta, or Effient? no Aspirin? no  Patient confirms/reports the following medications:  Current Outpatient Medications  Medication Sig Dispense Refill   amLODipine (NORVASC) 10 MG tablet Take 1 tablet by mouth once daily 90 tablet 0   diclofenac Sodium (VOLTAREN) 1 % GEL Apply 1 application topically 2 (two) times daily as needed (pain).     DULoxetine (CYMBALTA) 30 MG capsule Take 1 capsule by mouth once daily 90 capsule 0   fluticasone (FLONASE) 50 MCG/ACT nasal spray Place 1 spray into both nostrils daily as needed for allergies or rhinitis.     gabapentin (NEURONTIN) 300 MG capsule TAKE 1 CAPSULE BY MOUTH TWICE DAILY AND 2 AT BEDTIME 360 capsule 0   HYDROcodone-acetaminophen (NORCO) 5-325 MG tablet Take 1-2 tablets by mouth every 6 (six) hours as needed for moderate pain or severe pain. MAXIMUM TOTAL ACETAMINOPHEN DOSE IS 4000 MG PER DAY 40 tablet 0   ibuprofen (ADVIL,MOTRIN) 200 MG tablet Take 600 mg by mouth 2 (two) times daily.     lisinopril (ZESTRIL) 40 MG tablet Take 1 tablet by mouth once daily 90 tablet 0   No current facility-administered medications for this visit.    Patient confirms/reports the following allergies:   Allergies  Allergen Reactions   Shellfish Allergy Anaphylaxis    No orders of the defined types were placed in this encounter.   AUTHORIZATION INFORMATION Primary Insurance: 1D#: Group #:  Secondary Insurance: 1D#: Group #:  SCHEDULE INFORMATION: Date:  Time: Location:

## 2021-05-30 ENCOUNTER — Other Ambulatory Visit: Payer: Self-pay | Admitting: Family Medicine

## 2021-05-30 NOTE — Telephone Encounter (Signed)
Requested Prescriptions  Pending Prescriptions Disp Refills  . gabapentin (NEURONTIN) 300 MG capsule [Pharmacy Med Name: Gabapentin 300 MG Oral Capsule] 360 capsule 0    Sig: TAKE 1 CAPSULE BY MOUTH TWICE DAILY AND 2 AT BEDTIME     Neurology: Anticonvulsants - gabapentin Passed - 05/30/2021  2:51 PM      Passed - Valid encounter within last 12 months    Recent Outpatient Visits          3 months ago Mixed hyperlipidemia   [facility], Vickki Muff, PA-C   7 months ago Essential (primary) hypertension   [place], PA-C   9 months ago Patient left without being seen   [place], Clearnce Sorrel, PA-C   1 year ago Essential (primary) hypertension   [facility], Vickki Muff, PA-C   1 year ago Left lateral epicondylitis   [place], PA-C      Future Appointments            In 2 weeks [place], Vickki Muff, PA-C [employer], [place]

## 2021-06-06 ENCOUNTER — Other Ambulatory Visit: Payer: Self-pay | Admitting: Family Medicine

## 2021-06-07 ENCOUNTER — Telehealth: Payer: Self-pay

## 2021-06-07 NOTE — Telephone Encounter (Signed)
Copied from [place] [id number]. Topic: General - Other >> Jun 07, 2021  3:51 PM Leward Quan A wrote: Reason for CRM: Patient wife Jonelle Sidle called in to speak with the office manager about a bill that they received for a Tele visit on 10/06/20 say insurance informed that the code was incorrect. Can be reached at Ph# [phone]

## 2021-06-14 ENCOUNTER — Ambulatory Visit: Payer: Self-pay | Admitting: Family Medicine

## 2021-06-14 ENCOUNTER — Other Ambulatory Visit: Payer: Self-pay | Admitting: Family Medicine

## 2021-06-14 DIAGNOSIS — F419 Anxiety disorder, unspecified: Secondary | ICD-10-CM

## 2021-06-30 ENCOUNTER — Encounter: Payer: Self-pay | Admitting: Family Medicine

## 2021-06-30 ENCOUNTER — Other Ambulatory Visit: Payer: Self-pay

## 2021-06-30 ENCOUNTER — Ambulatory Visit: Payer: BC Managed Care – PPO | Admitting: Family Medicine

## 2021-06-30 VITALS — BP 133/90 | HR 80 | Temp 98.4°F | Ht 70.0 in | Wt 218.7 lb

## 2021-06-30 DIAGNOSIS — R7303 Prediabetes: Secondary | ICD-10-CM | POA: Diagnosis not present

## 2021-06-30 DIAGNOSIS — I10 Essential (primary) hypertension: Secondary | ICD-10-CM

## 2021-06-30 DIAGNOSIS — Z13220 Encounter for screening for lipoid disorders: Secondary | ICD-10-CM

## 2021-06-30 DIAGNOSIS — Z136 Encounter for screening for cardiovascular disorders: Secondary | ICD-10-CM

## 2021-06-30 DIAGNOSIS — Z23 Encounter for immunization: Secondary | ICD-10-CM | POA: Diagnosis not present

## 2021-06-30 DIAGNOSIS — M25562 Pain in left knee: Secondary | ICD-10-CM

## 2021-06-30 DIAGNOSIS — F419 Anxiety disorder, unspecified: Secondary | ICD-10-CM | POA: Diagnosis not present

## 2021-06-30 DIAGNOSIS — G8929 Other chronic pain: Secondary | ICD-10-CM | POA: Insufficient documentation

## 2021-06-30 LAB — POCT GLYCOSYLATED HEMOGLOBIN (HGB A1C): Hemoglobin A1C: 5.7 % — AB (ref 4.0–5.6)

## 2021-06-30 MED ORDER — MELOXICAM 15 MG PO TABS: 15.0000 mg | ORAL_TABLET | Freq: Every day | ORAL | 3 refills | Status: DC

## 2021-06-30 NOTE — Assessment & Plan Note (Signed)
Request refill of Mobic- working well for L knee pain

## 2021-06-30 NOTE — Assessment & Plan Note (Signed)
A1c has improved Is doing an online DM program though his insurance program weekly Congratulated on improvement

## 2021-06-30 NOTE — Assessment & Plan Note (Signed)
Chronic, stable. Continue to monitor.  

## 2021-06-30 NOTE — Progress Notes (Signed)
Established patient visit   Patient: Stephen Ray   DOB: [birth year]   55 y.o. Male  MRN: [id number] Visit Date: 06/30/2021  Today's healthcare provider: Gwyneth Sprout, FNP   Chief Complaint  Patient presents with   Diabetes   Subjective    HPI  Colonoscopy next Tuesday Diabetes Mellitus Type II, Follow-up  Lab Results  Component Value Date   HGBA1C 5.7 (A) 06/30/2021   HGBA1C 6.1 (H) 03/01/2021   HGBA1C 5.9 (H) 10/21/2020   Wt Readings from Last 3 Encounters:  06/30/21 218 lb 11.2 oz (99.2 kg)  12/24/20 222 lb 15.9 oz (101.1 kg)  12/16/20 223 lb (101.2 kg)   Last seen for diabetes 4 months ago. (02/18/21 videovisit; labs done on 03/01/21) Management since then includes Proceed with low fat diet and cholesterol regimen. He reports excellent compliance with treatment. He is not having side effects.  Symptoms: No fatigue No foot ulcerations  No appetite changes No nausea  No paresthesia of the feet  No polydipsia  No polyuria No visual disturbances   No vomiting     Home blood sugar records:  none  Episodes of hypoglycemia? No    Current insulin regiment: none Most Recent Eye Exam: UTD Current exercise: weightlifting and treadmill Current diet habits: low fat/ cholesterol  Pertinent Labs: Lab Results  Component Value Date   CHOL 200 (H) 03/01/2021   HDL 45 03/01/2021   LDLCALC 106 (H) 03/01/2021   TRIG 289 (H) 03/01/2021   CHOLHDL 4.4 03/01/2021   Lab Results  Component Value Date   NA 137 03/01/2021   K 3.6 03/01/2021   CREATININE 0.90 03/01/2021   EGFR 101 03/01/2021   GFRNONAA 88 10/06/2020   GLUCOSE 130 (H) 03/01/2021     ---------------------------------------------------------------------------------------------------    Medications: Outpatient Medications Prior to Visit  Medication Sig Note   amLODipine (NORVASC) 10 MG tablet Take 1 tablet by mouth once daily    atorvastatin (LIPITOR) 40 MG tablet Take 40 mg by mouth daily.     DULoxetine (CYMBALTA) 30 MG capsule Take 1 capsule by mouth once daily    fluticasone (FLONASE) 50 MCG/ACT nasal spray Place 1 spray into both nostrils daily as needed for allergies or rhinitis.    gabapentin (NEURONTIN) 300 MG capsule TAKE 1 CAPSULE BY MOUTH TWICE DAILY AND 2 AT BEDTIME    lisinopril (ZESTRIL) 40 MG tablet Take 1 tablet by mouth once daily    [DISCONTINUED] ibuprofen (ADVIL,MOTRIN) 200 MG tablet Take 600 mg by mouth 2 (two) times daily.    [DISCONTINUED] diclofenac Sodium (VOLTAREN) 1 % GEL Apply 1 application topically 2 (two) times daily as needed (pain). (Patient not taking: Reported on 06/30/2021)    [DISCONTINUED] HYDROcodone-acetaminophen (NORCO) 5-325 MG tablet Take 1-2 tablets by mouth every 6 (six) hours as needed for moderate pain or severe pain. MAXIMUM TOTAL ACETAMINOPHEN DOSE IS 4000 MG PER DAY (Patient not taking: Reported on 06/30/2021)    [DISCONTINUED] meloxicam (MOBIC) 15 MG tablet Take 15 mg by mouth daily with breakfast. (Patient not taking: Reported on 06/30/2021) 06/30/2021: For 30 days   No facility-administered medications prior to visit.    Review of Systems     Objective    BP 133/90 (BP Location: Right Arm, Patient Position: Sitting, Cuff Size: Normal)   Pulse 80   Temp 98.4 F (36.9 C) (Oral)   Ht 5' 10"  (1.778 m)   Wt 218 lb 11.2 oz (99.2 kg)   BMI 31.38  kg/m    Physical Exam Vitals and nursing note reviewed.  Constitutional:      Appearance: Normal appearance. He is obese.  HENT:     Head: Normocephalic and atraumatic.  Eyes:     Pupils: Pupils are equal, round, and reactive to light.  Cardiovascular:     Rate and Rhythm: Normal rate and regular rhythm.     Pulses: Normal pulses.     Heart sounds: Normal heart sounds.  Pulmonary:     Effort: Pulmonary effort is normal.     Breath sounds: Normal breath sounds.  Musculoskeletal:        General: Tenderness present. Normal range of motion.     Cervical back: Normal range of  motion.     Comments: Chronic joint pain- L knee  Skin:    General: Skin is warm and dry.     Capillary Refill: Capillary refill takes less than 2 seconds.  Neurological:     General: No focal deficit present.     Mental Status: He is alert and oriented to person, place, and time. Mental status is at baseline.     Results for orders placed or performed in visit on 06/30/21  POCT HgB A1C  Result Value Ref Range   Hemoglobin A1C 5.7 (A) 4.0 - 5.6 %    Assessment & Plan     Problem List Items Addressed This Visit       Cardiovascular and Mediastinum   Essential (primary) hypertension    Chronic, stable Continue to monitor      Relevant Medications   atorvastatin (LIPITOR) 40 MG tablet     Other   Anxiety    Wants to wean off cymbalta Recommend slow taper 2-4 weeks      Chronic pain of left knee    Request refill of Mobic- working well for L knee pain      Relevant Medications   meloxicam (MOBIC) 15 MG tablet   Prediabetes - Primary    A1c has improved Is doing an online DM program though his insurance program weekly Congratulated on improvement      Relevant Orders   POCT HgB A1C (Completed)   Other Visit Diagnoses     Encounter for lipid screening for cardiovascular disease       Relevant Orders   Lipid panel        Return in about 3 months (around 09/30/2021) for T2DM management.      Vonna Kotyk, FNP, have reviewed all documentation for this visit. The documentation on 06/30/21 for the exam, diagnosis, procedures, and orders are all accurate and complete.   Gwyneth Sprout, [place] [phone] (phone) [phone] (fax)  [place]

## 2021-06-30 NOTE — Assessment & Plan Note (Signed)
Wants to wean off cymbalta Recommend slow taper 2-4 weeks

## 2021-07-01 ENCOUNTER — Telehealth: Payer: Self-pay

## 2021-07-01 NOTE — Telephone Encounter (Signed)
Patient is calling to reschedule colonoscopy

## 2021-07-06 ENCOUNTER — Encounter: Admission: RE | Payer: Self-pay | Source: Home / Self Care

## 2021-07-06 ENCOUNTER — Telehealth: Payer: Self-pay | Admitting: Family Medicine

## 2021-07-06 ENCOUNTER — Ambulatory Visit
Admission: RE | Admit: 2021-07-06 | Payer: BC Managed Care – PPO | Source: Home / Self Care | Admitting: Gastroenterology

## 2021-07-06 SURGERY — COLONOSCOPY WITH PROPOFOL
Anesthesia: General

## 2021-07-06 MED ORDER — AMLODIPINE BESYLATE 10 MG PO TABS: 10.0000 mg | ORAL_TABLET | Freq: Every day | ORAL | 0 refills | Status: DC

## 2021-07-06 NOTE — Telephone Encounter (Signed)
[facility] faxed refill request for the following medications:   amLODipine (NORVASC) 10 MG tablet   Please advise.

## 2021-07-15 ENCOUNTER — Telehealth: Payer: Self-pay

## 2021-07-15 NOTE — Telephone Encounter (Signed)
Pt. Walked into the office on 07/15/21 around 1:15 pm cursing and acting biligerant. He was making threats and calling me the B word. This man continued to call the office after I told him that he did not have an appt. So when I put him on hold he got upset and came to the office. There was several patients in the lobby that were witnesses to this incident.

## 2021-07-16 ENCOUNTER — Telehealth: Payer: Self-pay

## 2021-07-16 ENCOUNTER — Other Ambulatory Visit: Payer: Self-pay | Admitting: Family Medicine

## 2021-07-16 DIAGNOSIS — Z1211 Encounter for screening for malignant neoplasm of colon: Secondary | ICD-10-CM

## 2021-07-16 NOTE — Telephone Encounter (Signed)
Copied from [place] [id number]. Topic: Referral - Question >> Jul 15, 2021  1:47 PM Greggory Keen D wrote: Reason for CRM: Pt called asking for referral to [facility] gastro for a colonoscopy.  CB#  [phone]

## 2021-07-27 ENCOUNTER — Telehealth: Payer: Self-pay

## 2021-07-27 DIAGNOSIS — Z1211 Encounter for screening for malignant neoplasm of colon: Secondary | ICD-10-CM

## 2021-07-27 NOTE — Telephone Encounter (Signed)
Copied from [place] [id number]. Topic: General - Other >> Jul 27, 2021  8:15 AM Leward Quan A wrote: Reason for CRM: Patient called in to inquire of Ms Rollene Rotunda about the referral for a colonoscopy at the [facility] Say that he need to have this done before 09/18/21 please advise Ph#  [phone]

## 2021-07-27 NOTE — Addendum Note (Signed)
Addended by: Minette Headland on: 07/27/2021 04:33 PM   Modules accepted: Orders

## 2021-07-27 NOTE — Telephone Encounter (Signed)
According to telephone encounter marked 10/27 from [place] patient will not be accepted as a patient there due to behavior to staff. Will put in new order for referral. KW

## 2021-08-05 NOTE — Telephone Encounter (Signed)
Pt calling in regards to this referral for [facility] clinic. Pt is requesting to have a call back to discuss this and get an update. Please advise.

## 2021-08-05 NOTE — Telephone Encounter (Signed)
Stephen Ray I see that referral placed 07/23/21 had states that it was sent to [facility], patient is requesting to go to [place] since [facility] he had issues with. Can this be changed or do I need to create another referral?

## 2021-08-17 ENCOUNTER — Telehealth: Payer: Self-pay

## 2021-08-17 NOTE — Telephone Encounter (Signed)
Copied from [place] [id number]. Topic: Referral - Status >> Aug 17, 2021 10:33 AM Robina Ade, Helene Kelp D wrote: Reason for CRM: Patient called and said that he got referred to [facility] and he would like to go to [place] instead for insurance and location purpose. Any questions please call patient back, thanks.

## 2021-09-07 ENCOUNTER — Other Ambulatory Visit: Payer: Self-pay | Admitting: Family Medicine

## 2021-09-07 NOTE — Telephone Encounter (Signed)
LOV: 06/30/2021  NOV: None    Last Refill: 05/30/2021 #360 0 Refills.   Thanks,   -Mickel Baas

## 2021-09-07 NOTE — Telephone Encounter (Signed)
[facility] faxed refill request for the following medications:  gabapentin (NEURONTIN) 300 MG capsule   Please advise.  

## 2021-09-08 MED ORDER — GABAPENTIN 300 MG PO CAPS: ORAL_CAPSULE | ORAL | 0 refills | Status: DC

## 2021-09-15 ENCOUNTER — Other Ambulatory Visit: Payer: Self-pay | Admitting: Family Medicine

## 2021-09-15 DIAGNOSIS — F419 Anxiety disorder, unspecified: Secondary | ICD-10-CM

## 2021-09-15 NOTE — Telephone Encounter (Signed)
Requested medications are due for refill today.  unsure  Requested medications are on the active medications list.  yes  Last refill. 06/14/2021  Future visit scheduled.   no  Notes to clinic.  Per note from visit of 06/30/2021 pt wanted to wean himself off of this medication. Please advise    Requested Prescriptions  Pending Prescriptions Disp Refills   DULoxetine (CYMBALTA) 30 MG capsule [Pharmacy Med Name: DULoxetine HCl 30 MG Oral Capsule Delayed Release Particles] 90 capsule 0    Sig: Take 1 capsule by mouth once daily     Psychiatry: Antidepressants - SNRI Failed - 09/15/2021  3:58 PM      Failed - Last BP in normal range    BP Readings from Last 1 Encounters:  06/30/21 133/90          Passed - Valid encounter within last 6 months    Recent Outpatient Visits           2 months ago Prediabetes   [facility] Gwyneth Sprout, FNP   6 months ago Mixed hyperlipidemia   [place], Vickki Muff, PA-C   11 months ago Essential (primary) hypertension   [facility], Vickki Muff, PA-C   1 year ago Patient left without being seen   [facility], Clearnce Sorrel, PA-C   1 year ago Essential (primary) hypertension   [facility], Vickki Muff, [place]

## 2021-09-16 ENCOUNTER — Telehealth: Payer: Self-pay | Admitting: Family Medicine

## 2021-09-16 MED ORDER — LISINOPRIL 40 MG PO TABS: 40.0000 mg | ORAL_TABLET | Freq: Every day | ORAL | 1 refills | Status: DC

## 2021-09-16 NOTE — Telephone Encounter (Signed)
[facility] faxed refill request for the following medications:  lisinopril (ZESTRIL) 40 MG tablet   Please advise.

## 2021-09-17 NOTE — Telephone Encounter (Signed)
Pt called saying he neve heard back from the office for a referral to [facility] for a GI doctor and getting a colonoscopy with [facility].'  He would like some one to call him back from the office  CB# [phone]

## 2021-09-21 NOTE — Telephone Encounter (Signed)
Referral has already been sent to [facility] gastro department and pt is aware he has an appointment with them on 11/23/21

## 2021-09-21 NOTE — Telephone Encounter (Signed)
Stephen Ray is there anyway we can change in system for referral that was ordered on 11/8 to go to [place]? It looks like no one has reached out to this patient. KW

## 2021-09-29 NOTE — Telephone Encounter (Signed)
FYI

## 2021-10-01 NOTE — Progress Notes (Signed)
Established patient visit   Patient: Stephen Ray   DOB: July 12, 1966   56 y.o. Male  MRN: [id number] Visit Date: 10/04/2021  Today's healthcare provider: Lelon Huh, MD   Chief Complaint  Patient presents with   Hyperlipidemia   Spasms   Subjective    HPI  Lipid/Cholesterol, Follow-up  Last lipid panel Other pertinent labs  Lab Results  Component Value Date   CHOL 200 (H) 03/01/2021   HDL 45 03/01/2021   LDLCALC 106 (H) 03/01/2021   TRIG 289 (H) 03/01/2021   CHOLHDL 4.4 03/01/2021   Lab Results  Component Value Date   ALT 35 03/01/2021   AST 22 03/01/2021   PLT 290 03/01/2021   TSH 1.040 03/01/2021     He was last seen for this 6 months ago.  Management since that visit includes starting atorvastatin.    He reports poor compliance with treatment. He is having side effects. Pt reports having bad muscle cramps with atorvastatin and stopped taking after a few weeks. (Muscle cramps have not improved)  Symptoms: No chest pain No chest pressure/discomfort  No dyspnea No lower extremity edema  No numbness or tingling of extremity No orthopnea  No palpitations No paroxysmal nocturnal dyspnea  No speech difficulty No syncope   Current diet: in general, a "healthy" diet   Current exercise:  Exercising regularly  The 10-year ASCVD risk score (Arnett DK, et al., 2019) is: 6.9%  ---------------------------------------------------------------------------------------------------   Medications: Outpatient Medications Prior to Visit  Medication Sig   amLODipine (NORVASC) 10 MG tablet Take 1 tablet (10 mg total) by mouth daily.   DULoxetine (CYMBALTA) 30 MG capsule Take 1 capsule by mouth once daily   fluticasone (FLONASE) 50 MCG/ACT nasal spray Place 1 spray into both nostrils daily as needed for allergies or rhinitis.   gabapentin (NEURONTIN) 300 MG capsule Take 1 capsule twice daily and 2 at bedtime.   lisinopril (ZESTRIL) 40 MG tablet Take 1 tablet (40 mg  total) by mouth daily.   meloxicam (MOBIC) 15 MG tablet Take 1 tablet (15 mg total) by mouth daily with breakfast.   atorvastatin (LIPITOR) 40 MG tablet Take 40 mg by mouth daily. (Patient not taking: Reported on 10/04/2021)   No facility-administered medications prior to visit.    Review of Systems  Constitutional: Negative.   Respiratory: Negative.    Cardiovascular: Negative.   Gastrointestinal: Negative.   Musculoskeletal:  Positive for myalgias (Pt reports having muscle cramps all over.). Negative for arthralgias, back pain, gait problem, joint swelling, neck pain and neck stiffness.  Neurological:  Negative for dizziness, tremors, speech difficulty, weakness, light-headedness, numbness and headaches.      Objective    BP (!) 125/93 (BP Location: Left Arm, Patient Position: Sitting, Cuff Size: Large)    Pulse 68    Temp 98 F (36.7 C) (Oral)    Wt 214 lb (97.1 kg)    SpO2 99%    BMI 30.71 kg/m    Physical Exam   General: Appearance:    Obese male in no acute distress  Eyes:    PERRL, conjunctiva/corneas clear, EOM's intact       Lungs:     Clear to auscultation bilaterally, respirations unlabored  Heart:    Normal heart rate. Normal rhythm. No murmurs, rubs, or gallops.    MS:   All extremities are intact.    Neurologic:   Awake, alert, oriented x 3. No apparent focal neurological defect.  Assessment & Plan     1. Essential (primary) hypertension Well controlled.  Continue current medications.  Encouarge health diet and regular physical activity.   2. Prediabetes Lab Results  Component Value Date   HGBA1C 5.7 (A) 06/30/2021     3. Mixed hyperlipidemia He stopped the 40mg  atorvastatin after less than a month due to muscle aches, which he thinks may have had more to do with inadequate water intake. He is will to try atorvastatin again if needed. If so will have just take 1/2 tablets of the 40mg  tablets he has left over.  - Lipid panel - Comprehensive  Metabolic Panel (CMET)  4. Myalgia Multifactorial as above.   5. Anxiety He started Cymbalta while going through a divorce a few years ago.  He is interested in weaning off. He can take QOD for 1 month then d/c if doing well.       The entirety of the information documented in the History of Present Illness, Review of Systems and Physical Exam were personally obtained by me. Portions of this information were initially documented by the CMA and reviewed by me for thoroughness and accuracy.     Lelon Huh, MD  [facility] [phone] (phone) [phone] (fax)  [place]

## 2021-10-04 ENCOUNTER — Other Ambulatory Visit: Payer: Self-pay

## 2021-10-04 ENCOUNTER — Ambulatory Visit: Payer: BC Managed Care – PPO | Admitting: Family Medicine

## 2021-10-04 ENCOUNTER — Encounter: Payer: Self-pay | Admitting: Family Medicine

## 2021-10-04 VITALS — BP 125/93 | HR 68 | Temp 98.0°F | Wt 214.0 lb

## 2021-10-04 DIAGNOSIS — M791 Myalgia, unspecified site: Secondary | ICD-10-CM | POA: Diagnosis not present

## 2021-10-04 DIAGNOSIS — F419 Anxiety disorder, unspecified: Secondary | ICD-10-CM

## 2021-10-04 DIAGNOSIS — R7303 Prediabetes: Secondary | ICD-10-CM

## 2021-10-04 DIAGNOSIS — E782 Mixed hyperlipidemia: Secondary | ICD-10-CM

## 2021-10-04 DIAGNOSIS — I10 Essential (primary) hypertension: Secondary | ICD-10-CM

## 2021-10-05 ENCOUNTER — Other Ambulatory Visit: Payer: Self-pay | Admitting: Family Medicine

## 2021-10-05 LAB — COMPREHENSIVE METABOLIC PANEL
ALT: 36 IU/L (ref 0–44)
AST: 16 IU/L (ref 0–40)
Albumin/Globulin Ratio: 2.1 (ref 1.2–2.2)
Albumin: 4.4 g/dL (ref 3.8–4.9)
Alkaline Phosphatase: 82 IU/L (ref 44–121)
BUN/Creatinine Ratio: 14 (ref 9–20)
BUN: 15 mg/dL (ref 6–24)
Bilirubin Total: 0.4 mg/dL (ref 0.0–1.2)
CO2: 26 mmol/L (ref 20–29)
Calcium: 10.1 mg/dL (ref 8.7–10.2)
Chloride: 103 mmol/L (ref 96–106)
Creatinine, Ser: 1.07 mg/dL (ref 0.76–1.27)
Globulin, Total: 2.1 g/dL (ref 1.5–4.5)
Glucose: 104 mg/dL — ABNORMAL HIGH (ref 70–99)
Potassium: 3.7 mmol/L (ref 3.5–5.2)
Sodium: 142 mmol/L (ref 134–144)
Total Protein: 6.5 g/dL (ref 6.0–8.5)
eGFR: 82 mL/min/{1.73_m2} (ref >59)

## 2021-10-05 LAB — LIPID PANEL
Chol/HDL Ratio: 4.3 ratio (ref 0.0–5.0)
Cholesterol, Total: 197 mg/dL (ref 100–199)
HDL: 46 mg/dL (ref >39)
LDL Chol Calc (NIH): 108 mg/dL — ABNORMAL HIGH (ref 0–99)
Triglycerides: 251 mg/dL — ABNORMAL HIGH (ref 0–149)
VLDL Cholesterol Cal: 43 mg/dL — ABNORMAL HIGH (ref 5–40)

## 2021-10-06 ENCOUNTER — Other Ambulatory Visit: Payer: Self-pay | Admitting: Family Medicine

## 2021-12-03 ENCOUNTER — Ambulatory Visit: Payer: BC Managed Care – PPO | Admitting: Family Medicine

## 2021-12-03 ENCOUNTER — Encounter: Payer: Self-pay | Admitting: Family Medicine

## 2021-12-03 ENCOUNTER — Other Ambulatory Visit: Payer: Self-pay

## 2021-12-03 VITALS — BP 129/84 | HR 67 | Temp 98.1°F | Resp 16 | Wt 216.0 lb

## 2021-12-03 DIAGNOSIS — L858 Other specified epidermal thickening: Secondary | ICD-10-CM | POA: Diagnosis not present

## 2021-12-03 DIAGNOSIS — Z23 Encounter for immunization: Secondary | ICD-10-CM | POA: Diagnosis not present

## 2021-12-03 DIAGNOSIS — F5101 Primary insomnia: Secondary | ICD-10-CM

## 2021-12-03 MED ORDER — ZOLPIDEM TARTRATE 10 MG PO TABS: 10.0000 mg | ORAL_TABLET | Freq: Every evening | ORAL | 1 refills | Status: DC | PRN

## 2021-12-03 NOTE — Patient Instructions (Addendum)
Please review the attached list of medications and notify my office if there are any errors.  ? ?The skin lesion on your leg looks like a keratoacanthoma and should resolve without treatment in 2-3 months. However, if it doesn't go away within a month then cal for a referral to a dermatologist to do a skin biopsy.  ?

## 2021-12-03 NOTE — Progress Notes (Signed)
?  ? ?I,Roshena L Chambers,acting as a scribe for Lelon Huh, MD.,have documented all relevant documentation on the behalf of Lelon Huh, MD,as directed by  Lelon Huh, MD while in the presence of Lelon Huh, MD.  ? ? ?Established patient visit ? ? ?Patient: Stephen Ray   DOB: 02-21-1966   56 y.o. Male  MRN: [id number] ?Visit Date: 12/03/2021 ? ?Today's healthcare provider: Lelon Huh, MD  ? ?Chief Complaint  ?Patient presents with  ? Skin Problem  ? Insomnia  ? ?Subjective  ?  ?HPI  ?Skin lesion: ?Patient complains of a dry spot on his right knee. He first noticed the spot 1 month ago. He reports an increase in size over the past month. Associated symptoms include itching and pain at night.    ? ? ?Insomnia: ?Patient reports having trouble staying asleep and falling asleep. This has been a problem for several years. Denies being told he has abnormal sleeping or snoring excessively. States he always sleeps on his side. Tried trazodone when just made his legs restless and did not help sleep. Did take zolpidem a few times several years ago which he states was very effective and he didn't have any side effects.  ? ? ?Medications: ?Outpatient Medications Prior to Visit  ?Medication Sig  ? amLODipine (NORVASC) 10 MG tablet Take 1 tablet by mouth once daily  ? DULoxetine (CYMBALTA) 30 MG capsule Take 1 capsule by mouth once daily  ? gabapentin (NEURONTIN) 300 MG capsule Take 1 capsule twice daily and 2 at bedtime.  ? fluticasone (FLONASE) 50 MCG/ACT nasal spray Place 1 spray into both nostrils daily as needed for allergies or rhinitis. (Patient not taking: Reported on 12/03/2021)  ? lisinopril (ZESTRIL) 40 MG tablet Take 1 tablet (40 mg total) by mouth daily.  ? meloxicam (MOBIC) 15 MG tablet Take 1 tablet (15 mg total) by mouth daily with breakfast. (Patient not taking: Reported on 12/03/2021)  ? ?No facility-administered medications prior to visit.  ? ? ?Review of Systems  ?Constitutional:  Negative for  appetite change, chills and fever.  ?Respiratory:  Negative for chest tightness, shortness of breath and wheezing.   ?Cardiovascular:  Negative for chest pain and palpitations.  ?Gastrointestinal:  Negative for abdominal pain, nausea and vomiting.  ?Skin:   ?     Skin dryness on right knee  ?Psychiatric/Behavioral:  Positive for sleep disturbance.   ? ? ?  Objective  ?  ?BP 129/84 (BP Location: Left Arm, Patient Position: Sitting, Cuff Size: Large)   Pulse 67   Temp 98.1 ?F (36.7 ?C) (Oral)   Resp 16   Wt 216 lb (98 kg)   SpO2 98% Comment: room air  BMI 30.99 kg/m?  ? ? ?Physical Exam  ? ?General appearance: Mildly obese male, cooperative and in no acute distress ?Head: Normocephalic, without obvious abnormality, atraumatic ?Respiratory: Respirations even and unlabored, normal respiratory rate ?Extremities: All extremities are intact.  ?Skin: Skin color, texture, turgor normal. No rashes seen  ?Psych: Appropriate mood and affect. ?Neurologic: Mental status: Alert, oriented to person, place, and time, thought content appropriate.  ? ?  ? ? ? Assessment & Plan  ?  ? ?1. Keratoacanthoma of thigh ?Suspected based on rapid development of lesion. Advised Kas typically resolved after a few months. If it does not resolved the next 2 months then he will need referral to dermatology for biopsy.  ? ?2. Primary insomnia ?Did not tolerated trazodone, but had good response to zolpidem several years ago.  ?-  zolpidem (AMBIEN) 10 MG tablet; Take 1 tablet (10 mg total) by mouth at bedtime as needed for sleep.  Dispense: 15 tablet; Refill: 1 ? ?3. Need for shingles vaccine ? ?- Administer Zoster, Recombinant (Shingrix) Vaccine #1 ? ?4. Need for prophylactic vaccination using tetanus and diphtheria toxoids adsorbed (Td) vaccine ? ?- Tdap vaccine greater than or equal to 7yo IM  ?   ? ?The entirety of the information documented in the History of Present Illness, Review of Systems and Physical Exam were personally obtained by me.  Portions of this information were initially documented by the CMA and reviewed by me for thoroughness and accuracy.   ? ? ?Lelon Huh, MD  ?[facility] ?[phone] (phone) ?[phone] (fax) ? ?[facility]  ?

## 2021-12-06 ENCOUNTER — Other Ambulatory Visit: Payer: Self-pay | Admitting: Family Medicine

## 2021-12-08 NOTE — Telephone Encounter (Signed)
Last refill: 09/08/2021 #360 with 0 refills ? ?Last office visit: 12/03/2021 ?No future visit scheduled ? ?

## 2022-01-19 ENCOUNTER — Other Ambulatory Visit: Payer: Self-pay | Admitting: Family Medicine

## 2022-01-19 DIAGNOSIS — F5101 Primary insomnia: Secondary | ICD-10-CM

## 2022-01-19 NOTE — Telephone Encounter (Signed)
Requested medication (s) are due for refill today: yes ? ?Requested medication (s) are on the active medication list: yes but end date 01/02/22 ? ?Last refill:  12/03/21 ? ?Future visit scheduled: no ? ?Notes to clinic:  med not delegated to NT to RF ? ? ?Requested Prescriptions  ?Pending Prescriptions Disp Refills  ? zolpidem (AMBIEN) 10 MG tablet [Pharmacy Med Name: Zolpidem Tartrate 10 MG Oral Tablet] 15 tablet 0  ?  Sig: TAKE 1 TABLET BY MOUTH AT BEDTIME AS NEEDED FOR SLEEP  ?  ? Not Delegated - Psychiatry:  Anxiolytics/Hypnotics Failed - 01/19/2022 10:25 AM  ?  ?  Failed - This refill cannot be delegated  ?  ?  Failed - Urine Drug Screen completed in last 360 days  ?  ?  Passed - Valid encounter within last 6 months  ?  Recent Outpatient Visits   ? ?      ? 1 month ago Keratoacanthoma of thigh  ? [facility] Birdie Sons, MD  ? 3 months ago Essential (primary) hypertension  ? [facility] Birdie Sons, MD  ? 6 months ago Prediabetes  ? [facility] Gwyneth Sprout, FNP  ? 11 months ago Mixed hyperlipidemia  ? [place], PA-C  ? 1 year ago Essential (primary) hypertension  ? [place], PA-C  ? ?  ?  ? ? ?  ?  ?  ? ? ? ? ?

## 2022-01-21 ENCOUNTER — Ambulatory Visit: Payer: BC Managed Care – PPO | Admitting: Physician Assistant

## 2022-01-21 ENCOUNTER — Encounter: Payer: Self-pay | Admitting: Physician Assistant

## 2022-01-21 VITALS — BP 132/87 | HR 75 | Temp 99.1°F | Ht 70.0 in | Wt 213.6 lb

## 2022-01-21 DIAGNOSIS — J0110 Acute frontal sinusitis, unspecified: Secondary | ICD-10-CM | POA: Diagnosis not present

## 2022-01-21 MED ORDER — METHYLPREDNISOLONE 4 MG PO TBPK: ORAL_TABLET | ORAL | 0 refills | Status: DC

## 2022-01-21 MED ORDER — LORATADINE 10 MG PO TABS: 10.0000 mg | ORAL_TABLET | Freq: Every day | ORAL | 1 refills | Status: DC

## 2022-01-21 MED ORDER — AZELASTINE HCL 0.1 % NA SOLN: 1.0000 | Freq: Two times a day (BID) | NASAL | 2 refills | Status: DC

## 2022-01-21 NOTE — Progress Notes (Signed)
?  ? ?I,Sha'taria Tyson,acting as a Education administrator for [employer], PA-C.,have documented all relevant documentation on the behalf of Mikey Kirschner, PA-C,as directed by  Mikey Kirschner, PA-C while in the presence of Mikey Kirschner, PA-C.  ? ?Established patient visit ? ? ?Patient: Stephen Ray   DOB: 09/14/1966   56 y.o. Male  MRN: [id number] ?Visit Date: 01/21/2022 ? ?Today's healthcare provider: Mikey Kirschner, PA-C  ? ?Cc. Cough, nasal congestion  ? ?Subjective  ?  ?HPI  ?Imraan is a 56 y/o male who presents today with sinus congestion, cough, and fever of tmax 101 yesterday. Reports negative home covid tests last night and this AM. Reports using otc nyquil and sinus nasal spray. He recently pulled a tarp out of his yard that was full of pollen and thinks this is what started his symptoms. ?Denies chest pain, shortness of breath.  ? ?Medications: ?Outpatient Medications Prior to Visit  ?Medication Sig  ? amLODipine (NORVASC) 10 MG tablet Take 1 tablet by mouth once daily  ? DULoxetine (CYMBALTA) 30 MG capsule Take 1 capsule by mouth once daily  ? gabapentin (NEURONTIN) 300 MG capsule TAKE 1 CAPSULE BY MOUTH TWICE DAILY AND 2 AT BEDTIME  ? meloxicam (MOBIC) 15 MG tablet Take 1 tablet (15 mg total) by mouth daily with breakfast.  ? zolpidem (AMBIEN) 10 MG tablet TAKE 1 TABLET BY MOUTH AT BEDTIME AS NEEDED FOR SLEEP  ? lisinopril (ZESTRIL) 40 MG tablet Take 1 tablet (40 mg total) by mouth daily.  ? [DISCONTINUED] fluticasone (FLONASE) 50 MCG/ACT nasal spray Place 1 spray into both nostrils daily as needed for allergies or rhinitis. (Patient not taking: Reported on 12/03/2021)  ? ?No facility-administered medications prior to visit.  ? ? ?Review of Systems  ?Constitutional:  Negative for fatigue and fever.  ?HENT:  Positive for congestion, rhinorrhea, sinus pressure, sinus pain and sore throat.   ?Respiratory:  Positive for wheezing. Negative for cough and shortness of breath.   ?Cardiovascular:  Negative for chest  pain, palpitations and leg swelling.  ?Neurological:  Negative for dizziness and headaches.  ? ? ?  Objective  ?  ?Blood pressure 132/87, pulse 75, temperature 99.1 ?F (37.3 ?C), temperature source Oral, height '5\' 10"'$  (1.778 m), weight 213 lb 9.6 oz (96.9 kg), SpO2 100 %.  ? ?Physical Exam ?Constitutional:   ?   General: He is awake.  ?   Appearance: He is well-developed.  ?HENT:  ?   Head: Normocephalic.  ?   Right Ear: Tympanic membrane normal.  ?   Left Ear: Tympanic membrane normal.  ?   Nose: Congestion present.  ?   Mouth/Throat:  ?   Pharynx: Posterior oropharyngeal erythema present. No oropharyngeal exudate.  ?Eyes:  ?   Conjunctiva/sclera: Conjunctivae normal.  ?Cardiovascular:  ?   Rate and Rhythm: Normal rate and regular rhythm.  ?   Heart sounds: Normal heart sounds.  ?Pulmonary:  ?   Effort: Pulmonary effort is normal.  ?   Breath sounds: Wheezing present.  ?Skin: ?   General: Skin is warm.  ?Neurological:  ?   Mental Status: He is alert and oriented to person, place, and time.  ?Psychiatric:     ?   Attention and Perception: Attention normal.     ?   Mood and Affect: Mood normal.     ?   Speech: Speech normal.     ?   Behavior: Behavior is cooperative.  ?  ? ?No results found for any  visits on 01/21/22. ? Assessment & Plan  ?  ? ?Acute sinusitis, allergic ?Rx azelastine nasal spray ?Rx claritin  ?Rx medrol dose pack ?Ok to take nyquil, increase fluids ? ?Return if symptoms worsen or fail to improve.  ?   ? ?I, Mikey Kirschner, PA-C have reviewed all documentation for this visit. The documentation on  01/21/2022 for the exam, diagnosis, procedures, and orders are all accurate and complete. ? ?Mikey Kirschner, PA-C ?[facility] ?[id number] ?[place], [place], [address] ?Office: [phone] ?Fax: [phone]  ? ?[facility] ? ?

## 2022-02-15 ENCOUNTER — Encounter: Payer: Self-pay | Admitting: Internal Medicine

## 2022-02-16 ENCOUNTER — Ambulatory Visit: Payer: BC Managed Care – PPO | Admitting: Certified Registered Nurse Anesthetist

## 2022-02-16 ENCOUNTER — Encounter
Admission: RE | Disposition: A | Discharge status: Home / Self Care | Payer: Self-pay | Source: Home / Self Care | Attending: Internal Medicine

## 2022-02-16 ENCOUNTER — Ambulatory Visit
Admission: RE | Admit: 2022-02-16 | Discharge: 2022-02-16 | Disposition: A | Discharge status: Home / Self Care | Payer: BC Managed Care – PPO | Attending: Internal Medicine | Admitting: Internal Medicine

## 2022-02-16 ENCOUNTER — Encounter: Payer: Self-pay | Admitting: Internal Medicine

## 2022-02-16 DIAGNOSIS — I10 Essential (primary) hypertension: Secondary | ICD-10-CM | POA: Insufficient documentation

## 2022-02-16 DIAGNOSIS — K640 First degree hemorrhoids: Secondary | ICD-10-CM | POA: Diagnosis not present

## 2022-02-16 DIAGNOSIS — K621 Rectal polyp: Secondary | ICD-10-CM | POA: Insufficient documentation

## 2022-02-16 DIAGNOSIS — F419 Anxiety disorder, unspecified: Secondary | ICD-10-CM | POA: Insufficient documentation

## 2022-02-16 DIAGNOSIS — Z1211 Encounter for screening for malignant neoplasm of colon: Secondary | ICD-10-CM | POA: Insufficient documentation

## 2022-02-16 HISTORY — PX: COLONOSCOPY WITH PROPOFOL: SHX5780

## 2022-02-16 SURGERY — COLONOSCOPY WITH PROPOFOL
Anesthesia: General

## 2022-02-16 MED ORDER — PROPOFOL 500 MG/50ML IV EMUL
INTRAVENOUS | Status: DC | PRN
  Administered 2022-02-16: 150 ug/kg/min via INTRAVENOUS

## 2022-02-16 MED ORDER — SODIUM CHLORIDE 0.9 % IV SOLN: INTRAVENOUS | Status: DC

## 2022-02-16 MED ORDER — PROPOFOL 10 MG/ML IV BOLUS
INTRAVENOUS | Status: DC | PRN
  Administered 2022-02-16: 20 mg via INTRAVENOUS
  Administered 2022-02-16: 50 mg via INTRAVENOUS
  Administered 2022-02-16: 80 mg via INTRAVENOUS

## 2022-02-16 MED ORDER — LIDOCAINE HCL (CARDIAC) PF 100 MG/5ML IV SOSY
PREFILLED_SYRINGE | INTRAVENOUS | Status: DC | PRN
  Administered 2022-02-16: 50 mg via INTRAVENOUS

## 2022-02-16 MED ORDER — DEXMEDETOMIDINE (PRECEDEX) IN NS 20 MCG/5ML (4 MCG/ML) IV SYRINGE
PREFILLED_SYRINGE | INTRAVENOUS | Status: DC | PRN
  Administered 2022-02-16: 8 ug via INTRAVENOUS

## 2022-02-16 NOTE — H&P (Signed)
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  Primary Physician: Lelon Huh, M.D.  Reason for visit:  Personal history of colon polyps  History of present illness:                            Patient presents for colonoscopy for a personal hx of colon polyps. The patient denies abdominal pain, abnormal weight loss or rectal bleeding.      Current Facility-Administered Medications:    0.9 %  sodium chloride infusion, , Intravenous, Continuous, , [name], MD, Last Rate: 20 mL/hr at 02/16/22 1117, Continued from Pre-op at 02/16/22 1117  Medications Prior to Admission  Medication Sig Dispense Refill Last Dose   amLODipine (NORVASC) 10 MG tablet Take 1 tablet by mouth once daily 90 tablet 3 02/16/2022   lisinopril (ZESTRIL) 40 MG tablet Take 1 tablet (40 mg total) by mouth daily. 90 tablet 1 02/16/2022   azelastine (ASTELIN) 0.1 % nasal spray Place 1 spray into both nostrils 2 (two) times daily. Use in each nostril as directed 30 mL 2    DULoxetine (CYMBALTA) 30 MG capsule Take 1 capsule by mouth once daily 90 capsule 1    gabapentin (NEURONTIN) 300 MG capsule TAKE 1 CAPSULE BY MOUTH TWICE DAILY AND 2 AT BEDTIME 360 capsule 4    loratadine (CLARITIN) 10 MG tablet Take 1 tablet (10 mg total) by mouth daily. 90 tablet 1    meloxicam (MOBIC) 15 MG tablet Take 1 tablet (15 mg total) by mouth daily with breakfast. (Patient not taking: Reported on 02/15/2022) 90 tablet 3 Not Taking   methylPREDNISolone (MEDROL DOSEPAK) 4 MG TBPK tablet Take 6 pills on day 1, take 5 pills on day 2, take 4 pills on day 3, take 3 pills on day 4, take 2 pills on day 5, take 1 pill on day 6 1 each 0    zolpidem (AMBIEN) 10 MG tablet TAKE 1 TABLET BY MOUTH AT BEDTIME AS NEEDED FOR SLEEP 15 tablet 1      Allergies  Allergen Reactions   Shellfish Allergy Anaphylaxis     Past Medical History:  Diagnosis Date   Anxiety    Arthritis    shoulders (before replacements)    Asthma    as child   History of kidney stones    Hypercholesteremia    Hypertension    Motion sickness    deep sea fishing   Neuropathy    Shoulder pain    left and right   Umbilical hernia [id number]    Review of systems:  Otherwise negative.    Physical Exam  Gen: Alert, oriented. Appears stated age.  HEENT: [place]/AT. PERRLA. Lungs: CTA, no wheezes. CV: RR nl S1, S2. Abd: soft, benign, no masses. BS+ Ext: No edema. Pulses 2+    Planned procedures: Proceed with colonoscopy. The patient understands the nature of the planned procedure, indications, risks, alternatives and potential complications including but not limited to bleeding, infection, perforation, damage to internal organs and possible oversedation/side effects from anesthesia. The patient agrees and gives consent to proceed.  Please refer to procedure notes for findings, recommendations and patient disposition/instructions.      Stephen Ray, M.D. Gastroenterology 02/16/2022  11:30 AM

## 2022-02-16 NOTE — Anesthesia Preprocedure Evaluation (Signed)
Anesthesia Evaluation  Patient identified by MRN, date of birth, ID band Patient awake    Reviewed: Allergy & Precautions, NPO status , Patient's Chart, lab work & pertinent test results  History of Anesthesia Complications Negative for: history of anesthetic complications  Airway Mallampati: II  TM Distance: >3 FB Neck ROM: Full    Dental no notable dental hx. (+) Teeth Intact   Pulmonary neg pulmonary ROS, neg sleep apnea, neg COPD, Patient abstained from smoking.Not current smoker, former smoker,    Pulmonary exam normal breath sounds clear to auscultation       Cardiovascular Exercise Tolerance: Good METShypertension, Pt. on medications (-) CAD and (-) Past MI (-) dysrhythmias  Rhythm:Regular Rate:Normal - Systolic murmurs    Neuro/Psych PSYCHIATRIC DISORDERS Anxiety negative neurological ROS     GI/Hepatic neg GERD  ,(+)     (-) substance abuse  ,   Endo/Other  neg diabetes  Renal/GU negative Renal ROS     Musculoskeletal   Abdominal   Peds  Hematology   Anesthesia Other Findings Past Medical History: No date: Anxiety No date: Arthritis     Comment:  shoulders (before replacements) No date: Asthma     Comment:  as child No date: History of kidney stones No date: Hypercholesteremia No date: Hypertension No date: Motion sickness     Comment:  deep sea fishing No date: Neuropathy No date: Shoulder pain     Comment:  left and right [id number]: Umbilical hernia  Reproductive/Obstetrics                             Anesthesia Physical Anesthesia Plan  ASA: 2  Anesthesia Plan: General   Post-op Pain Management: Minimal or no pain anticipated   Induction: Intravenous  PONV Risk Score and Plan: 2 and Propofol infusion, TIVA and Ondansetron  Airway Management Planned: Nasal Cannula  Additional Equipment: None  Intra-op Plan:   Post-operative Plan:   Informed Consent: I  have reviewed the patients History and Physical, chart, labs and discussed the procedure including the risks, benefits and alternatives for the proposed anesthesia with the patient or authorized representative who has indicated his/her understanding and acceptance.     Dental advisory given  Plan Discussed with: CRNA and Surgeon  Anesthesia Plan Comments: (Discussed risks of anesthesia with patient, including possibility of difficulty with spontaneous ventilation under anesthesia necessitating airway intervention, PONV, and rare risks such as cardiac or respiratory or neurological events, and allergic reactions. Discussed the role of CRNA in patient's perioperative care. Patient understands.)        Anesthesia Quick Evaluation

## 2022-02-16 NOTE — Transfer of Care (Signed)
Immediate Anesthesia Transfer of Care Note  Patient: Stephen Ray  Procedure(s) Performed: COLONOSCOPY WITH PROPOFOL  Patient Location: PACU  Anesthesia Type:General  Level of Consciousness: awake, alert  and oriented  Airway & Oxygen Therapy: Patient Spontanous Breathing  Post-op Assessment: Report given to RN and Post -op Vital signs reviewed and stable  Post vital signs: Reviewed and stable  Last Vitals:  Vitals Value Taken Time  BP 109/74 02/16/22 1202  Temp    Pulse 84 02/16/22 1202  Resp 18 02/16/22 1202  SpO2 99 % 02/16/22 1202  Vitals shown include unvalidated device data.  Last Pain:  Vitals:   02/16/22 1037  TempSrc: Temporal  PainSc: 0-No pain         Complications: No notable events documented.

## 2022-02-16 NOTE — Op Note (Signed)
[facility] Gastroenterology Patient Name: Stephen Ray Procedure Date: 02/16/2022 11:29 AM MRN: [id number] Account #: [id number] Date of Birth: 04-02-1966 Admit Type: Outpatient Age: 56 Room: [facility] ENDO ROOM 2 Gender: Male Note Status: Finalized Instrument Name: Park Meo [id number] Procedure:             Colonoscopy Indications:           Surveillance: Personal history of adenomatous polyps                         on last colonoscopy > 5 years ago Providers:             Lorie Apley K. Alice Reichert MD, MD Referring MD:          [name]. Alice Reichert MD, MD (Referring MD), Kirstie Peri.                         Caryn Section, MD (Referring MD) Medicines:             Propofol per Anesthesia Complications:         No immediate complications. Procedure:             Pre-Anesthesia Assessment:                        - The risks and benefits of the procedure and the                         sedation options and risks were discussed with the                         patient. All questions were answered and informed                         consent was obtained.                        - Patient identification and proposed procedure were                         verified prior to the procedure by the nurse. The                         procedure was verified in the procedure room.                        - ASA Grade Assessment: III - A patient with severe                         systemic disease.                        - After reviewing the risks and benefits, the patient                         was deemed in satisfactory condition to undergo the                         procedure.  After obtaining informed consent, the colonoscope was                         passed under direct vision. Throughout the procedure,                         the patient's blood pressure, pulse, and oxygen                         saturations were monitored continuously. The                          Colonoscope was introduced through the anus and                         advanced to the the cecum, identified by appendiceal                         orifice and ileocecal valve. The colonoscopy was                         performed without difficulty. The patient tolerated                         the procedure well. The quality of the bowel                         preparation was good. The ileocecal valve, appendiceal                         orifice, and rectum were photographed. Findings:      The perianal and digital rectal examinations were normal. Pertinent       negatives include normal sphincter tone and no palpable rectal lesions.      Non-bleeding internal hemorrhoids were found during retroflexion. The       hemorrhoids were Grade I (internal hemorrhoids that do not prolapse).      A 4 mm polyp was found in the rectum. The polyp was sessile. The polyp       was removed with a jumbo cold forceps. Resection and retrieval were       complete.      The exam was otherwise without abnormality. Impression:            - Non-bleeding internal hemorrhoids.                        - One 4 mm polyp in the rectum, removed with a jumbo                         cold forceps. Resected and retrieved.                        - The examination was otherwise normal. Recommendation:        - Patient has a contact number available for                         emergencies. The signs and symptoms of potential  delayed complications were discussed with the patient.                         Return to normal activities tomorrow. Written                         discharge instructions were provided to the patient.                        - Resume previous diet.                        - Continue present medications.                        - Await pathology results.                        - Repeat colonoscopy is recommended for surveillance.                         The colonoscopy date will  be determined after                         pathology results from today's exam become available                         for review.                        - Return to GI office PRN.                        - The findings and recommendations were discussed with                         the patient. Procedure Code(s):     --- Professional ---                        [phone], Colonoscopy, flexible; with biopsy, single or                         multiple Diagnosis Code(s):     --- Professional ---                        K64.0, First degree hemorrhoids                        K62.1, Rectal polyp                        Z86.010, Personal history of colonic polyps CPT copyright 2019 American Medical Association. All rights reserved. The codes documented in this report are preliminary and upon coder review may  be revised to meet current compliance requirements. Efrain Sella MD, MD 02/16/2022 12:01:02 PM This report has been signed electronically. Number of Addenda: 0 Note Initiated On: 02/16/2022 11:29 AM Scope Withdrawal Time: 0 hours 8 minutes 2 seconds  Total Procedure Duration: 0 hours 10 minutes 56 seconds  Estimated Blood Loss:  Estimated blood loss: none.      [facility]

## 2022-02-16 NOTE — Anesthesia Postprocedure Evaluation (Signed)
Anesthesia Post Note  Patient: Stephen Ray  Procedure(s) Performed: COLONOSCOPY WITH PROPOFOL  Patient location during evaluation: Endoscopy Anesthesia Type: General Level of consciousness: awake and alert Pain management: pain level controlled Vital Signs Assessment: post-procedure vital signs reviewed and stable Respiratory status: spontaneous breathing, nonlabored ventilation, respiratory function stable and patient connected to nasal cannula oxygen Cardiovascular status: blood pressure returned to baseline and stable Postop Assessment: no apparent nausea or vomiting Anesthetic complications: no   No notable events documented.   Last Vitals:  Vitals:   02/16/22 1200 02/16/22 1202  BP: 109/74 109/74  Pulse: 87 81  Resp: 20 (!) 8  Temp: 36.8 C   SpO2: 98% 100%    Last Pain:  Vitals:   02/16/22 1210  TempSrc:   PainSc: 0-No pain                 Arita Miss

## 2022-02-16 NOTE — Interval H&P Note (Signed)
History and Physical Interval Note:  02/16/2022 11:32 AM  Stephen Ray  has presented today for surgery, with the diagnosis of personal history adenomatous polyp.  The various methods of treatment have been discussed with the patient and family. After consideration of risks, benefits and other options for treatment, the patient has consented to  Procedure(s): COLONOSCOPY WITH PROPOFOL (N/A) as a surgical intervention.  The patient's history has been reviewed, patient examined, no change in status, stable for surgery.  I have reviewed the patient's chart and labs.  Questions were answered to the patient's satisfaction.     [place], [place]

## 2022-02-16 NOTE — Anesthesia Procedure Notes (Signed)
Date/Time: 02/16/2022 11:37 AM Performed by: Johnna Acosta, CRNA Pre-anesthesia Checklist: Patient identified, Emergency Drugs available, Suction available, Patient being monitored and Timeout performed Patient Re-evaluated:Patient Re-evaluated prior to induction Oxygen Delivery Method: Nasal cannula Preoxygenation: Pre-oxygenation with 100% oxygen Induction Type: IV induction

## 2022-02-17 ENCOUNTER — Encounter: Payer: Self-pay | Admitting: Internal Medicine

## 2022-02-17 LAB — SURGICAL PATHOLOGY

## 2022-03-08 ENCOUNTER — Other Ambulatory Visit: Payer: Self-pay | Admitting: Family Medicine

## 2022-03-08 DIAGNOSIS — F419 Anxiety disorder, unspecified: Secondary | ICD-10-CM

## 2022-04-11 ENCOUNTER — Other Ambulatory Visit: Payer: Self-pay | Admitting: Family Medicine

## 2022-04-11 DIAGNOSIS — F5101 Primary insomnia: Secondary | ICD-10-CM

## 2022-06-04 ENCOUNTER — Other Ambulatory Visit: Payer: Self-pay | Admitting: Family Medicine

## 2022-06-04 DIAGNOSIS — F419 Anxiety disorder, unspecified: Secondary | ICD-10-CM

## 2022-06-06 ENCOUNTER — Other Ambulatory Visit: Payer: Self-pay | Admitting: Family Medicine

## 2022-06-06 DIAGNOSIS — F5101 Primary insomnia: Secondary | ICD-10-CM

## 2022-06-07 NOTE — Telephone Encounter (Signed)
Requested medication (s) are due for refill today - yes  Requested medication (s) are on the active medication list -yes  Future visit scheduled -no  Last refill: 04/11/22 #15  Notes to clinic: non delegated Rx  Requested Prescriptions  Pending Prescriptions Disp Refills   zolpidem (AMBIEN) 10 MG tablet [Pharmacy Med Name: Zolpidem Tartrate 10 MG Oral Tablet] 15 tablet 0    Sig: TAKE 1 TABLET BY MOUTH AT BEDTIME AS NEEDED FOR SLEEP     Not Delegated - Psychiatry:  Anxiolytics/Hypnotics Failed - 06/06/2022  9:08 PM      Failed - This refill cannot be delegated      Failed - Urine Drug Screen completed in last 360 days      Passed - Valid encounter within last 6 months    Recent Outpatient Visits           4 months ago Acute non-recurrent frontal sinusitis   [facility] Mikey Kirschner, PA-C   6 months ago Keratoacanthoma of thigh   [facility] Birdie Sons, MD   8 months ago Essential (primary) hypertension   [facility], Kirstie Peri, MD   11 months ago [place] Gwyneth Sprout, [place]   1 year ago Mixed hyperlipidemia   [facility], Vickki Muff, PA-C                 Requested Prescriptions  Pending Prescriptions Disp Refills   zolpidem (AMBIEN) 10 MG tablet [Pharmacy Med Name: Zolpidem Tartrate 10 MG Oral Tablet] 15 tablet 0    Sig: TAKE 1 TABLET BY MOUTH AT BEDTIME AS NEEDED FOR SLEEP     Not Delegated - Psychiatry:  Anxiolytics/Hypnotics Failed - 06/06/2022  9:08 PM      Failed - This refill cannot be delegated      Failed - Urine Drug Screen completed in last 360 days      Passed - Valid encounter within last 6 months    Recent Outpatient Visits           4 months ago Acute non-recurrent frontal sinusitis   [facility] Mikey Kirschner, PA-C   6 months ago Keratoacanthoma of thigh   [facility] Birdie Sons, MD   8  months ago Essential (primary) hypertension   [facility] Birdie Sons, MD   11 months ago [place] Gwyneth Sprout, [place]   1 year ago Mixed hyperlipidemia   [place], Vickki Muff, [place]

## 2022-06-09 ENCOUNTER — Other Ambulatory Visit: Payer: Self-pay | Admitting: Family Medicine

## 2022-06-22 ENCOUNTER — Ambulatory Visit (INDEPENDENT_AMBULATORY_CARE_PROVIDER_SITE_OTHER): Payer: BC Managed Care – PPO

## 2022-06-22 DIAGNOSIS — Z23 Encounter for immunization: Secondary | ICD-10-CM

## 2022-07-18 ENCOUNTER — Other Ambulatory Visit: Payer: Self-pay | Admitting: Family Medicine

## 2022-07-18 DIAGNOSIS — F419 Anxiety disorder, unspecified: Secondary | ICD-10-CM

## 2022-08-10 ENCOUNTER — Other Ambulatory Visit: Payer: Self-pay | Admitting: Family Medicine

## 2022-08-10 DIAGNOSIS — F419 Anxiety disorder, unspecified: Secondary | ICD-10-CM

## 2022-09-04 ENCOUNTER — Other Ambulatory Visit: Payer: Self-pay | Admitting: Family Medicine

## 2022-09-04 DIAGNOSIS — F5101 Primary insomnia: Secondary | ICD-10-CM

## 2022-09-05 NOTE — Telephone Encounter (Signed)
Requested medication (s) are due for refill today: yes  Requested medication (s) are on the active medication list: yes  Last refill:  06/08/22 #15/2  Future visit scheduled: no  Notes to clinic:  Unable to refill per protocol, cannot delegate.      Requested Prescriptions  Pending Prescriptions Disp Refills   zolpidem (AMBIEN) 10 MG tablet [Pharmacy Med Name: Zolpidem Tartrate 10 MG Oral Tablet] 15 tablet 0    Sig: TAKE 1 TABLET BY MOUTH AT BEDTIME AS NEEDED FOR SLEEP     Not Delegated - Psychiatry:  Anxiolytics/Hypnotics Failed - 09/04/2022  1:31 PM      Failed - This refill cannot be delegated      Failed - Urine Drug Screen completed in last 360 days      Failed - Valid encounter within last 6 months    Recent Outpatient Visits           7 months ago Acute non-recurrent frontal sinusitis   [facility] Mikey Kirschner, PA-C   9 months ago Keratoacanthoma of thigh   [facility] Birdie Sons, MD   11 months ago Essential (primary) hypertension   [place], Kirstie Peri, MD   1 year ago [place] Gwyneth Sprout, [place]   1 year ago Mixed hyperlipidemia   [place], Vickki Muff, [place]

## 2022-09-19 ENCOUNTER — Other Ambulatory Visit: Payer: Self-pay | Admitting: Family Medicine

## 2022-09-19 DIAGNOSIS — F419 Anxiety disorder, unspecified: Secondary | ICD-10-CM

## 2022-09-20 NOTE — Telephone Encounter (Signed)
Requested medication (s) are due for refill today: routing for review  Requested medication (s) are on the active medication list: yes  Last refill:  08/10/22  Future visit scheduled: yes  Notes to clinic:  Unable to refill per protocol, courtesy refill already given, routing for provider approval.      Requested Prescriptions  Pending Prescriptions Disp Refills   DULoxetine (CYMBALTA) 30 MG capsule [Pharmacy Med Name: DULoxetine HCl 30 MG Oral Capsule Delayed Release Particles] 30 capsule 0    Sig: TAKE 1 CAPSULE BY MOUTH ONCE DAILY . APPOINTMENT REQUIRED FOR FUTURE REFILLS     Psychiatry: Antidepressants - SNRI - duloxetine Failed - 09/19/2022 10:49 AM      Failed - Valid encounter within last 6 months    Recent Outpatient Visits           8 months ago Acute non-recurrent frontal sinusitis   [facility] Mikey Kirschner, PA-C   9 months ago Keratoacanthoma of thigh   [facility] Birdie Sons, [place]   11 months ago Essential (primary) hypertension   [facility] Birdie Sons, MD   1 year ago Prediabetes   [facility] Gwyneth Sprout, [place]   1 year ago Mixed hyperlipidemia   [place], Vickki Muff, PA-C       Future Appointments             In 1 month [place], Kirstie Peri, MD [facility]            Passed - Cr in normal range and within 360 days    Creat  Date Value Ref Range Status  08/09/2017 0.92 0.70 - 1.33 mg/dL Final    Comment:    For patients >59 years of age, the reference limit for Creatinine is approximately 13% higher for people identified as African-American. .    Creatinine, Ser  Date Value Ref Range Status  10/04/2021 1.07 0.76 - 1.27 mg/dL Final         Passed - eGFR is 30 or above and within 360 days    GFR, Est African American  Date Value Ref Range Status  08/09/2017 111 > OR = 60 mL/min/1.51m Final   GFR calc Af Amer  Date Value Ref  Range Status  10/06/2020 102 >59 mL/min/1.73 Final    Comment:    **In accordance with recommendations from the NKF-ASN Task force,**   [facility] is in the process of updating its eGFR calculation to the   2021 CKD-EPI creatinine equation that estimates kidney function   without a race variable.    GFR, Est Non African American  Date Value Ref Range Status  08/09/2017 96 > OR = 60 mL/min/1.725mFinal   GFR calc non Af Amer  Date Value Ref Range Status  10/06/2020 88 >59 mL/min/1.73 Final   eGFR  Date Value Ref Range Status  10/04/2021 82 >59 mL/min/1.73 Final         Passed - Completed PHQ-2 or PHQ-9 in the last 360 days      Passed - Last BP in normal range    BP Readings from Last 1 Encounters:  02/16/22 109/74

## 2022-10-05 ENCOUNTER — Other Ambulatory Visit: Payer: Self-pay | Admitting: Family Medicine

## 2022-10-06 NOTE — Telephone Encounter (Signed)
Requested Prescriptions  Pending Prescriptions Disp Refills   amLODipine (NORVASC) 10 MG tablet [Pharmacy Med Name: amLODIPine Besylate 10 MG Oral Tablet] 90 tablet 0    Sig: Take 1 tablet by mouth once daily     Cardiovascular: Calcium Channel Blockers 2 Failed - 10/05/2022  5:22 PM      Failed - Valid encounter within last 6 months    Recent Outpatient Visits           8 months ago Acute non-recurrent frontal sinusitis   [facility] Mikey Kirschner, PA-C   10 months ago Keratoacanthoma of thigh   [facility] Birdie Sons, MD   1 year ago Essential (primary) hypertension   [facility] Birdie Sons, MD   1 year ago [place] Gwyneth Sprout, [place]   1 year ago Mixed hyperlipidemia   [place], Vickki Muff, PA-C       Future Appointments             In 3 weeks Fisher, Kirstie Peri, MD [facility], [place] BP in normal range    BP Readings from Last 1 Encounters:  02/16/22 109/74         Passed - Last Heart Rate in normal range    Pulse Readings from Last 1 Encounters:  02/16/22 81

## 2022-10-17 ENCOUNTER — Other Ambulatory Visit: Payer: Self-pay | Admitting: Family Medicine

## 2022-10-17 DIAGNOSIS — F419 Anxiety disorder, unspecified: Secondary | ICD-10-CM

## 2022-10-18 NOTE — Telephone Encounter (Signed)
Requested medication (s) are due for refill today: yes  Requested medication (s) are on the active medication list: yes  Last refill:  09/20/22  Future visit scheduled: yes  Notes to clinic:  appointment required for future refills   Requested Prescriptions  Pending Prescriptions Disp Refills   DULoxetine (CYMBALTA) 30 MG capsule [Pharmacy Med Name: DULoxetine HCl 30 MG Oral Capsule Delayed Release Particles] 30 capsule 0    Sig: TAKE 1 CAPSULE BY MOUTH ONCE DAILY . APPOINTMENT REQUIRED FOR FUTURE REFILLS     Psychiatry: Antidepressants - SNRI - duloxetine Failed - 10/17/2022 10:32 AM      Failed - Cr in normal range and within 360 days    Creat  Date Value Ref Range Status  08/09/2017 0.92 0.70 - 1.33 mg/dL Final    Comment:    For patients >73 years of age, the reference limit for Creatinine is approximately 13% higher for people identified as African-American. .    Creatinine, Ser  Date Value Ref Range Status  10/04/2021 1.07 0.76 - 1.27 mg/dL Final         Failed - eGFR is 30 or above and within 360 days    GFR, Est African American  Date Value Ref Range Status  08/09/2017 111 > OR = 60 mL/min/1.29m Final   GFR calc Af Amer  Date Value Ref Range Status  10/06/2020 102 >59 mL/min/1.73 Final    Comment:    **In accordance with recommendations from the NKF-ASN Task force,**   [facility] is in the process of updating its eGFR calculation to the   2021 CKD-EPI creatinine equation that estimates kidney function   without a race variable.    GFR, Est Non African American  Date Value Ref Range Status  08/09/2017 96 > OR = 60 mL/min/1.763mFinal   GFR calc non Af Amer  Date Value Ref Range Status  10/06/2020 88 >59 mL/min/1.73 Final   eGFR  Date Value Ref Range Status  10/04/2021 82 >59 mL/min/1.73 Final         Failed - Valid encounter within last 6 months    Recent Outpatient Visits           9 months ago Acute non-recurrent frontal sinusitis   [facility] Kirsch[facility]   10 months ago Keratoacanthoma of thigh   [place]   1 year ago Essential (primary) hypertension   [place] E, MD   1 year ago Prediabetes   [facility] SproutFNP   1 year ago Mixed hyperlipidemia   [place]       Future Appointments             In 1 week Fisher, DoKirstie PeriMD [facility] [place] Completed PHQ-2 or PHQ-9 in the last 360 days      Passed - Last BP in normal range    BP Readings from Last 1 Encounters:  02/16/22 109/74

## 2022-10-31 ENCOUNTER — Encounter: Payer: BC Managed Care – PPO | Admitting: Family Medicine

## 2022-10-31 NOTE — Progress Notes (Deleted)
Argentina Ponder ,acting as a scribe for Lelon Huh, MD.,have documented all relevant documentation on the behalf of Lelon Huh, MD,as directed by  Lelon Huh, MD while in the presence of Lelon Huh, MD.    Complete physical exam   Patient: Stephen Ray   DOB: Mar 21, 1966   57 y.o. Male  MRN: [id number] Visit Date: 10/31/2022  Today's healthcare provider: Lelon Huh, MD   No chief complaint on file.  Subjective    Stephen Ray is a 57 y.o. male who presents today for a complete physical exam.  He reports consuming a {diet types:17450} diet. {Exercise:19826} He generally feels {well/fairly well/poorly:18703}. He reports sleeping {well/fairly well/poorly:18703}. He {does/does not:200015} have additional problems to discuss today.  HPI  ***  Past Medical History:  Diagnosis Date   Anxiety    Arthritis    shoulders (before replacements)   Asthma    as child   History of kidney stones    Hypercholesteremia    Hypertension    Motion sickness    deep sea fishing   Neuropathy    Shoulder pain    left and right   Umbilical hernia [id number]   Past Surgical History:  Procedure Laterality Date   CERVICAL FUSION  2001   COLONOSCOPY WITH PROPOFOL N/A 03/31/2016   Procedure: COLONOSCOPY WITH PROPOFOL;  Surgeon: Lucilla Lame, MD;  Location: [place];  Service: Endoscopy;  Laterality: N/A;   COLONOSCOPY WITH PROPOFOL N/A 02/16/2022   Procedure: COLONOSCOPY WITH PROPOFOL;  Surgeon: Toledo, [name], MD;  Location: [facility];  Service: Gastroenterology;  Laterality: N/A;   HERNIA REPAIR  [id number]   umbilical   JOINT REPLACEMENT     KNEE ARTHROSCOPY Left 12/24/2020   Procedure: ARTHROSCOPY KNEE WITH DEBRIDEMENT AND PARTIAL MENISCECTOMY;  Surgeon: Corky Mull, MD;  Location: [facility];  Service: Orthopedics;  Laterality: Left;   LIPOMA EXCISION Right 2012   under arm   LIPOMA EXCISION Right 01/03/2020   Procedure: EXCISION LIPOMA;  Surgeon: Robert Bellow,  MD;  Location: [facility];  Service: General;  Laterality: Right;   LITHOTRIPSY  2002   NERVE TRANSFER Right 07/20/2020   ulnar fascicle to triceps branch nerve transfer   POLYPECTOMY  03/31/2016   Procedure: POLYPECTOMY;  Surgeon: Lucilla Lame, MD;  Location: [place];  Service: Endoscopy;;   SHOULDER HEMI-ARTHROPLASTY Left 08/20/2015   Procedure: SHOULDER HEMI-ARTHROPLASTY;  Surgeon: Corky Mull, MD;  Location: [facility];  Service: Orthopedics;  Laterality: Left;   SHOULDER SURGERY Right 12/25/2014   shoulder hemiarthroplasty with curettage/debridement glenoid cyst   TOTAL SHOULDER ARTHROPLASTY Right 12/2014   Social History   Socioeconomic History   Marital status: Married    Spouse name: Tiffany   Number of children: Not on file   Years of education: Not on file   Highest education level: Not on file  Occupational History   Occupation: Museum/gallery curator / Architect (Self employed)  Tobacco Use   Smoking status: Former    Types: Cigarettes   Smokeless tobacco: Never  Vaping Use   Vaping Use: Never used  Substance and Sexual Activity   Alcohol use: Yes    Alcohol/week: 4.0 standard drinks of alcohol    Types: 4 Shots of liquor per week    Comment: Occasionally    Drug use: Not Currently    Types: Marijuana    Comment: last documented use 2017   Sexual activity: Not on file  Other Topics Concern   Not on  file  Social History Narrative   Not on file   Social Determinants of Health   Financial Resource Strain: Not on file  Food Insecurity: Not on file  Transportation Needs: Not on file  Physical Activity: Not on file  Stress: Not on file  Social Connections: Not on file  Intimate Partner Violence: Not on file   Family Status  Relation Name Status   Father  Deceased   Mother  Deceased   Brother  Deceased       accidentially shot himself   Neg Hx  (Not Specified)   Family History  Problem Relation Age of Onset   Nephrolithiasis Father    Cancer Father         Leukemia   Kidney cancer Neg Hx    Prostate cancer Neg Hx    Allergies  Allergen Reactions   Shellfish Allergy Anaphylaxis    Patient Care Team: Birdie Sons, MD as PCP - General (Family Medicine) Poggi, Marshall Cork, MD as Consulting Physician (Orthopedic Surgery)   Medications: Outpatient Medications Prior to Visit  Medication Sig   amLODipine (NORVASC) 10 MG tablet Take 1 tablet by mouth once daily   azelastine (ASTELIN) 0.1 % nasal spray Place 1 spray into both nostrils 2 (two) times daily. Use in each nostril as directed   DULoxetine (CYMBALTA) 30 MG capsule TAKE 1 CAPSULE BY MOUTH ONCE DAILY . APPOINTMENT REQUIRED FOR FUTURE REFILLS   gabapentin (NEURONTIN) 300 MG capsule TAKE 1 CAPSULE BY MOUTH TWICE DAILY AND 2 AT BEDTIME   lisinopril (ZESTRIL) 40 MG tablet TAKE 1 TABLET BY MOUTH ONCE DAILY . APPOINTMENT REQUIRED FOR FUTURE REFILLS   loratadine (CLARITIN) 10 MG tablet Take 1 tablet (10 mg total) by mouth daily.   meloxicam (MOBIC) 15 MG tablet Take 1 tablet (15 mg total) by mouth daily with breakfast. (Patient not taking: Reported on 02/15/2022)   methylPREDNISolone (MEDROL DOSEPAK) 4 MG TBPK tablet Take 6 pills on day 1, take 5 pills on day 2, take 4 pills on day 3, take 3 pills on day 4, take 2 pills on day 5, take 1 pill on day 6   zolpidem (AMBIEN) 10 MG tablet TAKE 1 TABLET BY MOUTH AT BEDTIME AS NEEDED FOR SLEEP   No facility-administered medications prior to visit.    Review of Systems  Constitutional: Negative.   HENT: Negative.    Eyes: Negative.   Respiratory: Negative.    Cardiovascular: Negative.   Gastrointestinal: Negative.   Endocrine: Negative.   Genitourinary: Negative.   Musculoskeletal: Negative.   Skin: Negative.   Allergic/Immunologic: Negative.   Neurological: Negative.   Hematological: Negative.   Psychiatric/Behavioral: Negative.      {Labs  Heme  Chem  Endocrine  Serology  Results Review (optional):23779}  Objective    There were no  vitals taken for this visit. {Show previous vital signs (optional):23777}   Physical Exam Constitutional:      Appearance: Normal appearance. He is normal weight.  HENT:     Head: Normocephalic and atraumatic.     Right Ear: Tympanic membrane, ear canal and external ear normal.     Left Ear: Tympanic membrane, ear canal and external ear normal.     Nose: Nose normal.     Mouth/Throat:     Mouth: Mucous membranes are moist.     Pharynx: Oropharynx is clear.  Eyes:     Extraocular Movements: Extraocular movements intact.     Conjunctiva/sclera: Conjunctivae normal.  Pupils: Pupils are equal, round, and reactive to light.  Cardiovascular:     Rate and Rhythm: Normal rate and regular rhythm.     Pulses: Normal pulses.     Heart sounds: Normal heart sounds.  Pulmonary:     Effort: Pulmonary effort is normal.     Breath sounds: Normal breath sounds.  Abdominal:     General: Abdomen is flat. Bowel sounds are normal.     Palpations: Abdomen is soft.  Musculoskeletal:        General: Normal range of motion.     Cervical back: Normal range of motion and neck supple.  Skin:    General: Skin is warm and dry.  Neurological:     General: No focal deficit present.     Mental Status: He is alert and oriented to person, place, and time. Mental status is at baseline.  Psychiatric:        Mood and Affect: Mood normal.        Behavior: Behavior normal.        Thought Content: Thought content normal.        Judgment: Judgment normal.     ***  Last depression screening scores    01/21/2022    1:11 PM 10/04/2021    8:58 AM 06/30/2021    1:23 PM  PHQ 2/9 Scores  PHQ - 2 Score 0 0 0  PHQ- 9 Score 1 2 1   $ Last fall risk screening    01/21/2022    1:11 PM  [place] in the past year? 0  Number falls in past yr: 0  Injury with Fall? 0  Risk for fall due to : No Fall Risks   Last Audit-C alcohol use screening    01/21/2022    1:11 PM  Alcohol Use Disorder Test (AUDIT)   1. How often do you have a drink containing alcohol? 1  2. How many drinks containing alcohol do you have on a typical day when you are drinking? 0  3. How often do you have six or more drinks on one occasion? 0  AUDIT-C Score 1   A score of 3 or more in women, and 4 or more in men indicates increased risk for alcohol abuse, EXCEPT if all of the points are from question 1   No results found for any visits on 10/31/22.  Assessment & Plan    Routine Health Maintenance and Physical Exam  Exercise Activities and Dietary recommendations  Goals   None     Immunization History  Administered Date(s) Administered   Influenza Split 06/19/2013   Influenza, Seasonal, Injecte, Preservative Fre 09/27/2011, 07/12/2012   Influenza,inj,Quad PF,6+ Mos 07/02/2014, 05/26/2015, 06/02/2016, 06/16/2017, 07/02/2018, 05/25/2019, 06/30/2021, 06/22/2022   Influenza-Unspecified 06/16/2017, 10/20/2018, 05/30/2019, 07/20/2020   PFIZER(Purple Top)SARS-COV-2 Vaccination 12/19/2019, 01/20/2020   Tdap 05/10/2011, 02/18/2012, 12/03/2021   Zoster Recombinat (Shingrix) 12/03/2021    Health Maintenance  Topic Date Due   Zoster Vaccines- Shingrix (2 of 2) 01/28/2022   COVID-19 Vaccine (3 - 2023-24 season) 05/20/2022   COLONOSCOPY (Pts 45-64yr Insurance coverage will need to be confirmed)  02/17/2027   DTaP/Tdap/Td (4 - Td or Tdap) 12/04/2031   INFLUENZA VACCINE  Completed   Hepatitis C Screening  Completed   HIV Screening  Completed   HPV VACCINES  Aged Out    Discussed health benefits of physical activity, and encouraged him to engage in regular exercise appropriate for his age and condition.  ***  No  follow-ups on file.     {provider attestation***:1}   Lelon Huh, MD  [place] [phone] (phone) [phone] (fax)  [place]

## 2022-11-04 ENCOUNTER — Other Ambulatory Visit: Payer: Self-pay | Admitting: Family Medicine

## 2022-11-04 DIAGNOSIS — F5101 Primary insomnia: Secondary | ICD-10-CM

## 2022-11-14 ENCOUNTER — Other Ambulatory Visit: Payer: Self-pay | Admitting: Family Medicine

## 2022-11-14 DIAGNOSIS — F419 Anxiety disorder, unspecified: Secondary | ICD-10-CM

## 2022-11-15 NOTE — Telephone Encounter (Signed)
Requested medication (s) are due for refill today: yes  Requested medication (s) are on the active medication list: yes  Last refill:  10/19/22  Future visit scheduled: no  Notes to clinic:  Unable to refill per protocol, courtesy refill already given, routing for provider approval.      Requested Prescriptions  Pending Prescriptions Disp Refills   DULoxetine (CYMBALTA) 30 MG capsule [Pharmacy Med Name: DULoxetine HCl 30 MG Oral Capsule Delayed Release Particles] 30 capsule 0    Sig: TAKE 1 CAPSULE BY MOUTH ONCE DAILY (APPOINTMENT  REQUIRED  FOR  FUTURE  REFILLS)     Psychiatry: Antidepressants - SNRI - duloxetine Failed - 11/14/2022  9:11 AM      Failed - Cr in normal range and within 360 days    Creat  Date Value Ref Range Status  08/09/2017 0.92 0.70 - 1.33 mg/dL Final    Comment:    For patients >48 years of age, the reference limit for Creatinine is approximately 13% higher for people identified as African-American. .    Creatinine, Ser  Date Value Ref Range Status  10/04/2021 1.07 0.76 - 1.27 mg/dL Final         Failed - eGFR is 30 or above and within 360 days    GFR, Est African American  Date Value Ref Range Status  08/09/2017 111 > OR = 60 mL/min/1.35m Final   GFR calc Af Amer  Date Value Ref Range Status  10/06/2020 102 >59 mL/min/1.73 Final    Comment:    **In accordance with recommendations from the NKF-ASN Task force,**   [facility] is in the process of updating its eGFR calculation to the   2021 CKD-EPI creatinine equation that estimates kidney function   without a race variable.    GFR, Est Non African American  Date Value Ref Range Status  08/09/2017 96 > OR = 60 mL/min/1.747mFinal   GFR calc non Af Amer  Date Value Ref Range Status  10/06/2020 88 >59 mL/min/1.73 Final   eGFR  Date Value Ref Range Status  10/04/2021 82 >59 mL/min/1.73 Final         Failed - Valid encounter within last 6 months    Recent Outpatient Visits           9  months ago Acute non-recurrent frontal sinusitis   [facility] KirschnerPA-C   11 months ago Keratoacanthoma of thigh   [place] E, [place]   1 year ago Essential (primary) hypertension   [place] E, [place]   1 year ago Prediabetes   [facility]wyneth SproutFNP   1 year ago Mixed hyperlipidemia   Co[place]daleDeVickki MuffPA-C       Future Appointments             In 1 month Fisher, DoKirstie PeriMD [place] Completed PHQ-2 or PHQ-9 in the last 360 days      Passed - Last BP in normal range    BP Readings from Last 1 Encounters:  02/16/22 109/74

## 2022-11-21 ENCOUNTER — Other Ambulatory Visit: Payer: Self-pay | Admitting: Family Medicine

## 2022-11-21 DIAGNOSIS — F419 Anxiety disorder, unspecified: Secondary | ICD-10-CM

## 2022-11-22 ENCOUNTER — Telehealth (INDEPENDENT_AMBULATORY_CARE_PROVIDER_SITE_OTHER): Payer: BC Managed Care – PPO | Admitting: Physician Assistant

## 2022-11-22 ENCOUNTER — Encounter: Payer: Self-pay | Admitting: Physician Assistant

## 2022-11-22 VITALS — BP 128/74 | Ht 70.0 in | Wt 203.0 lb

## 2022-11-22 DIAGNOSIS — F419 Anxiety disorder, unspecified: Secondary | ICD-10-CM | POA: Diagnosis not present

## 2022-11-22 DIAGNOSIS — Z91013 Allergy to seafood: Secondary | ICD-10-CM | POA: Diagnosis not present

## 2022-11-22 MED ORDER — EPINEPHRINE 0.3 MG/0.3ML IJ SOAJ: 0.3000 mg | INTRAMUSCULAR | 0 refills | Status: DC | PRN

## 2022-11-22 MED ORDER — DULOXETINE HCL 30 MG PO CPEP: 30.0000 mg | ORAL_CAPSULE | Freq: Every day | ORAL | 1 refills | Status: DC

## 2022-11-22 NOTE — Progress Notes (Signed)
I,Stephen Ray,acting as a Education administrator for [employer], Stephen Ray.,have documented all relevant documentation on the behalf of Stephen Kirschner, Stephen Ray,as directed by  Stephen Kirschner, Stephen Ray while in the presence of Stephen Kirschner, Stephen Ray.  MyChart Audio Visit    Virtual Visit via Audio Note   This format is felt to be most appropriate for this patient at this time. Physical exam was limited by quality of the video and audio technology used for the visit. He preferred to keep his video off.  Patient location:  Provider location: [facility]  I discussed the limitations of evaluation and management by telemedicine and the availability of in person appointments. The patient expressed understanding and agreed to proceed.  Patient: Stephen Ray   DOB: [birth year]   57 y.o. Male  MRN: [id number] Visit Date: 11/22/2022  Today's healthcare provider: Mikey Kirschner, Stephen Ray   Cc. Anxiety f/u   Subjective    HPI  Anxiety, Follow-up  He was last seen for anxiety 13 months ago. Changes made at last visit include started Cymbalta, can take qod for 1 month and then d/c if doing well.  He feels his anxiety is mild and Improved since last visit.  Symptoms: No chest pain No difficulty concentrating  No dizziness No fatigue  No feelings of losing control Yes insomnia   Yes irritable No palpitations  No panic attacks No racing thoughts  No shortness of breath No sweating  No tremors/shakes    GAD-7 Results    11/22/2022    1:41 PM 02/25/2020    3:48 PM  GAD-7 Generalized Anxiety Disorder Screening Tool  1. Feeling Nervous, Anxious, or on Edge 0 0  2. Not Being Able to Stop or Control Worrying 0 0  3. Worrying Too Much About Different Things 0 0  4. Trouble Relaxing 2 0  5. Being So Restless it's Hard To Sit Still 0 0  6. Becoming Easily Annoyed or Irritable 1 0  7. Feeling Afraid As If Something Awful Might Happen 0 0  Total GAD-7 Score 3 0  Difficulty At Work, Home, or Getting   Along With Others? Not difficult at all Not difficult at all    PHQ-9 Scores    11/22/2022    1:43 PM 01/21/2022    1:11 PM 10/04/2021    8:58 AM  PHQ9 SCORE ONLY  PHQ-9 Total Score '2 1 2    '$ ---------------------------------------------------------------------------------------------------  Pt has an allergy to shellfish, requests an epipen  Medications: Outpatient Medications Prior to Visit  Medication Sig   amLODipine (NORVASC) 10 MG tablet Take 1 tablet by mouth once daily   gabapentin (NEURONTIN) 300 MG capsule TAKE 1 CAPSULE BY MOUTH TWICE DAILY AND 2 AT BEDTIME   lisinopril (ZESTRIL) 40 MG tablet TAKE 1 TABLET BY MOUTH ONCE DAILY . APPOINTMENT REQUIRED FOR FUTURE REFILLS   loratadine (CLARITIN) 10 MG tablet Take 1 tablet (10 mg total) by mouth daily.   zolpidem (AMBIEN) 10 MG tablet TAKE 1 TABLET BY MOUTH AT BEDTIME AS NEEDED FOR SLEEP   [DISCONTINUED] DULoxetine (CYMBALTA) 30 MG capsule TAKE 1 CAPSULE BY MOUTH ONCE DAILY . APPOINTMENT REQUIRED FOR FUTURE REFILLS   azelastine (ASTELIN) 0.1 % nasal spray Place 1 spray into both nostrils 2 (two) times daily. Use in each nostril as directed (Patient not taking: Reported on 11/22/2022)   meloxicam (MOBIC) 15 MG tablet Take 1 tablet (15 mg total) by mouth daily with breakfast. (Patient not taking: Reported on 02/15/2022)   methylPREDNISolone (MEDROL  DOSEPAK) 4 MG TBPK tablet Take 6 pills on day 1, take 5 pills on day 2, take 4 pills on day 3, take 3 pills on day 4, take 2 pills on day 5, take 1 pill on day 6 (Patient not taking: Reported on 11/22/2022)   No facility-administered medications prior to visit.    Review of Systems  Constitutional:  Negative for fatigue and fever.  Respiratory:  Negative for cough and shortness of breath.   Cardiovascular:  Negative for chest pain, palpitations and leg swelling.  Neurological:  Negative for dizziness and headaches.      Objective    BP 128/74 (BP Location: Left Arm) Comment: home value   Ht '5\' 10"'$  (1.778 m)   Wt 203 lb (92.1 kg)   BMI 29.13 kg/m   Physical Exam Neurological:     Mental Status: He is oriented to person, place, and time.  Psychiatric:        Mood and Affect: Mood normal.        Behavior: Behavior normal.        Assessment & Plan     Problem List Items Addressed This Visit       Other   Anxiety - Primary    Stable on cymbalta 30 mg , refilled      Relevant Medications   DULoxetine (CYMBALTA) 30 MG capsule   Other Visit Diagnoses     Seafood allergy       Relevant Medications   EPINEPHrine (EPIPEN 2-PAK) 0.3 mg/0.3 mL IJ SOAJ injection       Return in about 3 months (around 02/22/2023) for CPE w/ pcp.     I discussed the assessment and treatment plan with the patient. The patient was provided an opportunity to ask questions and all were answered. The patient agreed with the plan and demonstrated an understanding of the instructions.   The patient was advised to call back or seek an in-person evaluation if the symptoms worsen or if the condition fails to improve as anticipated.  I provided 7 minutes of non-face-to-face time during this encounter.  I, Stephen Kirschner, Stephen Ray have reviewed all documentation for this visit. The documentation on  11/22/22  for the exam, diagnosis, procedures, and orders are all accurate and complete.  Stephen Kirschner, Stephen Ray [facility] [address] [place], [place], [address] Office: [phone] Fax: [place]

## 2022-11-22 NOTE — Telephone Encounter (Signed)
Patient is calling in for an update on his medication. Patient says he is completely out of medication. Please advise.

## 2022-11-22 NOTE — Assessment & Plan Note (Signed)
Stable on cymbalta 30 mg , refilled

## 2022-11-30 ENCOUNTER — Other Ambulatory Visit: Payer: Self-pay | Admitting: Surgery

## 2022-11-30 DIAGNOSIS — M1712 Unilateral primary osteoarthritis, left knee: Secondary | ICD-10-CM

## 2022-11-30 DIAGNOSIS — S83232S Complex tear of medial meniscus, current injury, left knee, sequela: Secondary | ICD-10-CM

## 2022-12-06 ENCOUNTER — Ambulatory Visit
Admission: RE | Admit: 2022-12-06 | Discharge: 2022-12-06 | Disposition: A | Discharge status: Home / Self Care | Payer: BC Managed Care – PPO | Source: Ambulatory Visit | Attending: Surgery | Admitting: Surgery

## 2022-12-06 DIAGNOSIS — S83232S Complex tear of medial meniscus, current injury, left knee, sequela: Secondary | ICD-10-CM | POA: Insufficient documentation

## 2022-12-06 DIAGNOSIS — M1712 Unilateral primary osteoarthritis, left knee: Secondary | ICD-10-CM | POA: Diagnosis present

## 2022-12-14 ENCOUNTER — Other Ambulatory Visit: Payer: Self-pay | Admitting: Surgery

## 2022-12-15 ENCOUNTER — Other Ambulatory Visit: Payer: Self-pay | Admitting: Family Medicine

## 2022-12-19 ENCOUNTER — Other Ambulatory Visit: Payer: Self-pay

## 2022-12-19 ENCOUNTER — Other Ambulatory Visit: Payer: Self-pay | Admitting: Family Medicine

## 2022-12-19 ENCOUNTER — Encounter
Admission: RE | Admit: 2022-12-19 | Discharge: 2022-12-19 | Disposition: A | Discharge status: Home / Self Care | Payer: BC Managed Care – PPO | Source: Ambulatory Visit | Attending: Surgery | Admitting: Surgery

## 2022-12-19 DIAGNOSIS — Z01812 Encounter for preprocedural laboratory examination: Secondary | ICD-10-CM

## 2022-12-19 DIAGNOSIS — F5101 Primary insomnia: Secondary | ICD-10-CM

## 2022-12-19 DIAGNOSIS — Z01818 Encounter for other preprocedural examination: Secondary | ICD-10-CM | POA: Diagnosis not present

## 2022-12-19 DIAGNOSIS — E876 Hypokalemia: Secondary | ICD-10-CM

## 2022-12-19 LAB — COMPREHENSIVE METABOLIC PANEL
ALT: 34 U/L (ref 0–44)
AST: 28 U/L (ref 15–41)
Albumin: 4.1 g/dL (ref 3.5–5.0)
Alkaline Phosphatase: 66 U/L (ref 38–126)
Anion gap: 10 (ref 5–15)
BUN: 13 mg/dL (ref 6–20)
CO2: 25 mmol/L (ref 22–32)
Calcium: 9 mg/dL (ref 8.9–10.3)
Chloride: 104 mmol/L (ref 98–111)
Creatinine, Ser: 0.79 mg/dL (ref 0.61–1.24)
GFR, Estimated: >60 mL/min (ref >60)
Glucose, Bld: 137 mg/dL — ABNORMAL HIGH (ref 70–99)
Potassium: 3 mmol/L — ABNORMAL LOW (ref 3.5–5.1)
Sodium: 139 mmol/L (ref 135–145)
Total Bilirubin: 0.7 mg/dL (ref 0.3–1.2)
Total Protein: 6.8 g/dL (ref 6.5–8.1)

## 2022-12-19 LAB — TYPE AND SCREEN
ABO/RH(D): O POS
Antibody Screen: NEGATIVE

## 2022-12-19 LAB — CBC WITH DIFFERENTIAL/PLATELET
Abs Immature Granulocytes: 0.02 10*3/uL (ref 0.00–0.07)
Basophils Absolute: 0.1 10*3/uL (ref 0.0–0.1)
Basophils Relative: 1 %
Eosinophils Absolute: 0.1 10*3/uL (ref 0.0–0.5)
Eosinophils Relative: 2 %
HCT: 39.4 % (ref 39.0–52.0)
Hemoglobin: 13.5 g/dL (ref 13.0–17.0)
Immature Granulocytes: 0 %
Lymphocytes Relative: 42 %
Lymphs Abs: 2.5 10*3/uL (ref 0.7–4.0)
MCH: 31.8 pg (ref 26.0–34.0)
MCHC: 34.3 g/dL (ref 30.0–36.0)
MCV: 92.7 fL (ref 80.0–100.0)
Monocytes Absolute: 0.4 10*3/uL (ref 0.1–1.0)
Monocytes Relative: 7 %
Neutro Abs: 2.9 10*3/uL (ref 1.7–7.7)
Neutrophils Relative %: 48 %
Platelets: 245 10*3/uL (ref 150–400)
RBC: 4.25 MIL/uL (ref 4.22–5.81)
RDW: 12.9 % (ref 11.5–15.5)
WBC: 6 10*3/uL (ref 4.0–10.5)
nRBC: 0 % (ref 0.0–0.2)

## 2022-12-19 LAB — URINALYSIS, ROUTINE W REFLEX MICROSCOPIC
Bilirubin Urine: NEGATIVE
Glucose, UA: NEGATIVE mg/dL
Hgb urine dipstick: NEGATIVE
Ketones, ur: NEGATIVE mg/dL
Leukocytes,Ua: NEGATIVE
Nitrite: NEGATIVE
Protein, ur: NEGATIVE mg/dL
Specific Gravity, Urine: 1.018 (ref 1.005–1.030)
pH: 5 (ref 5.0–8.0)

## 2022-12-19 LAB — SURGICAL PCR SCREEN
MRSA, PCR: NEGATIVE
Staphylococcus aureus: NEGATIVE

## 2022-12-19 MED ORDER — POTASSIUM CHLORIDE CRYS ER 20 MEQ PO TBCR: EXTENDED_RELEASE_TABLET | ORAL | 0 refills | Status: DC

## 2022-12-19 NOTE — Progress Notes (Signed)
  [facility] Perioperative Services: Pre-Admission/Anesthesia Testing  Abnormal Lab Notification and Treatment Plan of Care   Date: 12/19/22  Name: Stephen Ray MRN:   [id number]  Re: Abnormal labs noted during PAT appointment   Notified:  Provider Name Provider Role Notification Mode  Poggi, Jenny Reichmann, MD Orthopedics (Surgeon) Routed and/or faxed via Alvira Philips, MD Primary Care Provider Routed and/or faxed via [place] and Notes:  ABNORMAL LAB VALUE(S): Lab Results  Component Value Date   K 3.0 (L) 12/19/2022   [name] is scheduled for an elective on 12/26/2022. In review of his medication reconciliation, it is noted that the patient is not taking any typoe of prescribed diuretic medications.   Please note, in efforts to promote a safe and effective anesthetic course, per current guidelines/standards set by the [facility] anesthesia team, the minimal acceptable K+ level for the patient to proceed with general anesthesia is 3.0 mmol/L. With that being said, if the patient drops any lower, his elective procedure will need to be postponed until K+ is better optimized. In efforts to prevent case cancellation, will make efforts to optimize pre-surgical K+ level so that patient can safely undergo the planned surgical intervention.   Impression and Plan:  Stephen Ray found to be HYPOkalemic at 3.0 mmol/L on preoperative labs. He is not on diuretic therapy or oral K+ supplementation at this time. Contacted patient to discuss results and plans for correction of noted electrolyte derangement as follows:  Meds ordered this encounter  Medications   potassium chloride SA (KLOR-CON M) 20 MEQ tablet    Sig: Take 2 tablets (40 mEq) on 12/19/2021, then 1 tablet (20 mEq) daily until surgery. Take dose on day of surgery. Follow up with PCP for repeat labs.    Dispense:  8 tablet    Refill:  0   Encouraged patient to follow up with PCP about 2-3  weeks postoperatively to have labs rechecked to ensure that levels are remaining within normal range. Discussed nutritional intake of K+ rich foods as an adjunctive way to keep his K+ levels normal; list of K+ rich foods verbally provided. Also mentioned ORS, however advised him not to rely solely on these drinks, as they are high in Na+, and he has a HTN diagnosis.   Will send copy of this note to surgeon and PCP to make them aware of K+ level and plans for correction. Order entered to recheck K+ on the day of his surgery to ensure optimization. Wished patient the best of luck with his upcoming surgery and subsequent recovery. He was encouraged to return call to the PAT clinic, or to his surgeon's office, should any questions or concerns arise between now and the time of his surgery.   Encounter Diagnoses  Name Primary?   Pre-operative laboratory examination Yes   Hypokalemia    Honor Loh, MSN, APRN, FNP-C, CEN [facility]  Peri-operative Services Nurse Practitioner Phone: [phone] 12/19/22 3:12 PM  NOTE: This note has been prepared using Dragon dictation software. Despite my best ability to proofread, there is always the potential that unintentional transcriptional errors may still occur from this process.

## 2022-12-19 NOTE — Patient Instructions (Addendum)
Your procedure is scheduled on: Tuesday 12/27/22 To find out your arrival time, please call [phone] between [place] on:   Monday 12/26/22 Report to the Registration Desk on the 1st floor of the [place]. Valet parking is available.  If your arrival time is 6:00 am, do not arrive before that time as the [place] entrance doors do not open until 6:00 am.  REMEMBER: Instructions that are not followed completely may result in serious medical risk, up to and including death; or upon the discretion of your surgeon and anesthesiologist your surgery may need to be rescheduled.  Do not eat food after midnight the night before surgery.  No gum chewing or hard candies.  You may however, drink CLEAR liquids up to 2 hours before you are scheduled to arrive for your surgery. Do not drink anything within 2 hours of your scheduled arrival time.  Clear liquids include: - water  - apple juice without pulp - gatorade (not RED colors) - black coffee or tea (Do NOT add milk or creamers to the coffee or tea) Do NOT drink anything that is not on this list.  Type 1 and Type 2 diabetics should only drink water.  In addition, your doctor has ordered for you to drink the provided:  Ensure Pre-Surgery Clear Carbohydrate Drink  Drinking this carbohydrate drink up to two hours before surgery helps to reduce insulin resistance and improve patient outcomes. Please complete drinking 2 hours before scheduled arrival time.  One week prior to surgery: Stop Anti-inflammatories (NSAIDS) such as Advil, Aleve, Ibuprofen, Motrin, Naproxen, Naprosyn and Aspirin based products such as Excedrin, Goody's Powder, BC Powder. You may however, continue to take Tylenol if needed for pain up until the day of surgery.  Stop ANY OVER THE COUNTER supplements until after surgery.  Continue taking all prescribed medications.   TAKE ONLY THESE MEDICATIONS THE MORNING OF SURGERY WITH A SIP OF WATER:  amLODipine (NORVASC)  10 MG tablet  DULoxetine (CYMBALTA) 30 MG capsule  gabapentin (NEURONTIN) 300 MG capsule  loratadine (CLARITIN) 10 MG tablet   No Alcohol for 24 hours before or after surgery.  No Smoking including e-cigarettes for 24 hours before surgery.  No chewable tobacco products for at least 6 hours before surgery.  No nicotine patches on the day of surgery.  Do not use any "recreational" drugs for at least a week (preferably 2 weeks) before your surgery.  Please be advised that the combination of cocaine and anesthesia may have negative outcomes, up to and including death. If you test positive for cocaine, your surgery will be cancelled.  On the morning of surgery brush your teeth with toothpaste and water, you may rinse your mouth with mouthwash if you wish. Do not swallow any toothpaste or mouthwash.  Use CHG Soap or wipes as directed on instruction sheet.  Do not wear lotions, powders, or perfumes.   Do not shave body hair from the neck down 48 hours before surgery.  Wear comfortable clothing (specific to your surgery type) to the hospital.  Do not wear jewelry, make-up, hairpins, clips or nail polish.  Contact lenses, hearing aids and dentures may not be worn into surgery.  Do not bring valuables to the hospital. [facility] is not responsible for any missing/lost belongings or valuables.   Notify your doctor if there is any change in your medical condition (cold, fever, infection).  If you are being discharged the day of surgery, you will not be allowed to  drive home. You will need a responsible individual to drive you home and stay with you for 24 hours after surgery.   If you are taking public transportation, you will need to have a responsible individual with you.  If you are being admitted to the hospital overnight, leave your suitcase in the car. After surgery it may be brought to your room.  In case of increased patient census, it may be necessary for you, the patient, to  continue your postoperative care in the Same Day Surgery department.  After surgery, you can help prevent lung complications by doing breathing exercises.  Take deep breaths and cough every 1-2 hours. Your doctor may order a device called an Incentive Spirometer to help you take deep breaths. When coughing or sneezing, hold a pillow firmly against your incision with both hands. This is called "splinting." Doing this helps protect your incision. It also decreases belly discomfort.  Surgery Visitation Policy:  Patients undergoing a surgery or procedure may have two family members or support persons with them as long as the person is not COVID-19 positive or experiencing its symptoms.   Inpatient Visitation:    Visiting hours are 7 a.m. to 8 p.m. Up to four visitors are allowed at one time in a patient room. The visitors may rotate out with other people during the day. One designated support person (adult) may remain overnight.  Please call the [facility]. at [phone] if you have any questions about these instructions.  How to Use an Incentive Spirometer  An incentive spirometer is a tool that measures how well you are filling your lungs with each breath. Learning to take long, deep breaths using this tool can help you keep your lungs clear and active. This may help to reverse or lessen your chance of developing breathing (pulmonary) problems, especially infection. You may be asked to use a spirometer: After a surgery. If you have a lung problem or a history of smoking. After a long period of time when you have been unable to move or be active. If the spirometer includes an indicator to show the highest number that you have reached, your health care provider or respiratory therapist will help you set a goal. Keep a log of your progress as told by your health care provider. What are the risks? Breathing too quickly may cause dizziness or cause you to pass out. Take your  time so you do not get dizzy or light-headed. If you are in pain, you may need to take pain medicine before doing incentive spirometry. It is harder to take a deep breath if you are having pain. How to use your incentive spirometer  Sit up on the edge of your bed or on a chair. Hold the incentive spirometer so that it is in an upright position. Before you use the spirometer, breathe out normally. Place the mouthpiece in your mouth. Make sure your lips are closed tightly around it. Breathe in slowly and as deeply as you can through your mouth, causing the piston or the ball to rise toward the top of the chamber. Hold your breath for 3-5 seconds, or for as long as possible. If the spirometer includes a coach indicator, use this to guide you in breathing. Slow down your breathing if the indicator goes above the marked areas. Remove the mouthpiece from your mouth and breathe out normally. The piston or ball will return to the bottom of the chamber. Rest for a few seconds, then repeat  the steps 10 or more times. Take your time and take a few normal breaths between deep breaths so that you do not get dizzy or light-headed. Do this every 1-2 hours when you are awake. If the spirometer includes a goal marker to show the highest number you have reached (best effort), use this as a goal to work toward during each repetition. After each set of 10 deep breaths, cough a few times. This will help to make sure that your lungs are clear. If you have an incision on your chest or abdomen from surgery, place a pillow or a rolled-up towel firmly against the incision when you cough. This can help to reduce pain while taking deep breaths and coughing. General tips When you are able to get out of bed: Walk around often. Continue to take deep breaths and cough in order to clear your lungs. Keep using the incentive spirometer until your health care provider says it is okay to stop using it. If you have been in the  hospital, you may be told to keep using the spirometer at home. Contact a health care provider if: You are having difficulty using the spirometer. You have trouble using the spirometer as often as instructed. Your pain medicine is not giving enough relief for you to use the spirometer as told. You have a fever. Get help right away if: You develop shortness of breath. You develop a cough with bloody mucus from the lungs. You have fluid or blood coming from an incision site after you cough. Summary An incentive spirometer is a tool that can help you learn to take long, deep breaths to keep your lungs clear and active. You may be asked to use a spirometer after a surgery, if you have a lung problem or a history of smoking, or if you have been inactive for a long period of time. Use your incentive spirometer as instructed every 1-2 hours while you are awake. If you have an incision on your chest or abdomen, place a pillow or a rolled-up towel firmly against your incision when you cough. This will help to reduce pain. Get help right away if you have shortness of breath, you cough up bloody mucus, or blood comes from your incision when you cough. This information is not intended to replace advice given to you by your health care provider. Make sure you discuss any questions you have with your health care provider. Document Revised: 11/25/2019 Document Reviewed: 11/25/2019 Elsevier Patient Education  [place].    Use CHG soap for 5 days.  Ensure or G2 drink 2 hours prior to surgery.  Preoperative Educational Videos for Total Hip, Knee and Shoulder Replacements  To better prepare for surgery, please view our videos that explain the physical activity and discharge planning required to have the best surgical recovery at [facility].  [email or web address]  Questions? Call [phone] or email [email or web address]

## 2022-12-20 NOTE — Telephone Encounter (Signed)
Requested medications are due for refill today.  Provider to determine  Requested medications are on the active medications list.  yes  Last refill. 11/04/2022 #15 1 rf  Future visit scheduled.   yes  Notes to clinic.  Refill not delegated.    Requested Prescriptions  Pending Prescriptions Disp Refills   zolpidem (AMBIEN) 10 MG tablet [Pharmacy Med Name: Zolpidem Tartrate 10 MG Oral Tablet] 15 tablet 0    Sig: TAKE 1 TABLET BY MOUTH AT BEDTIME AS NEEDED FOR SLEEP     Not Delegated - Psychiatry:  Anxiolytics/Hypnotics Failed - 12/19/2022 10:57 AM      Failed - This refill cannot be delegated      Failed - Urine Drug Screen completed in last 360 days      Passed - Valid encounter within last 6 months    Recent Outpatient Visits           4 weeks ago [place] Mikey Kirschner, PA-C   11 months ago Acute non-recurrent frontal sinusitis   [place] Mikey Kirschner, PA-C   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

## 2022-12-26 MED ORDER — LACTATED RINGERS IV SOLN: INTRAVENOUS | Status: DC

## 2022-12-26 MED ORDER — FAMOTIDINE 20 MG PO TABS
20.0000 mg | ORAL_TABLET | Freq: Once | ORAL | Status: AC
  Administered 2022-12-27: 20 mg via ORAL

## 2022-12-26 MED ORDER — CHLORHEXIDINE GLUCONATE 0.12 % MT SOLN
15.0000 mL | Freq: Once | OROMUCOSAL | Status: AC
  Administered 2022-12-27: 15 mL via OROMUCOSAL

## 2022-12-26 MED ORDER — ORAL CARE MOUTH RINSE: 15.0000 mL | Freq: Once | OROMUCOSAL | Status: AC

## 2022-12-26 MED ORDER — CEFAZOLIN SODIUM-DEXTROSE 2-4 GM/100ML-% IV SOLN
2.0000 g | INTRAVENOUS | Status: AC
  Administered 2022-12-27: 2 mg via INTRAVENOUS

## 2022-12-27 ENCOUNTER — Other Ambulatory Visit: Payer: Self-pay

## 2022-12-27 ENCOUNTER — Ambulatory Visit
Admission: RE | Admit: 2022-12-27 | Discharge: 2022-12-27 | Disposition: A | Discharge status: Home / Self Care | Payer: BC Managed Care – PPO | Source: Ambulatory Visit | Attending: Surgery | Admitting: Surgery

## 2022-12-27 ENCOUNTER — Ambulatory Visit: Payer: BC Managed Care – PPO | Admitting: Urgent Care

## 2022-12-27 ENCOUNTER — Encounter
Admission: RE | Disposition: A | Discharge status: Home / Self Care | Payer: Self-pay | Source: Ambulatory Visit | Attending: Surgery

## 2022-12-27 ENCOUNTER — Encounter: Payer: Self-pay | Admitting: Surgery

## 2022-12-27 ENCOUNTER — Ambulatory Visit: Payer: BC Managed Care – PPO

## 2022-12-27 DIAGNOSIS — J0110 Acute frontal sinusitis, unspecified: Secondary | ICD-10-CM

## 2022-12-27 DIAGNOSIS — M1712 Unilateral primary osteoarthritis, left knee: Secondary | ICD-10-CM | POA: Diagnosis present

## 2022-12-27 DIAGNOSIS — E876 Hypokalemia: Secondary | ICD-10-CM

## 2022-12-27 DIAGNOSIS — Z01812 Encounter for preprocedural laboratory examination: Secondary | ICD-10-CM

## 2022-12-27 DIAGNOSIS — Z79899 Other long term (current) drug therapy: Secondary | ICD-10-CM | POA: Diagnosis not present

## 2022-12-27 DIAGNOSIS — F419 Anxiety disorder, unspecified: Secondary | ICD-10-CM | POA: Insufficient documentation

## 2022-12-27 DIAGNOSIS — I10 Essential (primary) hypertension: Secondary | ICD-10-CM | POA: Diagnosis not present

## 2022-12-27 DIAGNOSIS — M25762 Osteophyte, left knee: Secondary | ICD-10-CM | POA: Diagnosis not present

## 2022-12-27 DIAGNOSIS — Z01818 Encounter for other preprocedural examination: Secondary | ICD-10-CM

## 2022-12-27 HISTORY — PX: PARTIAL KNEE ARTHROPLASTY: SHX2174

## 2022-12-27 LAB — POCT I-STAT, CHEM 8
BUN: 18 mg/dL (ref 6–20)
Calcium, Ion: 1.23 mmol/L (ref 1.15–1.40)
Chloride: 101 mmol/L (ref 98–111)
Creatinine, Ser: 0.9 mg/dL (ref 0.61–1.24)
Glucose, Bld: 167 mg/dL — ABNORMAL HIGH (ref 70–99)
HCT: 39 % (ref 39.0–52.0)
Hemoglobin: 13.3 g/dL (ref 13.0–17.0)
Potassium: 3 mmol/L — ABNORMAL LOW (ref 3.5–5.1)
Sodium: 138 mmol/L (ref 135–145)
TCO2: 26 mmol/L (ref 22–32)

## 2022-12-27 SURGERY — ARTHROPLASTY, KNEE, UNICOMPARTMENTAL
Anesthesia: Spinal | Site: Knee | Laterality: Left

## 2022-12-27 MED ORDER — ONDANSETRON HCL 4 MG/2ML IJ SOLN: 4.0000 mg | Freq: Once | INTRAMUSCULAR | Status: DC | PRN

## 2022-12-27 MED ORDER — CEFAZOLIN SODIUM-DEXTROSE 2-4 GM/100ML-% IV SOLN
INTRAVENOUS | Status: AC
  Filled 2022-12-27: qty 100

## 2022-12-27 MED ORDER — CEFAZOLIN SODIUM-DEXTROSE 2-4 GM/100ML-% IV SOLN
2.0000 g | Freq: Four times a day (QID) | INTRAVENOUS | Status: DC
  Administered 2022-12-27: 2 g via INTRAVENOUS

## 2022-12-27 MED ORDER — OXYCODONE HCL 5 MG PO TABS
ORAL_TABLET | ORAL | Status: AC
  Filled 2022-12-27: qty 1

## 2022-12-27 MED ORDER — TRIAMCINOLONE ACETONIDE 40 MG/ML IJ SUSP
INTRAMUSCULAR | Status: AC
  Filled 2022-12-27: qty 2

## 2022-12-27 MED ORDER — FAMOTIDINE 20 MG PO TABS
ORAL_TABLET | ORAL | Status: AC
  Filled 2022-12-27: qty 1

## 2022-12-27 MED ORDER — FENTANYL CITRATE (PF) 100 MCG/2ML IJ SOLN
INTRAMUSCULAR | Status: AC
  Filled 2022-12-27: qty 2

## 2022-12-27 MED ORDER — EPINEPHRINE PF 1 MG/ML IJ SOLN
INTRAMUSCULAR | Status: AC
  Filled 2022-12-27: qty 1

## 2022-12-27 MED ORDER — SODIUM CHLORIDE 0.9 % BOLUS PEDS
250.0000 mL | Freq: Once | INTRAVENOUS | Status: AC
  Administered 2022-12-27: 250 mL via INTRAVENOUS

## 2022-12-27 MED ORDER — ONDANSETRON HCL 4 MG/2ML IJ SOLN: 4.0000 mg | Freq: Four times a day (QID) | INTRAMUSCULAR | Status: DC | PRN

## 2022-12-27 MED ORDER — ACETAMINOPHEN 325 MG PO TABS: 325.0000 mg | ORAL_TABLET | Freq: Four times a day (QID) | ORAL | Status: DC | PRN

## 2022-12-27 MED ORDER — OXYCODONE HCL 5 MG PO TABS
5.0000 mg | ORAL_TABLET | ORAL | Status: DC | PRN
  Administered 2022-12-27: 5 mg via ORAL

## 2022-12-27 MED ORDER — OXYCODONE HCL 5 MG PO TABS
5.0000 mg | ORAL_TABLET | Freq: Once | ORAL | Status: AC | PRN
  Administered 2022-12-27: 5 mg via ORAL

## 2022-12-27 MED ORDER — TRANEXAMIC ACID 1000 MG/10ML IV SOLN
INTRAVENOUS | Status: AC
  Filled 2022-12-27: qty 10

## 2022-12-27 MED ORDER — METOCLOPRAMIDE HCL 10 MG PO TABS: 5.0000 mg | ORAL_TABLET | Freq: Three times a day (TID) | ORAL | Status: DC | PRN

## 2022-12-27 MED ORDER — FENTANYL CITRATE (PF) 100 MCG/2ML IJ SOLN
25.0000 ug | INTRAMUSCULAR | Status: DC | PRN
  Administered 2022-12-27 (×3): 50 ug via INTRAVENOUS

## 2022-12-27 MED ORDER — CHLORHEXIDINE GLUCONATE 0.12 % MT SOLN
OROMUCOSAL | Status: AC
  Filled 2022-12-27: qty 15

## 2022-12-27 MED ORDER — BUPIVACAINE LIPOSOME 1.3 % IJ SUSP
INTRAMUSCULAR | Status: AC
  Filled 2022-12-27: qty 20

## 2022-12-27 MED ORDER — KETOROLAC TROMETHAMINE 30 MG/ML IJ SOLN
30.0000 mg | Freq: Once | INTRAMUSCULAR | Status: AC
  Administered 2022-12-27: 30 mg via INTRAVENOUS

## 2022-12-27 MED ORDER — PROPOFOL 10 MG/ML IV BOLUS
INTRAVENOUS | Status: DC | PRN
  Administered 2022-12-27: 50 mg via INTRAVENOUS

## 2022-12-27 MED ORDER — PROPOFOL 10 MG/ML IV BOLUS
INTRAVENOUS | Status: AC
  Filled 2022-12-27: qty 20

## 2022-12-27 MED ORDER — OXYCODONE HCL 5 MG PO TABS: 5.0000 mg | ORAL_TABLET | ORAL | 0 refills | Status: DC | PRN

## 2022-12-27 MED ORDER — BUPIVACAINE HCL (PF) 0.5 % IJ SOLN
INTRAMUSCULAR | Status: AC
  Filled 2022-12-27: qty 30

## 2022-12-27 MED ORDER — MIDAZOLAM HCL 5 MG/5ML IJ SOLN
INTRAMUSCULAR | Status: DC | PRN
  Administered 2022-12-27: 2 mg via INTRAVENOUS

## 2022-12-27 MED ORDER — KETOROLAC TROMETHAMINE 30 MG/ML IJ SOLN
INTRAMUSCULAR | Status: AC
  Filled 2022-12-27: qty 1

## 2022-12-27 MED ORDER — SODIUM CHLORIDE 0.9 % IR SOLN
Status: DC | PRN
  Administered 2022-12-27: 3000 mL

## 2022-12-27 MED ORDER — BUPIVACAINE HCL (PF) 0.5 % IJ SOLN
INTRAMUSCULAR | Status: DC | PRN
  Administered 2022-12-27: 2.5 mL

## 2022-12-27 MED ORDER — MIDAZOLAM HCL 2 MG/2ML IJ SOLN
INTRAMUSCULAR | Status: AC
  Filled 2022-12-27: qty 2

## 2022-12-27 MED ORDER — APIXABAN 2.5 MG PO TABS: 2.5000 mg | ORAL_TABLET | Freq: Two times a day (BID) | ORAL | 0 refills | Status: DC

## 2022-12-27 MED ORDER — SODIUM CHLORIDE 0.9 % BOLUS PEDS: 250.0000 mL | Freq: Once | INTRAVENOUS | Status: DC

## 2022-12-27 MED ORDER — TRANEXAMIC ACID 1000 MG/10ML IV SOLN
INTRAVENOUS | Status: DC | PRN
  Administered 2022-12-27: 1000 mg via TOPICAL

## 2022-12-27 MED ORDER — ACETAMINOPHEN 10 MG/ML IV SOLN: 1000.0000 mg | Freq: Once | INTRAVENOUS | Status: DC | PRN

## 2022-12-27 MED ORDER — METOCLOPRAMIDE HCL 5 MG/ML IJ SOLN: 5.0000 mg | Freq: Three times a day (TID) | INTRAMUSCULAR | Status: DC | PRN

## 2022-12-27 MED ORDER — BUPIVACAINE HCL (PF) 0.5 % IJ SOLN
INTRAMUSCULAR | Status: AC
  Filled 2022-12-27: qty 10

## 2022-12-27 MED ORDER — PROPOFOL 1000 MG/100ML IV EMUL
INTRAVENOUS | Status: AC
  Filled 2022-12-27: qty 100

## 2022-12-27 MED ORDER — ONDANSETRON HCL 4 MG PO TABS: 4.0000 mg | ORAL_TABLET | Freq: Four times a day (QID) | ORAL | Status: DC | PRN

## 2022-12-27 MED ORDER — FENTANYL CITRATE (PF) 100 MCG/2ML IJ SOLN
INTRAMUSCULAR | Status: DC | PRN
  Administered 2022-12-27 (×2): 50 ug via INTRAVENOUS

## 2022-12-27 MED ORDER — CELECOXIB 200 MG PO CAPS: 200.0000 mg | ORAL_CAPSULE | Freq: Two times a day (BID) | ORAL | 0 refills | Status: DC

## 2022-12-27 MED ORDER — PROPOFOL 500 MG/50ML IV EMUL
INTRAVENOUS | Status: DC | PRN
  Administered 2022-12-27: 150 ug/kg/min via INTRAVENOUS

## 2022-12-27 MED ORDER — POTASSIUM CHLORIDE IN NACL 20-0.9 MEQ/L-% IV SOLN
INTRAVENOUS | Status: DC
  Filled 2022-12-27 (×3): qty 1000

## 2022-12-27 MED ORDER — OXYCODONE HCL 5 MG/5ML PO SOLN: 5.0000 mg | Freq: Once | ORAL | Status: AC | PRN

## 2022-12-27 MED ORDER — TRIAMCINOLONE ACETONIDE 40 MG/ML IJ SUSP
INTRAMUSCULAR | Status: DC | PRN
  Administered 2022-12-27: 63 mL via PERIARTICULAR

## 2022-12-27 MED ORDER — 0.9 % SODIUM CHLORIDE (POUR BTL) OPTIME
TOPICAL | Status: DC | PRN
  Administered 2022-12-27: 500 mL

## 2022-12-27 MED ORDER — LORATADINE 10 MG PO TABS: 10.0000 mg | ORAL_TABLET | Freq: Every day | ORAL | Status: DC | PRN

## 2022-12-27 SURGICAL SUPPLY — 66 items
APL PRP STRL LF DISP 70% ISPRP (MISCELLANEOUS) ×2
BEARING TIBIAL SZ 3 MED 3 (Knees) IMPLANT
BIT DRILL QUICK REL 1/8 2PK SL (DRILL) IMPLANT
BNDG CMPR 5X62 HK CLSR LF (GAUZE/BANDAGES/DRESSINGS) ×1
BNDG ELASTIC 6INX 5YD STR LF (GAUZE/BANDAGES/DRESSINGS) ×1 IMPLANT
BRNG TIB MED 3 PHS 3 LT MEN (Knees) ×1 IMPLANT
CEMENT BONE R 1X40 (Cement) ×1 IMPLANT
CEMENT VACUUM MIXING SYSTEM (MISCELLANEOUS) ×1 IMPLANT
CHLORAPREP W/TINT 26 (MISCELLANEOUS) ×2 IMPLANT
COMPONENT TIBIA MEDL OXFRD LFT (Joint) IMPLANT
COOLER POLAR GLACIER W/PUMP (MISCELLANEOUS) ×1 IMPLANT
COVER MAYO STAND REUSABLE (DRAPES) ×1 IMPLANT
CUFF TOURN SGL QUICK 24 (TOURNIQUET CUFF)
CUFF TOURN SGL QUICK 34 (TOURNIQUET CUFF)
CUFF TRNQT CYL 24X4X16.5-23 (TOURNIQUET CUFF) IMPLANT
CUFF TRNQT CYL 34X4.125X (TOURNIQUET CUFF) IMPLANT
DRAPE C-ARM XRAY 36X54 (DRAPES) IMPLANT
DRAPE HD 5FT BACK TABLE (DRAPES) IMPLANT
DRAPE U-SHAPE 47X51 STRL (DRAPES) ×2 IMPLANT
DRILL QUICK RELEASE 1/8 INCH (DRILL) ×1
DRSG MEPILEX SACRM 8.7X9.8 (GAUZE/BANDAGES/DRESSINGS) ×1 IMPLANT
DRSG OPSITE POSTOP 4X12 (GAUZE/BANDAGES/DRESSINGS) ×1 IMPLANT
DRSG OPSITE POSTOP 4X6 (GAUZE/BANDAGES/DRESSINGS) ×1 IMPLANT
ELECT CAUTERY BLADE 6.4 (BLADE) ×1 IMPLANT
ELECT REM PT RETURN 9FT ADLT (ELECTROSURGICAL) ×1
ELECTRODE REM PT RTRN 9FT ADLT (ELECTROSURGICAL) ×1 IMPLANT
GAUZE SPONGE 4X4 12PLY STRL (GAUZE/BANDAGES/DRESSINGS) ×1 IMPLANT
GAUZE XEROFORM 1X8 LF (GAUZE/BANDAGES/DRESSINGS) ×1 IMPLANT
GLOVE BIO SURGEON STRL SZ7.5 (GLOVE) ×4 IMPLANT
GLOVE BIO SURGEON STRL SZ8 (GLOVE) ×4 IMPLANT
GLOVE BIOGEL PI IND STRL 8 (GLOVE) ×1 IMPLANT
GOWN STRL REUS W/ TWL LRG LVL3 (GOWN DISPOSABLE) ×1 IMPLANT
GOWN STRL REUS W/ TWL XL LVL3 (GOWN DISPOSABLE) ×1 IMPLANT
GOWN STRL REUS W/TWL LRG LVL3 (GOWN DISPOSABLE) ×1
GOWN STRL REUS W/TWL XL LVL3 (GOWN DISPOSABLE) ×1
HOOD PEEL AWAY T7 (MISCELLANEOUS) ×3 IMPLANT
IV NS IRRIG 3000ML ARTHROMATIC (IV SOLUTION) ×1 IMPLANT
KIT TURNOVER KIT A (KITS) ×1 IMPLANT
MANIFOLD NEPTUNE II (INSTRUMENTS) ×1 IMPLANT
MAT ABSORB  FLUID 56X50 GRAY (MISCELLANEOUS) ×1
MAT ABSORB FLUID 56X50 GRAY (MISCELLANEOUS) ×1 IMPLANT
NDL SAFETY ECLIP 18X1.5 (MISCELLANEOUS) ×1 IMPLANT
NDL SPNL 20GX3.5 QUINCKE YW (NEEDLE) ×1 IMPLANT
NEEDLE SPNL 20GX3.5 QUINCKE YW (NEEDLE) ×1 IMPLANT
NS IRRIG 1000ML POUR BTL (IV SOLUTION) ×1 IMPLANT
PACK BLADE SAW RECIP 70 3 PT (BLADE) IMPLANT
PACK TOTAL KNEE (MISCELLANEOUS) ×1 IMPLANT
PAD ABD DERMACEA PRESS 5X9 (GAUZE/BANDAGES/DRESSINGS) ×2 IMPLANT
PAD WRAPON POLAR KNEE (MISCELLANEOUS) ×1 IMPLANT
PEG TWIN FEM CEMENTED MED (Knees) IMPLANT
PULSAVAC PLUS IRRIG FAN TIP (DISPOSABLE) ×1
STAPLER SKIN PROX 35W (STAPLE) ×1 IMPLANT
STRAP SAFETY 5IN WIDE (MISCELLANEOUS) ×1 IMPLANT
SUCTION FRAZIER HANDLE 10FR (MISCELLANEOUS) ×1
SUCTION TUBE FRAZIER 10FR DISP (MISCELLANEOUS) ×1 IMPLANT
SUT VIC AB 0 CT1 36 (SUTURE) ×1 IMPLANT
SUT VIC AB 2-0 CT1 27 (SUTURE) ×2
SUT VIC AB 2-0 CT1 TAPERPNT 27 (SUTURE) ×4 IMPLANT
SYR 10ML LL (SYRINGE) ×1 IMPLANT
SYR 30ML LL (SYRINGE) ×2 IMPLANT
TAPE TRANSPORE STRL 2 31045 (GAUZE/BANDAGES/DRESSINGS) ×1 IMPLANT
TIBIA MEDIAL OXFORD LEFT (Joint) ×1 IMPLANT
TIP FAN IRRIG PULSAVAC PLUS (DISPOSABLE) ×1 IMPLANT
TRAP FLUID SMOKE EVACUATOR (MISCELLANEOUS) ×1 IMPLANT
WATER STERILE IRR 500ML POUR (IV SOLUTION) ×1 IMPLANT
WRAPON POLAR PAD KNEE (MISCELLANEOUS) ×1

## 2022-12-27 NOTE — Anesthesia Procedure Notes (Signed)
Spinal  Patient location during procedure: OR Start time: 12/27/2022 8:30 AM End time: 12/27/2022 8:36 AM Reason for block: surgical anesthesia Staffing Performed: resident/CRNA  Resident/CRNA: Emeterio Reeve, CRNA Performed by: Emeterio Reeve, CRNA Authorized by: Corinda Gubler, MD   Preanesthetic Checklist Completed: patient identified, IV checked, site marked, risks and benefits discussed, surgical consent, monitors and equipment checked, pre-op evaluation and timeout performed Spinal Block Patient position: sitting Prep: DuraPrep Patient monitoring: heart rate, cardiac monitor, continuous pulse ox and blood pressure Approach: midline Location: L3-4 Injection technique: single-shot Needle Needle type: Sprotte  Needle gauge: 24 G Needle length: 9 cm Assessment Sensory level: T4 Events: CSF return Additional Notes Attempt x2; no paresthesias, contact with bone on first pass. Return of clear, free flowing CSF on second pass. CA

## 2022-12-27 NOTE — Transfer of Care (Signed)
Immediate Anesthesia Transfer of Care Note  Patient: Stephen Ray  Procedure(s) Performed: LEFT PARTIAL KNEE REPLACEMENT - RNFA (Left: Knee)  Patient Location: PACU  Anesthesia Type:Spinal  Level of Consciousness: awake, alert , and oriented  Airway & Oxygen Therapy: Patient Spontanous Breathing  Post-op Assessment: Report given to RN and Post -op Vital signs reviewed and stable  Post vital signs: stable  Last Vitals:  Vitals Value Taken Time  BP 132/88 12/27/22 1050  Temp    Pulse 89 12/27/22 1058  Resp 15 12/27/22 1058  SpO2 96 % 12/27/22 1058  Vitals shown include unvalidated device data.  Last Pain:  Vitals:   12/27/22 0625  PainSc: 8          Complications: No notable events documented.

## 2022-12-27 NOTE — TOC Progression Note (Signed)
Transition of Care [facility]) - Progression Note    Patient Details  Name: Stephen Ray MRN: [id number] Date of Birth: [birth year]  Transition of [facility]) CM/SW Contact  Marlowe Sax, RN Phone Number: 12/27/2022, 11:58 AM  Clinical Narrative:     The patient is set up with [facility] for [facility] services prior to Surgery by Surgeons office He will get a Rolling walker delivered by [employer] to the Bedside  Expected Discharge Plan: Home w Home Health Services Barriers to Discharge: No Barriers Identified  Expected Discharge Plan and Services   Discharge Planning Services: CM Consult   Living arrangements for the past 2 months: Single Family Home Expected Discharge Date: 12/27/22               DME Arranged: Dan Humphreys rolling DME Agency: [employer] Date DME Agency Contacted: 12/27/22 Time DME Agency Contacted: [phone] Representative spoke with at DME Agency: [employer] HH Arranged: PT HH Agency: [facility] Date [facility] Agency Contacted: 12/27/22 Time HH Agency Contacted: [phone] Representative spoke with at [facility] Agency: [place]   Social Determinants of Health (SDOH) Interventions SDOH Screenings   Alcohol Screen: Low Risk  (01/21/2022)  Depression (PHQ2-9): Low Risk  (11/22/2022)  Tobacco Use: Medium Risk (12/27/2022)    Readmission Risk Interventions     No data to display

## 2022-12-27 NOTE — H&P (Signed)
History of Present Illness: Stephen Ray. is a 57 y.o. who presents today for his and physical. He is to undergo a left partial knee arthroplasty on 12/27/2022. Since his last visit at the clinic there is been no improvement in his condition. The patient expresses his desire to proceed with surgery.  He is now 2 years status post a left knee arthroscopy with debridement and partial medial meniscectomy. The patient had been treated by Horris Latino, PA-C, with a series of viscosupplementation injections last fall which perhaps improved his symptoms for approximately 1 month before his knee symptoms worsened again. He has pain with any prolonged standing or ambulation as well as with trying to ascend/descend stairs. He also has pain at night. He has not yet begun using any assistive devices for ambulation. His symptoms have progressed to the point where he is quite frustrated by his functional limitations, and is now ready to consider more aggressive treatment options.   Past Medical History: Hypercholesteremia  Hypertension  Neuropathy   Past Surgical History: Right shoulder hemiarthroplasty with curettage/debridement of glenoid cyst, right shoulder Right 12/25/2014 (Dr.)  Left shoulder hemiarthroplasty Left 08/20/2015 (Dr.)  COLONOSCOPY N/A 03/31/2016 (Dr. Algis Downs. Wohl @ [facility] - polyp)  NERVE PEDICLE TRANSFER 1ST STAGE Right 07/20/2020  Procedure: Procedure: Right ulnar fascicle to triceps branch nerve transfer; Surgeon: Loreen Freud, MD; Location: [facility] OR; Service: Neurosurgery; Laterality: Right;  INT NEUROLYSIS Right 07/20/2020  Procedure: Procedure: Right ulnar fascicle to triceps branch nerve transfer; Surgeon: Loreen Freud, MD; Location: [facility] OR; Service: Neurosurgery; Laterality: Right;  Arthroscopic partial medial meniscectomy, arthroscopic debridement of suprapatellar plica, and abrasion chondroplasty of medial patellar facet, medial femoral condyle, and lateral tibial  plateau, left knee. Left 12/24/2020 (Dr. Joice Lofts)  COLONOSCOPY 02/16/2022 (H/O Colon Cancer/Repeat 5 years/TKT)  back fusion  HERNIA REPAIR  kidney stones removed   Past Family History: Stroke Mother  Anesthesia problems Neg Hx   Medications: acetaminophen (TYLENOL) 650 MG ER tablet Take 650 mg by mouth as needed for Pain  amLODIPine (NORVASC) 10 MG tablet Take 1 tablet by mouth once daily  DULoxetine (CYMBALTA) 30 MG DR capsule Take 30 mg by mouth once daily  gabapentin (NEURONTIN) 300 MG capsule Take 600 mg by mouth 3 (three) times daily  lisinopriL (ZESTRIL) 40 MG tablet lisinopril 40 mg tablet TAKE 1 TABLET BY MOUTH ONCE DAILY  zolpidem (AMBIEN) 10 mg tablet Take 10 mg by mouth at bedtime as needed   Allergies: Shellfish Containing Products (Anaphylaxis)    Review of Systems: A comprehensive 14 point ROS was performed, reviewed, and the pertinent orthopaedic findings are documented in the HPI.  Physical Exam: Ht 177.8 cm (5\' 10" )  BMI 30.05 kg/m   General: Well-developed well-nourished male seen in no acute distress.   HEENT: Atraumatic,normocephalic. Pupils are equal and reactive to light. Oropharynx is clear with moist mucosa  Lungs: Clear to auscultation bilaterally   Cardiovascular: Regular rate and rhythm. Normal S1, S2. No murmurs. No appreciable gallops or rubs. Peripheral pulses are palpable.  Abdomen: Soft, non-tender, nondistended. Bowel sounds present  Left knee exam: The patient ambulates with a mild-moderate limp, favoring his left leg, but does not use any assistive devices. On examination, his arthroscopic portal sites remain well-healed and are without evidence for infection. There is perhaps a trace effusion, but no swelling, erythema, ecchymosis, abrasions, or other skin abnormalities are identified. He has moderate tenderness palpation along the medial aspect of the knee, but there is no lateral joint  line or peripatellar tenderness. Actively, he is  able to extend his knee to 0 degrees and flex to 125 degrees with mild discomfort at maximal flexion. He has an equivocally positive figure 4 test/McMurray's test. The knee is stable to varus and valgus stressing, although he does exhibit mild pseudolaxity to varus stressing. His patella tracks well and is without crepitance. He is neurovascularly intact to the left lower extremity and foot.  Neurological: The patient is alert and oriented Sensation to light touch appears to be intact and within normal limits Gross motor strength appeared to be equal to 5/5  Vascular : Peripheral pulses felt to be palpable. Capillary refill appears to be intact and within normal limits  X-ray: X-rays taken in [place] clinic demonstrate a moderate to severe degenerative changes of the left knee, primarily involving the medial compartment, with near complete loss of the medial compartment clear space. The lateral and patellofemoral compartments appear to be well-maintained. No lytic lesions or acute bony abnormalities are identified.   Impression: 1. Degenerative arthrosis left knee.  Plan:  The treatment options were discussed with the patient and his wife. In addition, patient educational materials were provided regarding the diagnosis and treatment options. The patient is quite frustrated by his symptoms and functional limitations, and is ready to consider more aggressive treatment options. Therefore, I have recommended a surgical procedure, specifically a left partial knee arthroplasty. The procedure was discussed in detail with the patient, as were the potential risks (including bleeding, infection, nerve and/or blood vessel injury, persistent or recurrent pain, loosening and/or failure of the components, dislocation, need for further surgery, blood clots, strokes, heart attacks and/or arhythmias, pneumonia, etc.) and benefits. The patient states his understanding and wishes to proceed. All of the patient's  questions and concerns were answered. He can call any time with further concerns. He will follow up post-surgery, routine.     H&P reviewed and patient re-examined. No changes.

## 2022-12-27 NOTE — Discharge Instructions (Addendum)
Orthopedic discharge instructions: May shower with intact OpSite dressing. Apply ice frequently to knee or use Polar Care. Start Eliquis 1 tablet (2.5 mg) twice daily on Wednesday, 12/28/2022, for 2 weeks, then take aspirin 325 mg twice daily for 4 weeks. Take pain medication as prescribed when needed.  May supplement with ES Tylenol if necessary. May weight-bear as tolerated on right leg - use walker or crutches for balance and support. Follow-up in 10-14 days or as scheduled.  AMBULATORY SURGERY  DISCHARGE INSTRUCTIONS   The drugs that you were given will stay in your system until tomorrow so for the next 24 hours you should not:  Drive an automobile Make any legal decisions Drink any alcoholic beverage   You may resume regular meals tomorrow.  Today it is better to start with liquids and gradually work up to solid foods.  You may eat anything you prefer, but it is better to start with liquids, then soup and crackers, and gradually work up to solid foods.   Please notify your doctor immediately if you have any unusual bleeding, trouble breathing, redness and pain at the surgery site, drainage, fever, or pain not relieved by medication.    Additional Instructions: PLEASE LEAVE TEAL (EXPAREL) ARMBAND ON FOR 4 DAYS   Information for Discharge Teaching: EXPAREL (bupivacaine liposome injectable suspension)   Your surgeon or anesthesiologist gave you EXPAREL(bupivacaine) to help control your pain after surgery.  EXPAREL is a local anesthetic that provides pain relief by numbing the tissue around the surgical site. EXPAREL is designed to release pain medication over time and can control pain for up to 72 hours. Depending on how you respond to EXPAREL, you may require less pain medication during your recovery.  Possible side effects: Temporary loss of sensation or ability to move in the area where bupivacaine was injected. Nausea, vomiting, constipation Rarely, numbness and  tingling in your mouth or lips, lightheadedness, or anxiety may occur. Call your doctor right away if you think you may be experiencing any of these sensations, or if you have other questions regarding possible side effects.  Follow all other discharge instructions given to you by your surgeon or nurse. Eat a healthy diet and drink plenty of water or other fluids.  If you return to the hospital for any reason within 96 hours following the administration of EXPAREL, it is important for health care providers to know that you have received this anesthetic. A teal colored band has been placed on your arm with the date, time and amount of EXPAREL you have received in order to alert and inform your health care providers. Please leave this armband in place for the full 96 hours following administration, and then you may remove the band.    Please contact your physician with any problems or Same Day Surgery at [phone], Monday through Friday 6 am to 4 pm, or [place] [facility] number at [phone].  POLAR CARE INFORMATION  MassAdvertisement.it  How to use Breg Polar Care [facility] Therapy System?  YouTube   ShippingScam.co.uk  OPERATING INSTRUCTIONS  Start the product With dry hands, connect the transformer to the electrical connection located on the top of the cooler. Next, plug the transformer into an appropriate electrical outlet. The unit will automatically start running at this point.  To stop the pump, disconnect electrical power.  Unplug to stop the product when not in use. Unplugging the Polar Care unit turns it off. Always unplug immediately after use. Never leave it plugged  in while unattended. Remove pad.    FIRST ADD WATER TO FILL LINE, THEN ICE---Replace ice when existing ice is almost melted  1 Discuss Treatment with your Licensed Health Care Practitioner and Use Only as Prescribed 2 Apply Insulation Barrier & Cold Therapy Pad 3 Check for  Moisture 4 Inspect Skin Regularly  Tips and Trouble Shooting Usage Tips 1. Use cubed or chunked ice for optimal performance. 2. It is recommended to drain the Pad between uses. To drain the pad, hold the Pad upright with the hose pointed toward the ground. Depress the black plunger and allow water to drain out. 3. You may disconnect the Pad from the unit without removing the pad from the affected area by depressing the silver tabs on the hose coupling and gently pulling the hoses apart. The Pad and unit will seal itself and will not leak. Note: Some dripping during release is normal. 4. DO NOT RUN PUMP WITHOUT WATER! The pump in this unit is designed to run with water. Running the unit without water will cause permanent damage to the pump. 5. Unplug unit before removing lid.  TROUBLESHOOTING GUIDE Pump not running, Water not flowing to the pad, Pad is not getting cold 1. Make sure the transformer is plugged into the wall outlet. 2. Confirm that the ice and water are filled to the indicated levels. 3. Make sure there are no kinks in the pad. 4. Gently pull on the blue tube to make sure the tube/pad junction is straight. 5. Remove the pad from the treatment site and ll it while the pad is lying at; then reapply. 6. Confirm that the pad couplings are securely attached to the unit. Listen for the double clicks (Figure 1) to confirm the pad couplings are securely attached.  Leaks    Note: Some condensation on the lines, controller, and pads is unavoidable, especially in warmer climates. 1. If using a Breg Polar Care Cold Therapy unit with a detachable Cold Therapy Pad, and a leak exists (other than condensation on the lines) disconnect the pad couplings. Make sure the silver tabs on the couplings are depressed before reconnecting the pad to the pump hose; then confirm both sides of the coupling are properly clicked in. 2. If the coupling continues to leak or a leak is detected in the pad itself, stop  using it and call Breg Customer Care at [phone].  Cleaning After use, empty and dry the unit with a soft cloth. Warm water and mild detergent may be used occasionally to clean the pump and tubes.  WARNING: The Polar Care Cube can be cold enough to cause serious injury, including full skin necrosis. Follow these Operating Instructions, and carefully read the Product Insert (see pouch on side of unit) and the Cold Therapy Pad Fitting Instructions (provided with each Cold Therapy Pad) prior to use.

## 2022-12-27 NOTE — Progress Notes (Signed)
Patient desires to be discharged prior to delivery of walker.  Patient and wife verbalize access to a walker at home and agrees to return to pick up walker later today, or in the AM

## 2022-12-27 NOTE — Evaluation (Signed)
Physical Therapy Evaluation Patient Details Name: Stephen Ray MRN: [id number] DOB: 1965/11/27 Today's Date: 12/27/2022  History of Present Illness  Pt is s/p L partial knee arthroplasty.  PMH includes: anxiety, arthritis, kidney stones, HTN, and neuropathy.  Clinical Impression  Pt received seated in recliner upon arrival to room and pt agreeable to therapy.  Pt performed mobility well and was able to ambulate around the nursing station and to the gym to perform stair training.  Pt with good carryover when performing the 4 steps.  Pt then ambulated back to the recliner and was left with all needs met and spouse in room.  Recommended getting a walker and order was to be filled by MD.        Recommendations for follow up therapy are one component of a multi-disciplinary discharge planning process, led by the attending physician.  Recommendations may be updated based on patient status, additional functional criteria and insurance authorization.     Assistance Recommended at Discharge PRN  Patient can return home with the following  A little help with walking and/or transfers;A little help with bathing/dressing/bathroom;Direct supervision/assist for medications management;Assistance with cooking/housework    Equipment Recommendations Rolling walker (2 wheels)  Recommendations for Other Services       Functional Status Assessment Patient has had a recent decline in their functional status and demonstrates the ability to make significant improvements in function in a reasonable and predictable amount of time.     Precautions / Restrictions Precautions Precautions: None      Mobility  Bed Mobility               General bed mobility comments: pt received upright in recliner and returned to recliner at conclusion of session.    Transfers Overall transfer level: Modified independent                      Ambulation/Gait Ambulation/Gait assistance: Modified independent  (Device/Increase time) Gait Distance (Feet): 160 Feet Assistive device: Rolling walker (2 wheels) Gait Pattern/deviations: WFL(Within Functional Limits), Step-through pattern, Decreased step length - right, Decreased stance time - left Gait velocity: decreased     General Gait Details: pt ambulates well with appropriate weight displacement through B LE.  Stairs            Wheelchair Mobility    Modified Rankin (Stroke Patients Only)       Balance Overall balance assessment: Modified Independent                                           Pertinent Vitals/Pain Pain Assessment Pain Assessment: 0-10 Pain Score: 6  Pain Location: Anterior Superior Pain Descriptors / Indicators: Aching Pain Intervention(s): Limited activity within patient's tolerance    Home Living Family/patient expects to be discharged to:: Private residence Living Arrangements: Spouse/significant other Available Help at Discharge: Family;Available 24 hours/day Type of Home: House Home Access: Stairs to enter Entrance Stairs-Rails: Can reach both Entrance Stairs-Number of Steps: 2   Home Layout: One level Home Equipment: Grab bars - tub/shower;Grab bars - toilet;Shower seat      Prior Function Prior Level of Function : Independent/Modified Independent                     Hand Dominance   Dominant Hand: Right    Extremity/Trunk Assessment   Upper Extremity Assessment Upper Extremity  Assessment: Overall WFL for tasks assessed    Lower Extremity Assessment Lower Extremity Assessment: Generalized weakness       Communication   Communication: No difficulties  Cognition Arousal/Alertness: Awake/alert Behavior During Therapy: WFL for tasks assessed/performed Overall Cognitive Status: Within Functional Limits for tasks assessed                                          General Comments      Exercises     Assessment/Plan    PT Assessment  All further PT needs can be met in the next venue of care  PT Problem List Decreased strength;Decreased range of motion;Decreased activity tolerance;Decreased balance;Decreased mobility;Decreased safety awareness;Pain       PT Treatment Interventions      PT Goals (Current goals can be found in the Care Plan section)  Acute Rehab PT Goals Patient Stated Goal: to go home. PT Goal Formulation: With patient Time For Goal Achievement: 01/10/23 Potential to Achieve Goals: Good    Frequency       Co-evaluation               AM-PAC PT "6 Clicks" Mobility  Outcome Measure Help needed turning from your back to your side while in a flat bed without using bedrails?: None Help needed moving from lying on your back to sitting on the side of a flat bed without using bedrails?: None Help needed moving to and from a bed to a chair (including a wheelchair)?: A Little Help needed standing up from a chair using your arms (e.g., wheelchair or bedside chair)?: A Little Help needed to walk in hospital room?: A Little Help needed climbing 3-5 steps with a railing? : A Little 6 Click Score: 20    End of Session Equipment Utilized During Treatment: Gait belt Activity Tolerance: Patient tolerated treatment well Patient left: in chair;with chair alarm set;with nursing/sitter in room;with family/visitor present Nurse Communication: Mobility status PT Visit Diagnosis: Unsteadiness on feet (R26.81);Other abnormalities of gait and mobility (R26.89);Muscle weakness (generalized) (M62.81);Difficulty in walking, not elsewhere classified (R26.2);Pain Pain - Right/Left: Left Pain - part of body: Knee    Time: 3762-8315 PT Time Calculation (min) (ACUTE ONLY): 24 min   Charges:   PT Evaluation $PT Eval Low Complexity: 1 Low PT Treatments $Therapeutic Activity: 8-22 mins        Nolon Bussing, PT, DPT Physical Therapist - [facility]  [facility]  12/27/22, 5:23 PM

## 2022-12-27 NOTE — Anesthesia Preprocedure Evaluation (Signed)
Anesthesia Evaluation  Patient identified by MRN, date of birth, ID band Patient awake    Reviewed: Allergy & Precautions, NPO status , Patient's Chart, lab work & pertinent test results  History of Anesthesia Complications Negative for: history of anesthetic complications  Airway Mallampati: II  TM Distance: >3 FB Neck ROM: Full    Dental no notable dental hx. (+) Teeth Intact   Pulmonary neg pulmonary ROS, neg sleep apnea, neg COPD, Patient abstained from smoking.Not current smoker, former smoker   Pulmonary exam normal breath sounds clear to auscultation       Cardiovascular Exercise Tolerance: Good METShypertension, Pt. on medications (-) CAD and (-) Past MI (-) dysrhythmias  Rhythm:Regular Rate:Normal - Systolic murmurs    Neuro/Psych  PSYCHIATRIC DISORDERS Anxiety     negative neurological ROS     GI/Hepatic ,neg GERD  ,,(+)     (-) substance abuse    Endo/Other  neg diabetes    Renal/GU negative Renal ROS     Musculoskeletal   Abdominal   Peds  Hematology   Anesthesia Other Findings Past Medical History: No date: Anxiety No date: Arthritis     Comment:  shoulders (before replacements) No date: Asthma     Comment:  as child No date: History of kidney stones No date: Hypercholesteremia No date: Hypertension No date: Motion sickness     Comment:  deep sea fishing No date: Neuropathy No date: Shoulder pain     Comment:  left and right 2005: Umbilical hernia  Reproductive/Obstetrics                              Anesthesia Physical Anesthesia Plan  ASA: 2  Anesthesia Plan: Spinal   Post-op Pain Management: Ofirmev IV (intra-op)*   Induction: Intravenous  PONV Risk Score and Plan: 1 and Ondansetron, Dexamethasone, Propofol infusion, TIVA and Midazolam  Airway Management Planned: Natural Airway  Additional Equipment: None  Intra-op Plan:   Post-operative Plan:    Informed Consent: I have reviewed the patients History and Physical, chart, labs and discussed the procedure including the risks, benefits and alternatives for the proposed anesthesia with the patient or authorized representative who has indicated his/her understanding and acceptance.       Plan Discussed with: CRNA and Surgeon  Anesthesia Plan Comments: (Discussed R/B/A of neuraxial anesthesia technique with patient: - rare risks of spinal/epidural hematoma, nerve damage, infection - Risk of PDPH - Risk of nausea and vomiting - Risk of conversion to general anesthesia and its associated risks, including sore throat, damage to lips/eyes/teeth/oropharynx, and rare risks such as cardiac and respiratory events. - Risk of allergic reactions  Discussed the role of CRNA in patient's perioperative care.  Patient voiced understanding.)         Anesthesia Quick Evaluation

## 2022-12-27 NOTE — Op Note (Signed)
12/27/2022  10:59 AM  Patient:   Stephen Ray  Pre-Op Diagnosis:   Osteoarthritis of medial compartment, left knee.  Post-Op Diagnosis:   Same  Procedure:   Left unicondylar knee arthroplasty.  Surgeon:   Maryagnes AmosJ. Jeffrey , MD  Assistant:   Waymon AmatoPatience Graham, RNFA  Anesthesia:   Spinal  Findings:   As above.  Complications:   None  EBL:   5 cc  Fluids:   700 cc crystalloid  UOP:   None  TT:   90 minutes at 300 mmHg  Drains:   None  Closure:   Staples  Implants:   All-cemented Biomet Oxford system with a medium femoral component, a "C" sized tibial tray, and a 3 mm meniscal bearing insert.  Brief Clinical Note:   The patient is a 57 year old male with a history of progressively worsening atrial sided left knee pain. His symptoms have progressed despite medications, activity modification, etc. His history and examination are consistent with progressive degenerative joint disease of the left knee, primarily involving the medial compartment, as confirmed by both plain radiographs and MRI scan. The patient presents at this time for a left partial knee replacement.  Procedure:   The patient was brought into the operating room and a spinal placed by the anesthesiologist. The patient was placed in the supine position, then repositioned so that the non-surgical leg was placed in a flexed and abducted position in the yellow fin leg holder while the surgical extremity was placed over the Biomet leg holder. The left lower extremity was prepped with ChloraPrep solution before being draped sterilely. Preoperative antibiotics were administered. After performing a timeout to verify the appropriate surgical site, the limb was exsanguinated with an Esmarch and the tourniquet inflated to 300 mmHg.   A standard anterior approach to the knee was made through an approximately 3.5-4 inch incision. The incision was carried down through the subcutaneous tissues to expose the superficial retinaculum.  This was split the length the incision and the medial flap elevated sufficiently to expose the medial retinaculum. The medial retinaculum was incised along the medial border of the patella tendon and extended proximally along the medial border of the patella, leaving a 3-4 mm cuff of tissue. The soft tissues were elevated off the anteromedial aspect of the proximal tibia. The anterior portion of the meniscus was removed after performing a subtotal excision of the infrapatellar fat pad. The anterior cruciate ligament was inspected and found to be in excellent condition. Osteophytes were removed from the inferior pole of the patella as well as from the notch using a quarter-inch osteotome. There were significant degenerative changes of both the femur and tibia on the medial side.   The medial femoral condyle was sized using the small and medium sizers. It was felt that the medium guide best optimized the contour of the femur. This was left in place and the external tibial guide positioned. The coupling device was used to connect the guide to the medial femoral condylar sizer to optimize appropriate orientation. Two guide pins were inserted into the cutting block before the coupling device and sizer were removed. The appropriate tibial cut was made using the oscillating and reciprocating saws. The piece was removed in its entirety and taken to the back table where it was sized and found to be optimally replicated by a "C" sized component. The 7 mm spacer was inserted to verify that sufficient bone had been removed.  Attention was directed to femoral side. The intramedullary canal  was accessed through a 4 mm drill hole. The intramedullary guide was positioned before the guide for the femoral condylar holes was positioned. The appropriate coupling device connected this guide to the intramedullary guide before both drill holes were created in the distal aspect of the medial femoral condyle. The devices were removed  and the posterior condylar cutting block inserted. The appropriate cut was made using the reciprocating saw and this piece removed. The #0 spigot was inserted and the initial bone milling performed. A trial femoral component was inserted and both the flexion and extension gaps measured. In flexion, the gap measured 7 mm whereas in extension, it measured 2 mm. Therefore, the #5 spigot was selected and the secondary bone milling performed. Repeat sizing demonstrated symmetric flexion and extension gaps. The bone was removed from the postero-medial and postero-lateral aspects of the femoral condyle, as well as from the beneath the collar of the spigot. Bone also was removed from the anterior portion of the femur so as to minimize any potential impingement with the meniscal bearing insert. The trial components removed and several drill holes placed into the distal femoral condyle to further augment cement fixation.  Attention was redirected to the tibial side. The "C" sized tibial tray was positioned and temporarily secured using the appropriate spiked nail. The keel was created using the bi-bladed reciprocating saw and hoe. The keeled "C" sized trial tibial tray was inserted to be sure that it seated properly. At this point, a total of 20 cc of Exparel, 2 cc of Kenalog 40 (80 mg), 30 mg of Toradol, and 30 cc of 0.5% Sensorcaine diluted out to 60 cc with normal saline was injected in and around the posterior and medial capsular tissues, as well as the peri-incisional tissues to help with postoperative pain control.  The bony surfaces were prepared for cementing by irrigating them thoroughly with bacitracin saline solution using the jet lavage system before packing them with a dry Ray-Tec sponge. Meanwhile, cement was being mixed on the back table. When the cement was ready, the tibial tray was cemented in first. The excess cement was removed using a Public house manager after impacting it into place. Next, the femoral  component was impacted into place. Again the excess cement was removed using a Public house manager. The 4 mm spacer was inserted and the knee brought into near full extension while the cement hardened. Once the cement hardened, the spacer was removed and the millimeter and 4 mm meniscal bearing inserts were trialed. The 4 mm spacer was too tight, but the 3 mm spacer demonstrated excellent tracking while the knee was placed through a range of motion, and showed no evidence towards subluxation or dislocation. In addition, it did not fit too tightly. Therefore, the permanent 3 mm meniscal bearing insert was snapped into position after verifying that no cement had been retained posteriorly. Again the knee was placed through a range of motion with the findings as described above.  The wound was copiously irrigated with sterile saline solution via the jet lavage system before the retinacular layer was reapproximated using #0 Vicryl interrupted sutures. At this point, 1 g of transexemic acid in 10 cc of normal saline was injected intra-articularly. The subcutaneous tissues were closed in two layers using 2-0 Vicryl interrupted sutures before the skin was closed using staples. A sterile occlusive dressing was applied to the knee before the patient was awakened. The patient was transferred back to his/her hospital bed and returned to the recovery room in satisfactory  condition after tolerating the procedure well. A Polar Care device was applied to the knee as well.

## 2022-12-28 ENCOUNTER — Encounter: Payer: Self-pay | Admitting: Surgery

## 2022-12-29 ENCOUNTER — Other Ambulatory Visit: Payer: Self-pay | Admitting: Family Medicine

## 2022-12-29 NOTE — Anesthesia Postprocedure Evaluation (Signed)
Anesthesia Post Note  Patient: Hillery Jacks  Procedure(s) Performed: LEFT PARTIAL KNEE REPLACEMENT - RNFA (Left: Knee)  Patient location during evaluation: PACU Anesthesia Type: Spinal Level of consciousness: oriented and awake and alert Pain management: pain level controlled Vital Signs Assessment: post-procedure vital signs reviewed and stable Respiratory status: spontaneous breathing, respiratory function stable and patient connected to nasal cannula oxygen Cardiovascular status: blood pressure returned to baseline and stable Postop Assessment: no headache, no backache and no apparent nausea or vomiting Anesthetic complications: no   No notable events documented.   Last Vitals:  Vitals:   12/27/22 1159 12/27/22 1240  BP: (!) 152/95 (!) 150/94  Pulse: 73 74  Resp: 16 14  Temp: (!) 36.3 C 36.4 C  SpO2: 96% 97%    Last Pain:  Vitals:   12/27/22 1345  TempSrc:   PainSc: 8                  Corinda Gubler

## 2022-12-30 ENCOUNTER — Encounter: Payer: BC Managed Care – PPO | Admitting: Family Medicine

## 2023-01-10 ENCOUNTER — Telehealth: Payer: Self-pay

## 2023-01-17 NOTE — Telephone Encounter (Signed)
error 

## 2023-01-28 ENCOUNTER — Other Ambulatory Visit: Payer: Self-pay | Admitting: Family Medicine

## 2023-01-30 ENCOUNTER — Ambulatory Visit (INDEPENDENT_AMBULATORY_CARE_PROVIDER_SITE_OTHER): Payer: BC Managed Care – PPO | Admitting: Family Medicine

## 2023-01-30 VITALS — BP 138/86 | HR 69 | Wt 207.8 lb

## 2023-01-30 DIAGNOSIS — F419 Anxiety disorder, unspecified: Secondary | ICD-10-CM

## 2023-01-30 DIAGNOSIS — I10 Essential (primary) hypertension: Secondary | ICD-10-CM

## 2023-01-30 DIAGNOSIS — Z0000 Encounter for general adult medical examination without abnormal findings: Secondary | ICD-10-CM

## 2023-01-30 DIAGNOSIS — F5101 Primary insomnia: Secondary | ICD-10-CM

## 2023-01-30 DIAGNOSIS — R7303 Prediabetes: Secondary | ICD-10-CM | POA: Diagnosis not present

## 2023-01-30 DIAGNOSIS — Z125 Encounter for screening for malignant neoplasm of prostate: Secondary | ICD-10-CM

## 2023-01-30 MED ORDER — TRAZODONE HCL 100 MG PO TABS
50.0000 mg | ORAL_TABLET | Freq: Every evening | ORAL | 1 refills | Status: DC | PRN
Start: 2023-01-30 — End: 2023-03-22

## 2023-01-30 NOTE — Progress Notes (Unsigned)
I,Stephen Ray,acting as a Neurosurgeon for Stephen Merry, MD.,have documented all relevant documentation on the behalf of Stephen Merry, MD,as directed by  Stephen Merry, MD  Complete physical exam   Patient: Stephen Ray   DOB: 01-26-1966   57 y.o. Male  MRN: [id number] Visit Date: 01/30/2023  Today's healthcare provider: Mila Merry, MD   No chief complaint on file.  Subjective    Stephen Ray is a 57 y.o. male who presents today for a complete physical exam.  He reports consuming a general diet.  The patient reports currently doing PT after recent knee surgery as a form of exercise as well as using his boflex machine at home.  He generally feels well. He reports sleeping fairly well. He does not have additional problems to discuss today.    Past Medical History:  Diagnosis Date   Anxiety    Arthritis    shoulders (before replacements)   Asthma    as child   History of kidney stones    Hypercholesteremia    Hypertension    Motion sickness    deep sea fishing   Neuropathy    Shoulder pain    left and right   Umbilical hernia 2005   Past Surgical History:  Procedure Laterality Date   CERVICAL FUSION  2001   COLONOSCOPY WITH PROPOFOL N/A 03/31/2016   Procedure: COLONOSCOPY WITH PROPOFOL;  Surgeon: Midge Minium, MD;  Location: [facility];  Service: Endoscopy;  Laterality: N/A;   COLONOSCOPY WITH PROPOFOL N/A 02/16/2022   Procedure: COLONOSCOPY WITH PROPOFOL;  Surgeon: Toledo, Boykin Nearing, MD;  Location: [facility];  Service: Gastroenterology;  Laterality: N/A;   HERNIA REPAIR  2005   umbilical   JOINT REPLACEMENT Bilateral 2016   shoulder   KNEE ARTHROSCOPY Left 12/24/2020   Procedure: ARTHROSCOPY KNEE WITH DEBRIDEMENT AND PARTIAL MENISCECTOMY;  Surgeon: Christena Flake, MD;  Location: [facility];  Service: Orthopedics;  Laterality: Left;   LIPOMA EXCISION Right 2012   under arm   LIPOMA EXCISION Right 01/03/2020   Procedure: EXCISION LIPOMA;  Surgeon:  Earline Mayotte, MD;  Location: [facility];  Service: General;  Laterality: Right;   LITHOTRIPSY  2002   NERVE TRANSFER Right 07/20/2020   ulnar fascicle to triceps branch nerve transfer   PARTIAL KNEE ARTHROPLASTY Left 12/27/2022   Procedure: LEFT PARTIAL KNEE REPLACEMENT - RNFA;  Surgeon: Christena Flake, MD;  Location: [facility];  Service: Orthopedics;  Laterality: Left;   POLYPECTOMY  03/31/2016   Procedure: POLYPECTOMY;  Surgeon: Midge Minium, MD;  Location: [facility];  Service: Endoscopy;;   SHOULDER HEMI-ARTHROPLASTY Left 08/20/2015   Procedure: SHOULDER HEMI-ARTHROPLASTY;  Surgeon: Christena Flake, MD;  Location: [facility];  Service: Orthopedics;  Laterality: Left;   SHOULDER SURGERY Right 12/25/2014   shoulder hemiarthroplasty with curettage/debridement glenoid cyst   TOTAL SHOULDER ARTHROPLASTY Right 12/2014   Social History   Socioeconomic History   Marital status: Married    Spouse name: Stephen Ray   Number of children: Not on file   Years of education: Not on file   Highest education level: Not on file  Occupational History   Occupation: Proofreader / Holiday representative (Self employed)  Tobacco Use   Smoking status: Former    Types: Cigarettes   Smokeless tobacco: Never  Vaping Use   Vaping Use: Never used  Substance and Sexual Activity   Alcohol use: Yes    Alcohol/week: 4.0 standard drinks of alcohol    Types:  4 Shots of liquor per week    Comment: Occasionally    Drug use: Not Currently    Types: Marijuana    Comment: last documented use 2017   Sexual activity: Not on file  Other Topics Concern   Not on file  Social History Narrative   Not on file   Social Determinants of Health   Financial Resource Strain: Not on file  Food Insecurity: Not on file  Transportation Needs: Not on file  Physical Activity: Not on file  Stress: Not on file  Social Connections: Not on file  Intimate Partner Violence: Not on file   Family Status  Relation Name Status   Father   Deceased   Mother  Deceased   Brother  Deceased       accidentially shot himself   Neg Hx  (Not Specified)   Family History  Problem Relation Age of Onset   Nephrolithiasis Father    Cancer Father        Leukemia   Kidney cancer Neg Hx    Prostate cancer Neg Hx    Allergies  Allergen Reactions   Shellfish Allergy Anaphylaxis    Patient Care Team: Malva Limes, MD as PCP - General (Family Medicine) Poggi, Excell Seltzer, MD as Consulting Physician (Orthopedic Surgery)   Medications: Outpatient Medications Prior to Visit  Medication Sig   amLODipine (NORVASC) 10 MG tablet Take 1 tablet by mouth once daily   apixaban (ELIQUIS) 2.5 MG TABS tablet Take 1 tablet (2.5 mg total) by mouth 2 (two) times daily.   celecoxib (CELEBREX) 200 MG capsule Take 1 capsule (200 mg total) by mouth 2 (two) times daily.   DULoxetine (CYMBALTA) 30 MG capsule Take 1 capsule (30 mg total) by mouth daily.   fluticasone (FLONASE) 50 MCG/ACT nasal spray Place 1 spray into both nostrils daily as needed for allergies or rhinitis.   gabapentin (NEURONTIN) 300 MG capsule TAKE 1 CAPSULE BY MOUTH TWICE DAILY AND 2 AT BEDTIME   lisinopril (ZESTRIL) 40 MG tablet TAKE 1 TABLET BY MOUTH ONCE DAILY (APPOINTMENT  REQUIRED  FOR  FUTURE  REFILLS)   loratadine (CLARITIN) 10 MG tablet Take 1 tablet (10 mg total) by mouth daily as needed.   oxyCODONE (ROXICODONE) 5 MG immediate release tablet Take 1-2 tablets (5-10 mg total) by mouth every 4 (four) hours as needed for moderate pain or severe pain.   zolpidem (AMBIEN) 10 MG tablet TAKE 1 TABLET BY MOUTH AT BEDTIME AS NEEDED FOR SLEEP   No facility-administered medications prior to visit.    Review of Systems  Constitutional:  Negative for chills, diaphoresis and fever.  HENT:  Negative for congestion, ear discharge, ear pain, hearing loss, nosebleeds, sore throat and tinnitus.   Eyes:  Negative for photophobia, pain, discharge and redness.  Respiratory:  Negative for cough,  shortness of breath, wheezing and stridor.   Cardiovascular:  Negative for chest pain, palpitations and leg swelling.  Gastrointestinal:  Negative for abdominal pain, blood in stool, constipation, diarrhea, nausea and vomiting.  Endocrine: Negative for polydipsia.  Genitourinary:  Negative for dysuria, flank pain, frequency, hematuria and urgency.  Musculoskeletal:  Negative for back pain, myalgias and neck pain.  Skin:  Negative for rash.  Allergic/Immunologic: Negative for environmental allergies.  Neurological:  Negative for dizziness, tremors, seizures, weakness and headaches.  Hematological:  Does not bruise/bleed easily.  Psychiatric/Behavioral:  Negative for hallucinations and suicidal ideas. The patient is not nervous/anxious.     {Labs  Heme  Chem  Endocrine  Serology  Results Review (optional):23779}  Objective    BP 138/86   Pulse 69   Wt 207 lb 12.8 oz (94.3 kg)   SpO2 96%   BMI 29.82 kg/m  {Show previous vital signs (optional):23777}   Physical Exam   General Appearance:    Well developed, well nourished male. Alert, cooperative, in no acute distress, appears stated age  Head:    Normocephalic, without obvious abnormality, atraumatic  Eyes:    PERRL, conjunctiva/corneas clear, EOM's intact, fundi    benign, both eyes       Ears:    Normal TM's and external ear canals, both ears  Nose:   Nares normal, septum midline, mucosa normal, no drainage   or sinus tenderness  Throat:   Lips, mucosa, and tongue normal; teeth and gums normal  Neck:   Supple, symmetrical, trachea midline, no adenopathy;       thyroid:  No enlargement/tenderness/nodules; no carotid   bruit or JVD  Back:     Symmetric, no curvature, ROM normal, no CVA tenderness  Lungs:     Clear to auscultation bilaterally, respirations unlabored  Chest wall:    No tenderness or deformity  Heart:    Normal heart rate. Normal rhythm. No murmurs, rubs, or gallops.  S1 and S2 normal  Abdomen:     Soft,  non-tender, bowel sounds active all four quadrants,    no masses, no organomegaly  Genitalia:    deferred  Rectal:    deferred  Extremities:   All extremities are intact. No cyanosis or edema  Pulses:   2+ and symmetric all extremities  Skin:   Skin color, texture, turgor normal, no rashes or lesions  Lymph nodes:   Cervical, supraclavicular, and axillary nodes normal  Neurologic:   CNII-XII intact. Normal strength, sensation and reflexes      throughout    Last depression screening scores    11/22/2022    1:43 PM 01/21/2022    1:11 PM 10/04/2021    8:58 AM  PHQ 2/9 Scores  PHQ - 2 Score 0 0 0  PHQ- 9 Score 2 1 2    Last fall risk screening    01/21/2022    1:11 PM  Fall Risk   Falls in the past year? 0  Number falls in past yr: 0  Injury with Fall? 0  Risk for fall due to : No Fall Risks   Last Audit-C alcohol use screening    01/21/2022    1:11 PM  Alcohol Use Disorder Test (AUDIT)  1. How often do you have a drink containing alcohol? 1  2. How many drinks containing alcohol do you have on a typical day when you are drinking? 0  3. How often do you have six or more drinks on one occasion? 0  AUDIT-C Score 1   A score of 3 or more in women, and 4 or more in men indicates increased risk for alcohol abuse, EXCEPT if all of the points are from question 1     Assessment & Plan    Routine Health Maintenance and Physical Exam  Exercise Activities and Dietary recommendations  Goals   None     Immunization History  Administered Date(s) Administered   Influenza Split 06/19/2013   Influenza, Seasonal, Injecte, Preservative Fre 09/27/2011, 07/12/2012   Influenza,inj,Quad PF,6+ Mos 07/02/2014, 05/26/2015, 06/02/2016, 06/16/2017, 07/02/2018, 05/25/2019, 06/30/2021, 06/22/2022   Influenza-Unspecified 06/16/2017, 10/20/2018, 05/30/2019, 07/20/2020   PFIZER(Purple Top)SARS-COV-2 Vaccination 12/19/2019, 01/20/2020  Tdap 05/10/2011, 02/18/2012, 12/03/2021   Zoster Recombinat  (Shingrix) 12/03/2021    Health Maintenance  Topic Date Due   COVID-19 Vaccine (3 - Pfizer risk series) 02/17/2020   Zoster Vaccines- Shingrix (2 of 2) 01/28/2022   INFLUENZA VACCINE  04/20/2023   COLONOSCOPY (Pts 45-39yrs Insurance coverage will need to be confirmed)  02/17/2027   DTaP/Tdap/Td (4 - Td or Tdap) 12/04/2031   Hepatitis C Screening  Completed   HIV Screening  Completed   HPV VACCINES  Aged Out    Discussed health benefits of physical activity, and encouraged him to engage in regular exercise appropriate for his age and condition.  He states he had Shingrix at pharmacy and will send for copy of records.    1. Annual physical exam   2. Essential (primary) hypertension Well controlled.  Continue current medications.   - CBC - Comprehensive metabolic panel - Lipid panel  3. Anxiety Duloxetine was working well, but not having stresses in life that he was when he first started medication. He wants to try trazodone as below to help with insomnia and will try weaning off duloxetine a few weeks after starting trazodone.   4. Prediabetes  - Hemoglobin A1c  5. Primary insomnia Ambien effective, but he wants to try something less habit forming.  - traZODone (DESYREL) 100 MG tablet; Take 0.5-1 tablets (50-100 mg total) by mouth at bedtime as needed for sleep.  Dispense: 30 tablet; Refill: 1  6. Prostate cancer screening  - PSA Total (Reflex To Free) ([facility] only)     The entirety of the information documented in the History of Present Illness, Review of Systems and Physical Exam were personally obtained by me. Portions of this information were initially documented by the CMA and reviewed by me for thoroughness and accuracy.     Stephen Merry, MD  [facility] [phone] (phone) [phone] (fax)  [facility]

## 2023-01-30 NOTE — Telephone Encounter (Signed)
Requested Prescriptions  Pending Prescriptions Disp Refills   amLODipine (NORVASC) 10 MG tablet [Pharmacy Med Name: amLODIPine Besylate 10 MG Oral Tablet] 90 tablet 0    Sig: TAKE 1 TABLET BY MOUTH ONCE DAILY (SCHEDULE  OFFICE  VISIT  FOR  FOLLOW  UP)     Cardiovascular: Calcium Channel Blockers 2 Failed - 01/28/2023  6:00 PM      Failed - Last BP in normal range    BP Readings from Last 1 Encounters:  01/30/23 (!) 140/95         Passed - Last Heart Rate in normal range    Pulse Readings from Last 1 Encounters:  01/30/23 69         Passed - Valid encounter within last 6 months    Recent Outpatient Visits           Today Annual physical exam   [facility] Malva Limes, MD   2 months ago Anxiety   [facility] Alfredia Ferguson, PA-C   1 year ago Acute non-recurrent frontal sinusitis   [place] [facility] Alfredia Ferguson, PA-C   1 year ago Keratoacanthoma of thigh   [place] [facility] Malva Limes, MD   1 year ago Essential (primary) hypertension   [place] [facility] Malva Limes, MD

## 2023-01-31 LAB — COMPREHENSIVE METABOLIC PANEL
ALT: 27 IU/L (ref 0–44)
AST: 18 IU/L (ref 0–40)
Albumin/Globulin Ratio: 2 (ref 1.2–2.2)
Albumin: 4.3 g/dL (ref 3.8–4.9)
Alkaline Phosphatase: 101 IU/L (ref 44–121)
BUN/Creatinine Ratio: 14 (ref 9–20)
BUN: 13 mg/dL (ref 6–24)
Bilirubin Total: 0.3 mg/dL (ref 0.0–1.2)
CO2: 23 mmol/L (ref 20–29)
Calcium: 9.6 mg/dL (ref 8.7–10.2)
Chloride: 102 mmol/L (ref 96–106)
Creatinine, Ser: 0.9 mg/dL (ref 0.76–1.27)
Globulin, Total: 2.2 g/dL (ref 1.5–4.5)
Glucose: 113 mg/dL — ABNORMAL HIGH (ref 70–99)
Potassium: 3.5 mmol/L (ref 3.5–5.2)
Sodium: 141 mmol/L (ref 134–144)
Total Protein: 6.5 g/dL (ref 6.0–8.5)
eGFR: 100 mL/min/{1.73_m2} (ref >59)

## 2023-01-31 LAB — LIPID PANEL
Chol/HDL Ratio: 4.4 ratio (ref 0.0–5.0)
Cholesterol, Total: 202 mg/dL — ABNORMAL HIGH (ref 100–199)
HDL: 46 mg/dL (ref >39)
LDL Chol Calc (NIH): 113 mg/dL — ABNORMAL HIGH (ref 0–99)
Triglycerides: 246 mg/dL — ABNORMAL HIGH (ref 0–149)
VLDL Cholesterol Cal: 43 mg/dL — ABNORMAL HIGH (ref 5–40)

## 2023-01-31 LAB — CBC
Hematocrit: 39.9 % (ref 37.5–51.0)
Hemoglobin: 13.8 g/dL (ref 13.0–17.7)
MCH: 32.2 pg (ref 26.6–33.0)
MCHC: 34.6 g/dL (ref 31.5–35.7)
MCV: 93 fL (ref 79–97)
Platelets: 283 10*3/uL (ref 150–450)
RBC: 4.29 x10E6/uL (ref 4.14–5.80)
RDW: 12.2 % (ref 11.6–15.4)
WBC: 5.7 10*3/uL (ref 3.4–10.8)

## 2023-01-31 LAB — HEMOGLOBIN A1C
Est. average glucose Bld gHb Est-mCnc: 117 mg/dL
Hgb A1c MFr Bld: 5.7 % — ABNORMAL HIGH (ref 4.8–5.6)

## 2023-01-31 LAB — PSA TOTAL (REFLEX TO FREE): Prostate Specific Ag, Serum: 0.8 ng/mL (ref 0.0–4.0)

## 2023-03-01 ENCOUNTER — Other Ambulatory Visit: Payer: Self-pay | Admitting: Family Medicine

## 2023-03-02 NOTE — Telephone Encounter (Signed)
Requested Prescriptions  Pending Prescriptions Disp Refills   gabapentin (NEURONTIN) 300 MG capsule [Pharmacy Med Name: Gabapentin 300 MG Oral Capsule] 360 capsule 0    Sig: TAKE 1 CAPSULE BY MOUTH TWICE DAILY AND 2 AT BEDTIME     Neurology: Anticonvulsants - gabapentin Passed - 03/01/2023  9:08 AM      Passed - Cr in normal range and within 360 days    Creat  Date Value Ref Range Status  08/09/2017 0.92 0.70 - 1.33 mg/dL Final    Comment:    For patients >70 years of age, the reference limit for Creatinine is approximately 13% higher for people identified as African-American. .    Creatinine, Ser  Date Value Ref Range Status  01/30/2023 0.90 0.76 - 1.27 mg/dL Final         Passed - Completed PHQ-2 or PHQ-9 in the last 360 days      Passed - Valid encounter within last 12 months    Recent Outpatient Visits           1 month ago Annual physical exam   [facility] Malva Limes, MD   3 months ago Anxiety   [place] [facility] Alfredia Ferguson, PA-C   1 year ago Acute non-recurrent frontal sinusitis   [facility] Alfredia Ferguson, PA-C   1 year ago Keratoacanthoma of thigh   [place] [facility] Malva Limes, MD   1 year ago Essential (primary) hypertension   [place] [facility] Malva Limes, MD

## 2023-03-13 ENCOUNTER — Other Ambulatory Visit: Payer: Self-pay | Admitting: Family Medicine

## 2023-03-14 NOTE — Telephone Encounter (Signed)
Requested Prescriptions  Pending Prescriptions Disp Refills   lisinopril (ZESTRIL) 40 MG tablet [Pharmacy Med Name: Lisinopril 40 MG Oral Tablet] 90 tablet 0    Sig: TAKE 1 TABLET BY MOUTH ONCE DAILY (APPOINTMENT  REQUIRED  FOR  FUTURE  REFILLS)     Cardiovascular:  ACE Inhibitors Passed - 03/13/2023 11:40 AM      Passed - Cr in normal range and within 180 days    Creat  Date Value Ref Range Status  08/09/2017 0.92 0.70 - 1.33 mg/dL Final    Comment:    For patients >73 years of age, the reference limit for Creatinine is approximately 13% higher for people identified as African-American. .    Creatinine, Ser  Date Value Ref Range Status  01/30/2023 0.90 0.76 - 1.27 mg/dL Final         Passed - K in normal range and within 180 days    Potassium  Date Value Ref Range Status  01/30/2023 3.5 3.5 - 5.2 mmol/L Final  12/26/2014 3.4 (L) mmol/L Final    Comment:    3.5-5.1 NOTE: New Reference Range  11/25/14          Passed - Patient is not pregnant      Passed - Last BP in normal range    BP Readings from Last 1 Encounters:  01/30/23 138/86         Passed - Valid encounter within last 6 months    Recent Outpatient Visits           1 month ago Annual physical exam   [facility] Malva Limes, MD   3 months ago Anxiety   [facility] Alfredia Ferguson, PA-C   1 year ago Acute non-recurrent frontal sinusitis   [facility] Alfredia Ferguson, PA-C   1 year ago Keratoacanthoma of thigh   [place] [facility] Malva Limes, MD   1 year ago Essential (primary) hypertension   [place] [facility] Malva Limes, MD

## 2023-03-22 ENCOUNTER — Other Ambulatory Visit: Payer: Self-pay | Admitting: Family Medicine

## 2023-03-22 DIAGNOSIS — F5101 Primary insomnia: Secondary | ICD-10-CM

## 2023-03-30 ENCOUNTER — Telehealth: Payer: Self-pay | Admitting: Family Medicine

## 2023-03-30 NOTE — Telephone Encounter (Signed)
Copied from CRM [id number]. Topic: General - Call Back - No Documentation >> Mar 30, 2023 10:30 AM Stephen Ray wrote: Reason for CRM: pt wants to know if Dr Sherrie Mustache will order him another A1C and lipid panel since his numbers were elevated at his last check.

## 2023-03-30 NOTE — Telephone Encounter (Signed)
Pt is calling back stating when he went to [facility] they do have an EPI PEN on file and he does not need one called in for him.

## 2023-03-30 NOTE — Telephone Encounter (Signed)
Medication Refill - Medication: EPI PEN  Has the patient contacted their pharmacy? No. Pt's insurance will not pay for the pen, but he will pay out of pocket  Preferred Pharmacy (with phone number or street name): [facility] [address] [place], [place] - [address]  Has the patient been seen for an appointment in the last year OR does the patient have an upcoming appointment? Yes.    Agent: Please be advised that RX refills may take up to 3 business days. We ask that you follow-up with your pharmacy.  Pt states he had an allergic reaction to shell fish over the July 4th holiday and swelled up in his throat.  Pt wants to keep a pen on hand in case this happens again.

## 2023-03-31 NOTE — Telephone Encounter (Signed)
Too soon to recheck labs. Need to give it 6 months for any change in diet to change numbers. He can schedule follow up appt in November and can check labs then.

## 2023-04-03 NOTE — Telephone Encounter (Signed)
 Patient advised as below.  

## 2023-04-24 ENCOUNTER — Emergency Department
Admission: EM | Admit: 2023-04-24 | Discharge: 2023-04-24 | Discharge status: Against Medical Advice | Payer: BC Managed Care – PPO | Attending: Emergency Medicine | Admitting: Emergency Medicine

## 2023-04-24 ENCOUNTER — Other Ambulatory Visit: Payer: Self-pay

## 2023-04-24 DIAGNOSIS — Z5329 Procedure and treatment not carried out because of patient's decision for other reasons: Secondary | ICD-10-CM | POA: Diagnosis not present

## 2023-04-24 DIAGNOSIS — T782XXA Anaphylactic shock, unspecified, initial encounter: Secondary | ICD-10-CM | POA: Insufficient documentation

## 2023-04-24 DIAGNOSIS — I10 Essential (primary) hypertension: Secondary | ICD-10-CM | POA: Diagnosis not present

## 2023-04-24 DIAGNOSIS — J45909 Unspecified asthma, uncomplicated: Secondary | ICD-10-CM | POA: Diagnosis not present

## 2023-04-24 DIAGNOSIS — T7840XA Allergy, unspecified, initial encounter: Secondary | ICD-10-CM | POA: Diagnosis present

## 2023-04-24 MED ORDER — DIPHENHYDRAMINE HCL 50 MG/ML IJ SOLN
25.0000 mg | Freq: Once | INTRAMUSCULAR | Status: AC
  Administered 2023-04-24: 25 mg via INTRAVENOUS
  Filled 2023-04-24: qty 1

## 2023-04-24 MED ORDER — FAMOTIDINE IN NACL 20-0.9 MG/50ML-% IV SOLN
20.0000 mg | Freq: Once | INTRAVENOUS | Status: AC
  Administered 2023-04-24: 20 mg via INTRAVENOUS
  Filled 2023-04-24: qty 50

## 2023-04-24 MED ORDER — METHYLPREDNISOLONE SODIUM SUCC 125 MG IJ SOLR
125.0000 mg | Freq: Once | INTRAMUSCULAR | Status: AC
  Administered 2023-04-24: 125 mg via INTRAVENOUS
  Filled 2023-04-24: qty 2

## 2023-04-24 MED ORDER — EPINEPHRINE 0.3 MG/0.3ML IJ SOAJ
0.3000 mg | Freq: Once | INTRAMUSCULAR | Status: AC
  Administered 2023-04-24: 0.3 mg via INTRAMUSCULAR
  Filled 2023-04-24: qty 0.3

## 2023-04-24 NOTE — ED Triage Notes (Signed)
Pt presents to ER with c/o allergic rxn.  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  Pt clearing throat in triage, and has had angioedema from same source recently.  Pt is otherwise A&O x4.    Pt took his epi-pen pta.

## 2023-04-24 NOTE — ED Provider Notes (Signed)
[facility] Provider Note    Event Date/Time   First MD Initiated Contact with Patient 04/24/23 2018     (approximate)   History   Chief Complaint Allergic Reaction   HPI  Stephen Ray is a 57 y.o. male with past medical history of hypertension, hyperlipidemia, asthma, and chronic pain syndrome who presents to the ED complaining of allergic reaction.  Patient reports that about 1 hour prior to arrival he developed itching around his throat area with a sensation that there was swelling.  He gave himself a dose of EpiPen at home and initially felt better but then had worsening difficulty breathing just prior to arrival.  He denies any associated nausea, vomiting, diarrhea, or lightheadedness.  He does report reactions to shellfish in the past requiring EpiPen, but has not had any exposure today.     Physical Exam   Triage Vital Signs: ED Triage Vitals  Encounter Vitals Group     BP 04/24/23 2015 (!) 159/140     Systolic BP Percentile --      Diastolic BP Percentile --      Pulse Rate 04/24/23 2015 90     Resp 04/24/23 2015 20     Temp 04/24/23 2015 98.5 F (36.9 C)     Temp Source 04/24/23 2015 Oral     SpO2 04/24/23 2015 96 %     Weight 04/24/23 2016 209 lb 7 oz (95 kg)     Height 04/24/23 2016 5\' 10"  (1.778 m)     Head Circumference --      Peak Flow --      Pain Score 04/24/23 2016 0     Pain Loc --      Pain Education --      Exclude from Growth Chart --     Most recent vital signs: Vitals:   04/24/23 2145 04/24/23 2152  BP:  (!) 150/96  Pulse: 71 73  Resp: 18 15  Temp:  98.4 F (36.9 C)  SpO2: 96% 97%    Constitutional: Alert and oriented. Eyes: Conjunctivae are normal. Head: Atraumatic. Nose: No congestion/rhinnorhea. Mouth/Throat: Mucous membranes are moist.  Posterior oropharynx without edema. Cardiovascular: Normal rate, regular rhythm. Grossly normal heart sounds.  2+ radial pulses bilaterally. Respiratory: Normal  respiratory effort.  No retractions. Lungs CTAB. Gastrointestinal: Soft and nontender. No distention. Musculoskeletal: No lower extremity tenderness nor edema.  Neurologic:  Normal speech and language. No gross focal neurologic deficits are appreciated.    ED Results / Procedures / Treatments   Labs (all labs ordered are listed, but only abnormal results are displayed) Labs Reviewed - No data to display   PROCEDURES:  Critical Care performed: No  Procedures   MEDICATIONS ORDERED IN ED: Medications  EPINEPHrine (EPI-PEN) injection 0.3 mg (0.3 mg Intramuscular Given 04/24/23 2033)  methylPREDNISolone sodium succinate (SOLU-MEDROL) 125 mg/2 mL injection 125 mg (125 mg Intravenous Given 04/24/23 2035)  diphenhydrAMINE (BENADRYL) injection 25 mg (25 mg Intravenous Given 04/24/23 2044)  famotidine (PEPCID) IVPB 20 mg premix (0 mg Intravenous Stopped 04/24/23 2128)     IMPRESSION / MDM / ASSESSMENT AND PLAN / ED COURSE  I reviewed the triage vital signs and the nursing notes.                              57 y.o. male with past medical history of hypertension, hyperlipidemia, asthma, and chronic pain syndrome who presents to  the ED complaining of itchy sensation around his throat along with difficulty breathing occurring about an hour prior to arrival and worsening despite EpiPen.  Patient's presentation is most consistent with acute presentation with potential threat to life or bodily function.  Differential diagnosis includes, but is not limited to, anaphylaxis, angioedema, anxiety.  Patient nontoxic-appearing and in no acute distress, vital signs are unremarkable.  He does report sensation of his throat closing with itching around his throat similar to prior allergic reactions.  He initially had improvement following dose of EpiPen but has since had worsening of symptoms, was given follow-up dose of EpiPen here in the ED.  He was also given IV Solu-Medrol, Benadryl, and Pepcid.  Symptoms  significantly improved after this and I recommended that the patient remain in the ED for 4 hours of observation.  He was adamant that he be discharged after 2 hours and we had a discussion that he would be leaving AGAINST MEDICAL ADVICE if he did so.  Patient expresses understanding of risks and wishes to sign out AGAINST MEDICAL ADVICE.  He states he has additional EpiPen's available at home, was counseled to follow-up with his PCP and to return to the ED for new or worsening symptoms.  Patient agrees with plan.      FINAL CLINICAL IMPRESSION(S) / ED DIAGNOSES   Final diagnoses:  Anaphylaxis, initial encounter     Rx / DC Orders   ED Discharge Orders     None        Note:  This document was prepared using Dragon voice recognition software and may include unintentional dictation errors.   Chesley Noon, MD 04/25/23 [phone]

## 2023-04-24 NOTE — ED Notes (Addendum)
Patient requesting to leave and does not want to stay through the recommended observation period requested by the MD. Patient and wife requested multiple times to leave and were informed of reasons to stay for duration of observation period. Patient and wife have elderly family member at home they needed to get back home to is stated as their reason to leave AMA. This RN witnessed MD Larinda Buttery speak to patients informing the patient of the benefits that might reasonably be expected from the offered services, the risks of refusing the offered services, and the risks of the offered services. The patient and/or responsible adult appears to have the capacity to understand the risks and benefits and they still wanted to go ahead with AMA. Patient verbalized understanding and any reasons to return to the ED. AMA electronic form signed at this time.

## 2023-04-25 ENCOUNTER — Telehealth: Payer: Self-pay

## 2023-04-25 DIAGNOSIS — T7802XS Anaphylactic reaction due to shellfish (crustaceans), sequela: Secondary | ICD-10-CM

## 2023-04-25 NOTE — Telephone Encounter (Signed)
Copied from CRM [id number]. Topic: General - Other >> Apr 25, 2023  1:20 PM Stephen Ray wrote: Reason for CRM: Pt wants a referral for an allergic reaction to shellfish and cicadas, he wants to see "Dr. Lossie Faes" (spelling may vary)   Located in [place] in [place] [phone]  He wants his PCP to submit the referral, he said he was an AMA leaving the Ed because he had to get home to his 57 year old mother

## 2023-04-26 NOTE — Addendum Note (Signed)
Addended by: Malva Limes on: 04/26/2023 12:47 PM   Modules accepted: Orders

## 2023-04-26 NOTE — Telephone Encounter (Signed)
Referral has been placed. Does he need refill for epi-pen for the time being?

## 2023-04-27 ENCOUNTER — Other Ambulatory Visit: Payer: Self-pay | Admitting: Family Medicine

## 2023-05-01 NOTE — Telephone Encounter (Signed)
LVMTCB. CRM created. Ok for [facility] to verify per Dr.Fisher if patient is needing refill on epi-pen for the time being

## 2023-05-08 ENCOUNTER — Ambulatory Visit: Payer: Self-pay | Admitting: *Deleted

## 2023-05-08 DIAGNOSIS — Z91013 Allergy to seafood: Secondary | ICD-10-CM

## 2023-05-08 MED ORDER — EPINEPHRINE 0.3 MG/0.3ML IJ SOAJ
0.3000 mg | INTRAMUSCULAR | 1 refills | Status: DC | PRN
Start: 2023-05-08 — End: 2024-05-14

## 2023-05-08 NOTE — Telephone Encounter (Addendum)
  Chief Complaint: Refill Epi-Pen Symptoms: Allergic reaction 0500 this AM. Used Epi-pen Frequency: This AM Pertinent Negatives: Patient denies SOB Disposition: [] ED /[] Urgent Care (no appt availability in office) / [] Appointment(In office/virtual)/ []  [facility]/ [] Home Care/ [] Refused Recommended Disposition /[] [facility] [facility]/ [x]  Follow-up with PCP Additional Notes: Pt called for refill of EpiPen. Stated tongue mildly swollen, agent sent to triage. Pt reports tongue just a little swollen on the side. No sob, no other associated symptoms. Unsure of trigger, states has appt with allergy MD 05/04/23. States was told may be cicada shells. Epi pen is not on pt's current med profile. Would like generic sent to [facility], [address]. Advised ED if occurs again. Answer Assessment - Initial Assessment Questions 1. ONSET: "When did the swelling start?" (e.g., minutes, hours, days)     Yesterday 2. LOCATION: "What part of the tongue is swollen?"     Side 3. SEVERITY: "How swollen is it?"     Mild 4. CAUSE: "What do you think is causing the tongue swelling?" (e.g., history of angioedema, allergies)     C[place]adais 5. RECURRENT SYMPTOM: "Have you had tongue swelling before?" If Yes, ask: "When was the last time?" "What happened that time?"     Yes 6. OTHER SYMPTOMS: "Do you have any other symptoms?" (e.g., breathing difficulty, facial swelling)     No  Protocols used: Tongue Swelling-A-AH

## 2023-05-08 NOTE — Telephone Encounter (Signed)
done

## 2023-05-14 ENCOUNTER — Other Ambulatory Visit: Payer: Self-pay | Admitting: Physician Assistant

## 2023-05-14 DIAGNOSIS — F419 Anxiety disorder, unspecified: Secondary | ICD-10-CM

## 2023-05-16 NOTE — Telephone Encounter (Signed)
Requested medications are due for refill today.  yes  Requested medications are on the active medications list.  unsure  Last refill. 11/22/2022 #90 1 rf  Future visit scheduled.   yes  Notes to clinic.  Rx request is for extended release. Med list does not have extended release.    Requested Prescriptions  Pending Prescriptions Disp Refills   DULoxetine (CYMBALTA) 30 MG capsule [Pharmacy Med Name: DULoxetine HCl 30 MG Oral Capsule Delayed Release Particles] 90 capsule 0    Sig: Take 1 capsule by mouth once daily     Psychiatry: Antidepressants - SNRI - duloxetine Failed - 05/14/2023  7:40 AM      Failed - Last BP in normal range    BP Readings from Last 1 Encounters:  04/24/23 (!) 150/96         Passed - Cr in normal range and within 360 days    Creat  Date Value Ref Range Status  08/09/2017 0.92 0.70 - 1.33 mg/dL Final    Comment:    For patients >75 years of age, the reference limit for Creatinine is approximately 13% higher for people identified as African-American. .    Creatinine, Ser  Date Value Ref Range Status  01/30/2023 0.90 0.76 - 1.27 mg/dL Final         Passed - eGFR is 30 or above and within 360 days    GFR, Est African American  Date Value Ref Range Status  08/09/2017 111 > OR = 60 mL/min/1.66m2 Final   GFR calc Af Amer  Date Value Ref Range Status  10/06/2020 102 >59 mL/min/1.73 Final    Comment:    **In accordance with recommendations from the NKF-ASN Task force,**   [facility] is in the process of updating its eGFR calculation to the   2021 CKD-EPI creatinine equation that estimates kidney function   without a race variable.    GFR, Est Non African American  Date Value Ref Range Status  08/09/2017 96 > OR = 60 mL/min/1.46m2 Final   GFR, Estimated  Date Value Ref Range Status  12/19/2022 >60 >60 mL/min Final    Comment:    (NOTE) Calculated using the CKD-EPI Creatinine Equation (2021)    eGFR  Date Value Ref Range Status  01/30/2023 100  >59 mL/min/1.73 Final         Passed - Completed PHQ-2 or PHQ-9 in the last 360 days      Passed - Valid encounter within last 6 months    Recent Outpatient Visits           3 months ago Annual physical exam   [facility] Malva Limes, MD   5 months ago Anxiety   [place] [facility] Alfredia Ferguson, PA-C   1 year ago Acute non-recurrent frontal sinusitis   [place] [facility] Alfredia Ferguson, PA-C   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

## 2023-05-22 ENCOUNTER — Other Ambulatory Visit: Payer: Self-pay | Admitting: Family Medicine

## 2023-05-24 NOTE — Telephone Encounter (Signed)
Requested medication (s) are due for refill today: Yes  Requested medication (s) are on the active medication list: Yes  Last refill:  04/27/23 #30, 0RF  Future visit scheduled: Yes  Notes to clinic:  Unable to refill per protocol, courtesy refill already given, routing for provider approval.      Requested Prescriptions  Pending Prescriptions Disp Refills   amLODipine (NORVASC) 10 MG tablet [Pharmacy Med Name: amLODIPine Besylate 10 MG Oral Tablet] 30 tablet 0    Sig: TAKE 1 TABLET BY MOUTH ONCE DAILY SCHEDULE  OFFICE  VISIT  FOR  FOLLOW  UP     Cardiovascular: Calcium Channel Blockers 2 Failed - 05/22/2023  9:12 AM      Failed - Last BP in normal range    BP Readings from Last 1 Encounters:  04/24/23 (!) 150/96         Passed - Last Heart Rate in normal range    Pulse Readings from Last 1 Encounters:  04/24/23 73         Passed - Valid encounter within last 6 months    Recent Outpatient Visits           3 months ago Annual physical exam   [facility] Malva Limes, MD   6 months ago Anxiety   [place] [facility] Alfredia Ferguson, PA-C   1 year ago Acute non-recurrent frontal sinusitis   [place] [facility] Alfredia Ferguson, PA-C   1 year 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

## 2023-05-25 ENCOUNTER — Other Ambulatory Visit: Payer: Self-pay | Admitting: Family Medicine

## 2023-06-12 ENCOUNTER — Other Ambulatory Visit: Payer: Self-pay | Admitting: Family Medicine

## 2023-06-13 NOTE — Telephone Encounter (Signed)
Requested Prescriptions  Pending Prescriptions Disp Refills   lisinopril (ZESTRIL) 40 MG tablet [Pharmacy Med Name: Lisinopril 40 MG Oral Tablet] 90 tablet 0    Sig: TAKE 1 TABLET BY MOUTH ONCE DAILY . APPOINTMENT REQUIRED FOR FUTURE REFILLS     Cardiovascular:  ACE Inhibitors Failed - 06/12/2023  9:09 AM      Failed - Last BP in normal range    BP Readings from Last 1 Encounters:  04/24/23 (!) 150/96         Passed - Cr in normal range and within 180 days    Creat  Date Value Ref Range Status  08/09/2017 0.92 0.70 - 1.33 mg/dL Final    Comment:    For patients >24 years of age, the reference limit for Creatinine is approximately 13% higher for people identified as African-American. .    Creatinine, Ser  Date Value Ref Range Status  01/30/2023 0.90 0.76 - 1.27 mg/dL Final         Passed - K in normal range and within 180 days    Potassium  Date Value Ref Range Status  01/30/2023 3.5 3.5 - 5.2 mmol/L Final  12/26/2014 3.4 (L) mmol/L Final    Comment:    3.5-5.1 NOTE: New Reference Range  11/25/14          Passed - Patient is not pregnant      Passed - Valid encounter within last 6 months    Recent Outpatient Visits           4 months ago Annual physical exam   [facility] Malva Limes, MD   6 months ago Anxiety   [place] [facility] Alfredia Ferguson, PA-C   1 year ago Acute non-recurrent frontal sinusitis   [place] [facility] Alfredia Ferguson, PA-C   1 year ago Keratoacanthoma of thigh   [place] [facility] Malva Limes, MD   1 year ago Essential (primary) hypertension   [place] [facility] Malva Limes, MD       Future Appointments             In 1 month Fisher, Demetrios Isaacs, MD [facility], [facility]

## 2023-06-20 ENCOUNTER — Other Ambulatory Visit: Payer: Self-pay | Admitting: Family Medicine

## 2023-06-20 NOTE — Telephone Encounter (Signed)
Requested Prescriptions  Pending Prescriptions Disp Refills   amLODipine (NORVASC) 10 MG tablet [Pharmacy Med Name: amLODIPine Besylate 10 MG Oral Tablet] 90 tablet 0    Sig: TAKE 1 TABLET BY MOUTH ONCE DAILY . APPOINTMENT REQUIRED FOR FUTURE REFILLS     Cardiovascular: Calcium Channel Blockers 2 Failed - 06/20/2023 11:19 AM      Failed - Last BP in normal range    BP Readings from Last 1 Encounters:  04/24/23 (!) 150/96         Passed - Last Heart Rate in normal range    Pulse Readings from Last 1 Encounters:  04/24/23 73         Passed - Valid encounter within last 6 months    Recent Outpatient Visits           4 months ago Annual physical exam   [facility] Malva Limes, MD   7 months ago Anxiety   [place] [facility] Alfredia Ferguson, PA-C   1 year ago Acute non-recurrent frontal sinusitis   [place] [facility] Alfredia Ferguson, PA-C   1 year ago Keratoacanthoma of thigh   [place] [facility] Malva Limes, MD   1 year ago Essential (primary) hypertension   [place] [facility] Malva Limes, MD       Future Appointments             In 1 month Fisher, Demetrios Isaacs, MD [facility], [facility]

## 2023-07-16 ENCOUNTER — Other Ambulatory Visit: Payer: Self-pay | Admitting: Family Medicine

## 2023-07-16 DIAGNOSIS — F5101 Primary insomnia: Secondary | ICD-10-CM

## 2023-08-02 ENCOUNTER — Ambulatory Visit: Payer: BC Managed Care – PPO | Admitting: Family Medicine

## 2023-08-09 ENCOUNTER — Ambulatory Visit: Payer: BC Managed Care – PPO | Admitting: Family Medicine

## 2023-08-17 ENCOUNTER — Other Ambulatory Visit: Payer: Self-pay | Admitting: Family Medicine

## 2023-08-17 DIAGNOSIS — F5101 Primary insomnia: Secondary | ICD-10-CM

## 2023-08-19 ENCOUNTER — Other Ambulatory Visit: Payer: Self-pay | Admitting: Family Medicine

## 2023-08-23 ENCOUNTER — Encounter: Payer: Self-pay | Admitting: Family Medicine

## 2023-08-23 ENCOUNTER — Ambulatory Visit: Payer: BC Managed Care – PPO | Admitting: Family Medicine

## 2023-08-23 VITALS — BP 146/92 | HR 66 | Ht 70.0 in | Wt 212.6 lb

## 2023-08-23 DIAGNOSIS — E782 Mixed hyperlipidemia: Secondary | ICD-10-CM

## 2023-08-23 DIAGNOSIS — I10 Essential (primary) hypertension: Secondary | ICD-10-CM | POA: Diagnosis not present

## 2023-08-23 DIAGNOSIS — M7551 Bursitis of right shoulder: Secondary | ICD-10-CM | POA: Diagnosis not present

## 2023-08-23 DIAGNOSIS — R7303 Prediabetes: Secondary | ICD-10-CM | POA: Diagnosis not present

## 2023-08-23 DIAGNOSIS — M7552 Bursitis of left shoulder: Secondary | ICD-10-CM

## 2023-08-23 DIAGNOSIS — Z23 Encounter for immunization: Secondary | ICD-10-CM

## 2023-08-24 LAB — LIPID PANEL WITH LDL/HDL RATIO
Cholesterol, Total: 218 mg/dL — ABNORMAL HIGH (ref 100–199)
HDL: 46 mg/dL (ref >39)
LDL Chol Calc (NIH): 119 mg/dL — ABNORMAL HIGH (ref 0–99)
LDL/HDL Ratio: 2.6 {ratio} (ref 0.0–3.6)
Triglycerides: 302 mg/dL — ABNORMAL HIGH (ref 0–149)
VLDL Cholesterol Cal: 53 mg/dL — ABNORMAL HIGH (ref 5–40)

## 2023-08-24 LAB — HEMOGLOBIN A1C
Est. average glucose Bld gHb Est-mCnc: 117 mg/dL
Hgb A1c MFr Bld: 5.7 % — ABNORMAL HIGH (ref 4.8–5.6)

## 2023-08-24 LAB — COMPREHENSIVE METABOLIC PANEL
ALT: 57 [IU]/L — ABNORMAL HIGH (ref 0–44)
AST: 31 [IU]/L (ref 0–40)
Albumin: 4.4 g/dL (ref 3.8–4.9)
Alkaline Phosphatase: 94 [IU]/L (ref 44–121)
BUN/Creatinine Ratio: 11 (ref 9–20)
BUN: 11 mg/dL (ref 6–24)
Bilirubin Total: 0.5 mg/dL (ref 0.0–1.2)
CO2: 25 mmol/L (ref 20–29)
Calcium: 9.8 mg/dL (ref 8.7–10.2)
Chloride: 100 mmol/L (ref 96–106)
Creatinine, Ser: 1 mg/dL (ref 0.76–1.27)
Globulin, Total: 2.2 g/dL (ref 1.5–4.5)
Glucose: 108 mg/dL — ABNORMAL HIGH (ref 70–99)
Potassium: 3.7 mmol/L (ref 3.5–5.2)
Sodium: 140 mmol/L (ref 134–144)
Total Protein: 6.6 g/dL (ref 6.0–8.5)
eGFR: 88 mL/min/{1.73_m2} (ref >59)

## 2023-08-27 ENCOUNTER — Other Ambulatory Visit: Payer: Self-pay | Admitting: Family Medicine

## 2023-08-27 DIAGNOSIS — E782 Mixed hyperlipidemia: Secondary | ICD-10-CM

## 2023-08-27 MED ORDER — ROSUVASTATIN CALCIUM 5 MG PO TABS
5.0000 mg | ORAL_TABLET | Freq: Every day | ORAL | 1 refills | Status: DC
Start: 2023-08-27 — End: 2024-02-14

## 2023-09-05 NOTE — Progress Notes (Signed)
Established patient visit   Patient: Stephen Ray   DOB: [birth year]   57 y.o. Male  MRN: [id number] Visit Date: 08/23/2023  Today's healthcare provider: Mila Merry, MD   Chief Complaint  Patient presents with   Medical Management of Chronic Issues    6 month follow-up. Patient reports taking medications as prescribed. Patient is fasting. Current bilateral shoulder pain at 6/10. Has taken 2 tylenol and 2 ibuprofen this morning   Hypertension    Avg home readings 130s/80-90s   Hyperlipidemia   Anxiety    Improved as long as he has taken rx. Can tell if he misses a dose   Insomnia    Great with rx   Prediabetes    No current treatment.    Subjective    Discussed the use of AI scribe software for clinical note transcription with the patient, who gave verbal consent to proceed.  History of Present Illness   The patient, with a history of hypertension, hyperlipidemia, and diabetes, presents for a routine checkup. He reports persistent shoulder pain, which he attributes to the cold weather and his prosthetics. He manages the pain with over-the-counter analgesics, including Advil and Tylenol.  The patient monitors his blood pressure at home, which typically ranges from 130s/85-90s. He is on amlodipine and lisinopril for hypertension. He also reports a recent decrease in physical activity due to a partial knee replacement and the holiday season. Despite this, he has lost weight since his last visit.  His blood sugar was slightly elevated at his last visit. He admits to not being very disciplined with his diet, especially during the holiday season. He enjoys hard candies but does not consume sweet beverages, preferring water or sugar-free soda. He had a small amount of coffee with nonfat creamer on the day of the visit.  The patient denies any chest pain, heart flutters, or shortness of breath. He is on Cymbalta, which he reports is working well for him. He has an upcoming  allergist appointment at [facility] due to recent reactions to hard shell seafood. He has not had any falls or injuries recently.  The patient's main concerns are his cholesterol and A1c levels. He was previously recommended to start a statin but opted to change his diet instead. He acknowledges some dietary indiscretions over the holidays.       Medications: Outpatient Medications Prior to Visit  Medication Sig   amLODipine (NORVASC) 10 MG tablet TAKE 1 TABLET BY MOUTH ONCE DAILY . APPOINTMENT REQUIRED FOR FUTURE REFILLS   DULoxetine (CYMBALTA) 30 MG capsule Take 1 capsule by mouth once daily   EPINEPHrine (EPIPEN 2-PAK) 0.3 mg/0.3 mL IJ SOAJ injection Inject 0.3 mg into the muscle as needed for anaphylaxis.   gabapentin (NEURONTIN) 300 MG capsule TAKE 1 CAPSULE BY MOUTH TWICE DAILY AND 2 AT BEDTIME   lisinopril (ZESTRIL) 40 MG tablet TAKE 1 TABLET BY MOUTH ONCE DAILY . APPOINTMENT REQUIRED FOR FUTURE REFILLS   loratadine (CLARITIN) 10 MG tablet Take 1 tablet (10 mg total) by mouth daily as needed.   traZODone (DESYREL) 100 MG tablet TAKE 1/2 TO 1 (ONE-HALF TO ONE) TABLET BY MOUTH AT BEDTIME AS NEEDED FOR SLEEP   No facility-administered medications prior to visit.   (optional):1}   Objective    BP (!) 146/92 (BP Location: Right Arm, Patient Position: Sitting)   Pulse 66   Ht 5\' 10"  (1.778 m)   Wt 212 lb 9.6 oz (96.4 kg)   SpO2 97%  BMI 30.50 kg/m   Physical Exam   General: Appearance:    Mildly obese male in no acute distress  Eyes:    PERRL, conjunctiva/corneas clear, EOM's intact       Lungs:     Clear to auscultation bilaterally, respirations unlabored  Heart:    Normal heart rate. Normal rhythm. No murmurs, rubs, or gallops.    MS:   All extremities are intact.  FROM both shoulders. Tender along insertion of head of biceps and trapezius. No gross deformities.   Neurologic:   Awake, alert, oriented x 3. No apparent focal neurological defect.             Assessment & Plan        Hypertension Persistent elevated blood pressure at home (130s/85-90s) despite adherence to Amlodipine and Lisinopril. Recent decrease in physical activity due to partial knee replacement and dietary indiscretions over the holidays. -Continue current antihypertensive medications. -Schedule office visit to assess blood pressure and discuss possible medication adjustments.  Hyperglycemia Previous elevated blood sugar. Recent dietary indiscretions over the holidays including consumption of sweets and sweet drinks. -Order labs to check current blood glucose and A1C levels. -Discuss results and possible management changes at upcoming office visit.  Shoulder Pain Acute exacerbation likely due to cold weather. Self-managed with Advil and Tylenol. -Continue current management as needed.  Hyperlipidemia Previous elevated cholesterol managed with dietary changes. -Check lipid panel with other labs.  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 Appointment scheduled at [facility] for further evaluation. -Continue avoidance of hard shell seafood until allergy evaluation.  Depression/Anxiety Well controlled on Cymbalta. -Continue Cymbalta as prescribed.  General Health Maintenance -Continue monitoring blood pressure at home. -Encourage return to regular physical activity as tolerated post knee replacement. -Encourage adherence to a balanced diet, limiting sweets and sweet drinks.         Mila Merry, MD  [facility] [phone] (phone) [phone] (fax)  [facility]

## 2023-09-14 ENCOUNTER — Other Ambulatory Visit: Payer: Self-pay | Admitting: Family Medicine

## 2023-09-15 ENCOUNTER — Other Ambulatory Visit: Payer: Self-pay | Admitting: Family Medicine

## 2023-09-15 DIAGNOSIS — F5101 Primary insomnia: Secondary | ICD-10-CM

## 2023-09-18 NOTE — Telephone Encounter (Signed)
Please advise 

## 2023-09-19 ENCOUNTER — Other Ambulatory Visit: Payer: Self-pay | Admitting: Family Medicine

## 2023-10-06 ENCOUNTER — Telehealth: Payer: Self-pay

## 2023-10-06 NOTE — Telephone Encounter (Signed)
Copied from CRM [id number]. Topic: General - Other >> Oct 06, 2023 12:01 PM Clide Dales wrote: Patient is requesting a callback from Dr. Sherrie Mustache.

## 2023-10-08 NOTE — Telephone Encounter (Signed)
I don't know what that means

## 2023-10-09 NOTE — Telephone Encounter (Signed)
Talked to patient states he doing a detox no caffeine no sugar no processed foods and just wanted to give a [place]

## 2023-11-08 ENCOUNTER — Other Ambulatory Visit: Payer: Self-pay | Admitting: Family Medicine

## 2023-11-08 DIAGNOSIS — F419 Anxiety disorder, unspecified: Secondary | ICD-10-CM

## 2023-11-18 ENCOUNTER — Other Ambulatory Visit: Payer: Self-pay | Admitting: Family Medicine

## 2023-12-04 ENCOUNTER — Telehealth: Payer: Self-pay

## 2023-12-04 ENCOUNTER — Ambulatory Visit: Payer: Self-pay | Admitting: Family Medicine

## 2023-12-04 DIAGNOSIS — E782 Mixed hyperlipidemia: Secondary | ICD-10-CM

## 2023-12-04 NOTE — Telephone Encounter (Signed)
 Copied from CRM [id number]. Topic: Appointments - Appointment Cancel/Reschedule >> Dec 04, 2023  8:31 AM Truddie Crumble wrote: Patient/patient representative is calling to cancel or reschedule an appointment. Refer to attachments for appointment information. Patient called stating the doctor was supposed to check his lipid panel today but there are no orders in so he rescheduled the appointment. Patient want to be called when the orders are in so he can get it done

## 2023-12-04 NOTE — Telephone Encounter (Signed)
 Orders have been placed, should be fasting.

## 2023-12-04 NOTE — Addendum Note (Signed)
 Addended by: Malva Limes on: 12/04/2023 05:12 PM   Modules accepted: Orders

## 2023-12-05 NOTE — Telephone Encounter (Signed)
 Patient advised.

## 2023-12-08 ENCOUNTER — Other Ambulatory Visit: Payer: Self-pay | Admitting: Family Medicine

## 2023-12-08 LAB — LIPID PANEL
Chol/HDL Ratio: 2.9 ratio (ref 0.0–5.0)
Cholesterol, Total: 144 mg/dL (ref 100–199)
HDL: 49 mg/dL (ref >39)
LDL Chol Calc (NIH): 75 mg/dL (ref 0–99)
Triglycerides: 112 mg/dL (ref 0–149)
VLDL Cholesterol Cal: 20 mg/dL (ref 5–40)

## 2023-12-08 LAB — HEPATIC FUNCTION PANEL
ALT: 33 IU/L (ref 0–44)
AST: 27 IU/L (ref 0–40)
Albumin: 4.5 g/dL (ref 3.8–4.9)
Alkaline Phosphatase: 91 IU/L (ref 44–121)
Bilirubin Total: 0.4 mg/dL (ref 0.0–1.2)
Bilirubin, Direct: 0.14 mg/dL (ref 0.00–0.40)
Total Protein: 6.6 g/dL (ref 6.0–8.5)

## 2023-12-18 ENCOUNTER — Ambulatory Visit: Admitting: Family Medicine

## 2024-02-14 ENCOUNTER — Other Ambulatory Visit: Payer: Self-pay | Admitting: Family Medicine

## 2024-02-14 DIAGNOSIS — E782 Mixed hyperlipidemia: Secondary | ICD-10-CM

## 2024-03-06 ENCOUNTER — Other Ambulatory Visit: Payer: Self-pay | Admitting: Family Medicine

## 2024-03-06 DIAGNOSIS — F5101 Primary insomnia: Secondary | ICD-10-CM

## 2024-03-07 ENCOUNTER — Other Ambulatory Visit: Payer: Self-pay | Admitting: Family Medicine

## 2024-03-12 ENCOUNTER — Other Ambulatory Visit: Payer: Self-pay | Admitting: Family Medicine

## 2024-03-12 ENCOUNTER — Ambulatory Visit (INDEPENDENT_AMBULATORY_CARE_PROVIDER_SITE_OTHER): Payer: PRIVATE HEALTH INSURANCE | Admitting: Family Medicine

## 2024-03-12 ENCOUNTER — Encounter: Payer: Self-pay | Admitting: Family Medicine

## 2024-03-12 VITALS — BP 132/83 | HR 75 | Temp 97.9°F | Ht 70.0 in | Wt 204.1 lb

## 2024-03-12 DIAGNOSIS — R7303 Prediabetes: Secondary | ICD-10-CM

## 2024-03-12 DIAGNOSIS — I10 Essential (primary) hypertension: Secondary | ICD-10-CM | POA: Diagnosis not present

## 2024-03-12 DIAGNOSIS — F419 Anxiety disorder, unspecified: Secondary | ICD-10-CM

## 2024-03-12 MED ORDER — LISINOPRIL 40 MG PO TABS: 40.0000 mg | ORAL_TABLET | Freq: Every day | ORAL | 1 refills | Status: AC

## 2024-03-12 MED ORDER — AMLODIPINE BESYLATE 10 MG PO TABS: 10.0000 mg | ORAL_TABLET | Freq: Every day | ORAL | 1 refills | Status: DC

## 2024-03-12 MED ORDER — DULOXETINE HCL 20 MG PO CPEP: ORAL_CAPSULE | ORAL | 0 refills | Status: AC

## 2024-03-12 NOTE — Progress Notes (Signed)
 Established patient visit   Patient: Stephen Ray   DOB: December 12, 1965   58 y.o. Male  MRN: [id number] Visit Date: 03/12/2024  Today's healthcare provider: Jon Eva, MD   PCP: Gasper Nancyann BRAVO, MD   Chief Complaint  Patient presents with   Medical Management of Chronic Issues    HTN and Neuropathy   Subjective    HPI HPI     Medical Management of Chronic Issues    Additional comments: HTN and Neuropathy      Last edited by Rosas, Joseline E, CMA on 03/12/2024 11:29 AM.       Discussed the use of AI scribe software for clinical note transcription with the patient, who gave verbal consent to proceed.  History of Present Illness   Stephen Ray is a 58 year old male who presents for medication refills and follow-up.  He is concerned about his blood pressure, with recent readings being normal or lower than usual. He has lost seventeen pounds since January and is interested in reducing his medication dosage. His current medications include amlodipine  10 mg and lisinopril  40 mg. He attributes the improvement in blood pressure to weight loss and increased physical activity, achieving about fifteen thousand steps a day.  He takes duloxetine  30 mg for stress and irritability, started after a divorce in 2012. He is interested in tapering off this medication and inquires about a lower dose. Duloxetine  helps manage his mood, especially in stressful situations, and he notices when he misses a dose.  He takes gabapentin  for nerve pain in his arm, which has a calming effect. He recently refilled this medication without issues.  He has managed elevated cholesterol through lifestyle changes, such as eliminating cheese and ice cream from his diet, and did not take previously prescribed medication. He has a history of asthma, developed at age 89, with no current issues.        Medications: Outpatient Medications Prior to Visit  Medication Sig   EPINEPHrine  (EPIPEN   2-PAK) 0.3 mg/0.3 mL IJ SOAJ injection Inject 0.3 mg into the muscle as needed for anaphylaxis.   gabapentin  (NEURONTIN ) 300 MG capsule TAKE 1 CAPSULE BY MOUTH TWICE DAILY AND 2 AT BEDTIME   traZODone  (DESYREL ) 100 MG tablet TAKE 1/2 TO 1 (ONE-HALF TO ONE) TABLET BY MOUTH AT BEDTIME AS NEEDED FOR SLEEP   [DISCONTINUED] amLODipine  (NORVASC ) 10 MG tablet TAKE 1 TABLET BY MOUTH ONCE DAILY (APPOINTMENT  REQUIRED  FOR  FUTURE  REFILLS)   [DISCONTINUED] DULoxetine  (CYMBALTA ) 30 MG capsule Take 1 capsule by mouth once daily   [DISCONTINUED] lisinopril  (ZESTRIL ) 40 MG tablet TAKE 1 TABLET BY MOUTH ONCE DAILY (APPOINTMENT  REQUIRED  FOR  FURTHER  REFILLS)   [DISCONTINUED] loratadine  (CLARITIN ) 10 MG tablet Take 1 tablet (10 mg total) by mouth daily as needed.   [DISCONTINUED] rosuvastatin  (CRESTOR ) 5 MG tablet Take 1 tablet by mouth once daily   No facility-administered medications prior to visit.    Review of Systems     Objective    BP 132/83 (BP Location: Left Arm, Patient Position: Sitting, Cuff Size: Normal)   Pulse 75   Temp 97.9 F (36.6 C) (Oral)   Ht 5' 10 (1.778 m)   Wt 204 lb 1.6 oz (92.6 kg)   SpO2 97%   BMI 29.29 kg/m    Physical Exam Vitals reviewed.  Constitutional:      General: He is not in acute distress.    Appearance: Normal appearance.  He is not diaphoretic.  HENT:     Head: Normocephalic and atraumatic.   Eyes:     General: No scleral icterus.    Conjunctiva/sclera: Conjunctivae normal.    Cardiovascular:     Rate and Rhythm: Normal rate and regular rhythm.     Heart sounds: Normal heart sounds. No murmur heard. Pulmonary:     Effort: Pulmonary effort is normal. No respiratory distress.     Breath sounds: Normal breath sounds. No wheezing or rhonchi.   Musculoskeletal:     Cervical back: Neck supple.     Right lower leg: No edema.     Left lower leg: No edema.  Lymphadenopathy:     Cervical: No cervical adenopathy.   Skin:    General: Skin is warm  and dry.     Findings: No rash.   Neurological:     Mental Status: He is alert and oriented to person, place, and time. Mental status is at baseline.   Psychiatric:        Mood and Affect: Mood normal.        Behavior: Behavior normal.      No results found for any visits on 03/12/24.  Assessment & Plan     Problem List Items Addressed This Visit       Cardiovascular and Mediastinum   Essential (primary) hypertension - Primary   Relevant Medications   amLODipine  (NORVASC ) 10 MG tablet   lisinopril  (ZESTRIL ) 40 MG tablet   Other Relevant Orders   Basic Metabolic Panel (BMET)     Other   Anxiety   Relevant Medications   DULoxetine  (CYMBALTA ) 20 MG capsule   Prediabetes   Relevant Orders   Hemoglobin A1c        Hypertension Blood pressure is well-controlled on current regimen. Recent weight loss of 17 pounds since January may contribute to improved control. He is active, achieving 15,000 steps daily, and is mindful of hydration, especially in heat. Current medications include amlodipine  and lisinopril , effectively managing his condition. - Continue amlodipine  10 mg daily - Continue lisinopril  40 mg daily - Monitor blood pressure regularly at home - Advise to maintain hydration, especially in hot weather - Schedule follow-up in six months for routine monitoring  Anxiety/Irritability Managed with duloxetine  30 mg daily, initially prescribed following a divorce in 2012. He expresses interest in tapering off duloxetine . No current depressive symptoms reported, but acknowledges edginess when not taking medication. Gabapentin  may provide additional calming effect. Plan to taper duloxetine  to assess if he can manage without it. - Initiate taper of duloxetine : 20 mg daily for two weeks, then 20 mg every other day for two weeks, then discontinue - Monitor for increased depressive symptoms or edginess during taper - Advise to contact clinic if experiencing difficulty with  taper  Neuropathy Managed with gabapentin , which also provides a calming effect. He reports adequate refills of gabapentin . - Continue current gabapentin  regimen - Ensure adequate refills of gabapentin   General Health Maintenance Recent labs in March showed improved cholesterol levels and normal liver function without cholesterol medication. A1c, kidney function, and electrolytes need monitoring due to weight loss and medication use. - Order labs for A1c, kidney function, and electrolytes - No need for fasting for these labs - Schedule physical with Doctor Gasper in six months - Ref[facility] medications at [facility] [address]       Return in about 6 months (around 09/11/2024) for CPE, With PCP.       Jimel Myler,  MD  [facility] [phone] (phone) [phone] (fax)  [facility]

## 2024-05-10 ENCOUNTER — Other Ambulatory Visit: Payer: Self-pay | Admitting: Family Medicine

## 2024-05-10 DIAGNOSIS — Z91013 Allergy to seafood: Secondary | ICD-10-CM

## 2024-06-05 ENCOUNTER — Other Ambulatory Visit: Payer: Self-pay | Admitting: Family Medicine

## 2024-06-05 DIAGNOSIS — Z91013 Allergy to seafood: Secondary | ICD-10-CM

## 2024-06-05 NOTE — Telephone Encounter (Unsigned)
 Copied from CRM #[id number]. Topic: Clinical - Medication Refill >> Jun 05, 2024  8:09 AM Cynthia K wrote: Medication: EPINEPHRINE  0.3 mg/0.3 mL IJ SOAJ injection  Has the patient contacted their pharmacy? Yes (Agent: If no, request that the patient contact the pharmacy for the refill. If patient does not wish to contact the pharmacy document the reason why and proceed with request.) (Agent: If yes, when and what did the pharmacy advise?) Pharmacy needs order to refill  This is the patient's preferred pharmacy:  [facility] [address], [place] - [address] [place] [address] Phone: [phone] Fax: [phone]  Is this the correct pharmacy for this prescription? Yes If no, delete pharmacy and type the correct one.   Has the prescription been filled recently? No  Is the patient out of the medication? Yes  Has the patient been seen for an appointment in the last year OR does the patient have an upcoming appointment? No  Can we respond through MyChart? Yes  Agent: Please be advised that Rx refills may take up to 3 business days. We ask that you follow-up with your pharmacy.

## 2024-06-06 MED ORDER — EPINEPHRINE 0.3 MG/0.3ML IJ SOAJ: 0.3000 mg | INTRAMUSCULAR | 0 refills | Status: AC | PRN

## 2024-06-06 NOTE — Telephone Encounter (Signed)
 Requested Prescriptions  Pending Prescriptions Disp Refills   EPINEPHrine  0.3 mg/0.3 mL IJ SOAJ injection 2 each 0    Sig: Inject 0.3 mg into the muscle as needed for anaphylaxis.     Immunology: Antidotes Passed - 06/06/2024 12:21 PM      Passed - Valid encounter within last 12 months    Recent Outpatient Visits           2 months ago Essential (primary) hypertension    [facility] [place], Jon HERO, MD

## 2024-06-14 ENCOUNTER — Other Ambulatory Visit: Payer: Self-pay | Admitting: Family Medicine

## 2024-06-15 LAB — BASIC METABOLIC PANEL WITH GFR
BUN/Creatinine Ratio: 12 (ref 9–20)
BUN: 11 mg/dL (ref 6–24)
CO2: 24 mmol/L (ref 20–29)
Calcium: 9.3 mg/dL (ref 8.7–10.2)
Chloride: 102 mmol/L (ref 96–106)
Creatinine, Ser: 0.92 mg/dL (ref 0.76–1.27)
Glucose: 104 mg/dL — ABNORMAL HIGH (ref 70–99)
Potassium: 3.7 mmol/L (ref 3.5–5.2)
Sodium: 139 mmol/L (ref 134–144)
eGFR: 96 mL/min/1.73 (ref >59)

## 2024-06-15 LAB — HEMOGLOBIN A1C
Est. average glucose Bld gHb Est-mCnc: 105 mg/dL
Hgb A1c MFr Bld: 5.3 % (ref 4.8–5.6)

## 2024-06-17 ENCOUNTER — Ambulatory Visit: Payer: Self-pay | Admitting: Family Medicine

## 2024-06-18 NOTE — Telephone Encounter (Signed)
 Yes that's right.  He seems content, so I'm not sure that we need to respond to the last message.

## 2024-07-02 ENCOUNTER — Other Ambulatory Visit: Payer: Self-pay | Admitting: Family Medicine

## 2024-07-02 DIAGNOSIS — F5101 Primary insomnia: Secondary | ICD-10-CM

## 2024-08-09 ENCOUNTER — Telehealth: Payer: Self-pay

## 2024-08-09 NOTE — Telephone Encounter (Unsigned)
 Copied from CRM [id number]. Topic: Referral - Request for Referral >> Aug 09, 2024  1:30 PM [name] wrote: Did the patient discuss referral with their provider in the last year? No (If No - schedule appointment) (If Yes - send message)  Appointment offered? No  Type of order/referral and detailed reason for visit: Urology, kidney stones   Preference of office, provider, location: [facility] - Dr. Alm FALCON. Mitch, MD, MPH   If referral order, have you been seen by this specialty before? Yes (If Yes, this issue or another issue? When? Where?  Can we respond through MyChart? No

## 2024-08-11 NOTE — Telephone Encounter (Signed)
 Last had kidney stones in 2014. If he thinks he has kidney stones again then he needs to be seen for evaluation and to document medical necessity for referral.

## 2024-08-12 NOTE — Telephone Encounter (Signed)
 Patient advised. Per patient he will figure it out. Feels like there's no need to come pay here and pay at the urologist.

## 2024-09-20 ENCOUNTER — Other Ambulatory Visit: Payer: Self-pay

## 2024-09-24 ENCOUNTER — Telehealth: Payer: Self-pay

## 2024-09-24 NOTE — Telephone Encounter (Signed)
 Responded through [place]   Copied from CRM [id number]. Topic: Referral - Request for Referral >> Sep 20, 2024 11:23 AM Amy B wrote: Did the patient discuss referral with their provider in the last year? No (If No - schedule appointment) (If Yes - send message)  Appointment offered? No  Type of order/referral and detailed reason for visit: Urology  Preference of office, provider, location: [facility]  If referral order, have you been seen by this specialty before? Yes (If Yes, this issue or another issue? When? Where?  Can we respond through MyChart? Yes

## 2024-10-01 ENCOUNTER — Encounter: Payer: Self-pay | Admitting: Family Medicine

## 2024-10-01 ENCOUNTER — Telehealth: Admitting: Family Medicine

## 2024-10-01 VITALS — BP 130/96

## 2024-10-01 DIAGNOSIS — N200 Calculus of kidney: Secondary | ICD-10-CM | POA: Diagnosis not present

## 2024-10-01 NOTE — Progress Notes (Signed)
" ° ° °  MyChart Video Visit    Virtual Visit via Video Note   This format is felt to be most appropriate for this patient at this time. Physical exam was limited by quality of the video and audio technology used for the visit.    Patient location: home Provider location: [facility] Persons involved in the visit: patient, provider  I discussed the limitations of evaluation and management by telemedicine and the availability of in person appointments. The patient expressed understanding and agreed to proceed.  Patient: Stephen Ray   DOB: November 03, 1965   59 y.o. Male  MRN: [id number] Visit Date: 10/01/2024  Today's healthcare provider: Jon Eva, MD   No chief complaint on file.  Subjective    HPI   Patient reports h/o recurrent kidney stones (over 52 since age 34)  Wants to see Dr Mitch at [facility] in [place] - wants to [place] Was previously seen by a different urologist  Had 2 back to back recently - saved the stones  Drinking more bottled water  as well water  minerals were contributing previously. Was told he had some residuals in the kidney.  No hematuria. Occasional flank pain.\  H/I lithotripsy in 2004 ish  Review of Systems      Objective    BP (!) 130/96 Comment: home reading      Physical Exam Constitutional:      General: He is not in acute distress.    Appearance: Normal appearance. He is not diaphoretic.  HENT:     Head: Normocephalic.  Eyes:     Conjunctiva/sclera: Conjunctivae normal.  Pulmonary:     Effort: Pulmonary effort is normal. No respiratory distress.  Neurological:     Mental Status: He is alert and oriented to person, place, and time. Mental status is at baseline.       Assessment & Plan     Problem List Items Addressed This Visit       Genitourinary   Recurrent nephrolithiasis - Primary   Relevant Orders   Ambulatory referral to Urology   History of recurrent nephrolithiasis No  current pain or hematuria Wants to get plugged into urology for ongoing management Referral placed today   No orders of the defined types were placed in this encounter.    Return in about 2 months (around 11/29/2024) for CPE, With PCP.     I discussed the assessment and treatment plan with the patient. The patient was provided an opportunity to ask questions and all were answered. The patient agreed with the plan and demonstrated an understanding of the instructions.   The patient was advised to call back or seek an in-person evaluation if the symptoms worsen or if the condition fails to improve as anticipated.   Jon Eva, MD [facility] [phone] (phone) [phone] (fax)  [facility]   "

## 2024-10-03 ENCOUNTER — Telehealth: Payer: Self-pay

## 2024-10-03 NOTE — Telephone Encounter (Signed)
 Copied from CRM #[id number]. Topic: Referral - Request for Referral >> Oct 03, 2024 10:35 AM Olam RAMAN wrote: Did the patient discuss referral with their provider in the last year? Yes (If No - schedule appointment) (If Yes - send message)  Appointment offered? No  Type of order/referral and detailed reason for visit: Kidney stones  Preference of office, provider, location: [facility]  If referral order, have you been seen by this specialty before? No (If Yes, this issue or another issue? When? Where?  Can we respond through MyChart? Yes  Ref for [facility] for kidney stones and said they need images referred by provider. Need a ref to images

## 2024-10-04 ENCOUNTER — Other Ambulatory Visit: Payer: Self-pay | Admitting: Family Medicine

## 2024-10-04 ENCOUNTER — Telehealth: Payer: Self-pay

## 2024-10-04 DIAGNOSIS — R1085 Abdominal pain of multiple sites: Secondary | ICD-10-CM

## 2024-10-04 NOTE — Telephone Encounter (Signed)
 Copied from CRM [id number]. Topic: Clinical - Request for Lab/Test Order >> Oct 04, 2024  9:45 AM Larissa RAMAN wrote: Reason for CRM: Patient requesting imaging for kidney stones, states this is being requested by urology.

## 2024-10-04 NOTE — Progress Notes (Signed)
 Order placed for renal stone CT

## 2024-10-04 NOTE — Telephone Encounter (Signed)
 Have ordered renal stone ct

## 2024-10-04 NOTE — Telephone Encounter (Signed)
 This pt states that he needs a new image showing kidney stones or a new image of that area.

## 2024-10-04 NOTE — Telephone Encounter (Signed)
 the only one's we have a record of are the CT abd/pelvis 03/07/2013 and 02/20/2008.  per Dr. Rona note, it looks like he has been followed by another urologist who might have more recent records, but doesn't say which urologist that is.

## 2024-10-04 NOTE — Telephone Encounter (Signed)
 There are two encounters on this.  This pt states that he needs a new image showing kidney stones or a new image of that area.

## 2024-10-04 NOTE — Telephone Encounter (Signed)
 Do you have a report on pt kidney stones? If so where can I find it.

## 2024-10-08 ENCOUNTER — Telehealth: Payer: Self-pay | Admitting: Family Medicine

## 2024-10-08 DIAGNOSIS — R1085 Abdominal pain of multiple sites: Secondary | ICD-10-CM

## 2024-10-08 DIAGNOSIS — N200 Calculus of kidney: Secondary | ICD-10-CM

## 2024-10-08 NOTE — Telephone Encounter (Signed)
 Patient does not want to have a CT done for kidney stones, He wants to have an ultrasound ordered at [facility] due to insurance.   Please advise.

## 2024-10-08 NOTE — Addendum Note (Signed)
 Addended by: GASPER NANCYANN BRAVO on: 10/08/2024 11:27 AM   Modules accepted: Orders

## 2024-10-10 ENCOUNTER — Ambulatory Visit

## 2024-10-17 ENCOUNTER — Ambulatory Visit

## 2024-10-18 ENCOUNTER — Other Ambulatory Visit: Payer: Self-pay | Admitting: Family Medicine

## 2024-10-22 ENCOUNTER — Other Ambulatory Visit: Payer: Self-pay | Admitting: Family Medicine

## 2024-10-22 DIAGNOSIS — F5101 Primary insomnia: Secondary | ICD-10-CM
